# Patient Record
Sex: Male | Born: 1980 | Race: Black or African American | Hispanic: No | Marital: Married | Smoking: Never smoker
Health system: Southern US, Community
[De-identification: ages and names within clinical notes are randomized; demographics above are authoritative.]

## PROBLEM LIST (undated history)

## (undated) DIAGNOSIS — I442 Atrioventricular block, complete: Secondary | ICD-10-CM

## (undated) DIAGNOSIS — G4733 Obstructive sleep apnea (adult) (pediatric): Secondary | ICD-10-CM

## (undated) DIAGNOSIS — Z9889 Other specified postprocedural states: Secondary | ICD-10-CM

## (undated) DIAGNOSIS — G4730 Sleep apnea, unspecified: Secondary | ICD-10-CM

## (undated) DIAGNOSIS — I10 Essential (primary) hypertension: Secondary | ICD-10-CM

## (undated) DIAGNOSIS — I639 Cerebral infarction, unspecified: Secondary | ICD-10-CM

## (undated) HISTORY — PX: FRACTURE SURGERY: SHX138

## (undated) HISTORY — DX: Cerebral infarction, unspecified: I63.9

## (undated) HISTORY — DX: Obstructive sleep apnea (adult) (pediatric): G47.33

## (undated) HISTORY — DX: Sleep apnea, unspecified: G47.30

## (undated) HISTORY — PX: KNEE SURGERY: SHX244

## (undated) HISTORY — DX: Atrioventricular block, complete: I44.2

## (undated) HISTORY — PX: HAND SURGERY: SHX662

---

## 2008-05-10 ENCOUNTER — Inpatient Hospital Stay (HOSPITAL_COMMUNITY): Admission: EM | Admit: 2008-05-10 | Discharge: 2008-05-13 | Payer: Self-pay | Admitting: Emergency Medicine

## 2008-05-10 IMAGING — CR DG HAND COMPLETE 3+V*R*
4 series · 4 of 4 positions shown · non-contrast
Comparison: Right forearm series from the same day.

CLINICAL DATA: 27-year-old male with crush injury to the right
forearm and hand.

RIGHT HAND - COMPLETE 3+ VIEW

[view not recorded (1 of 4)]
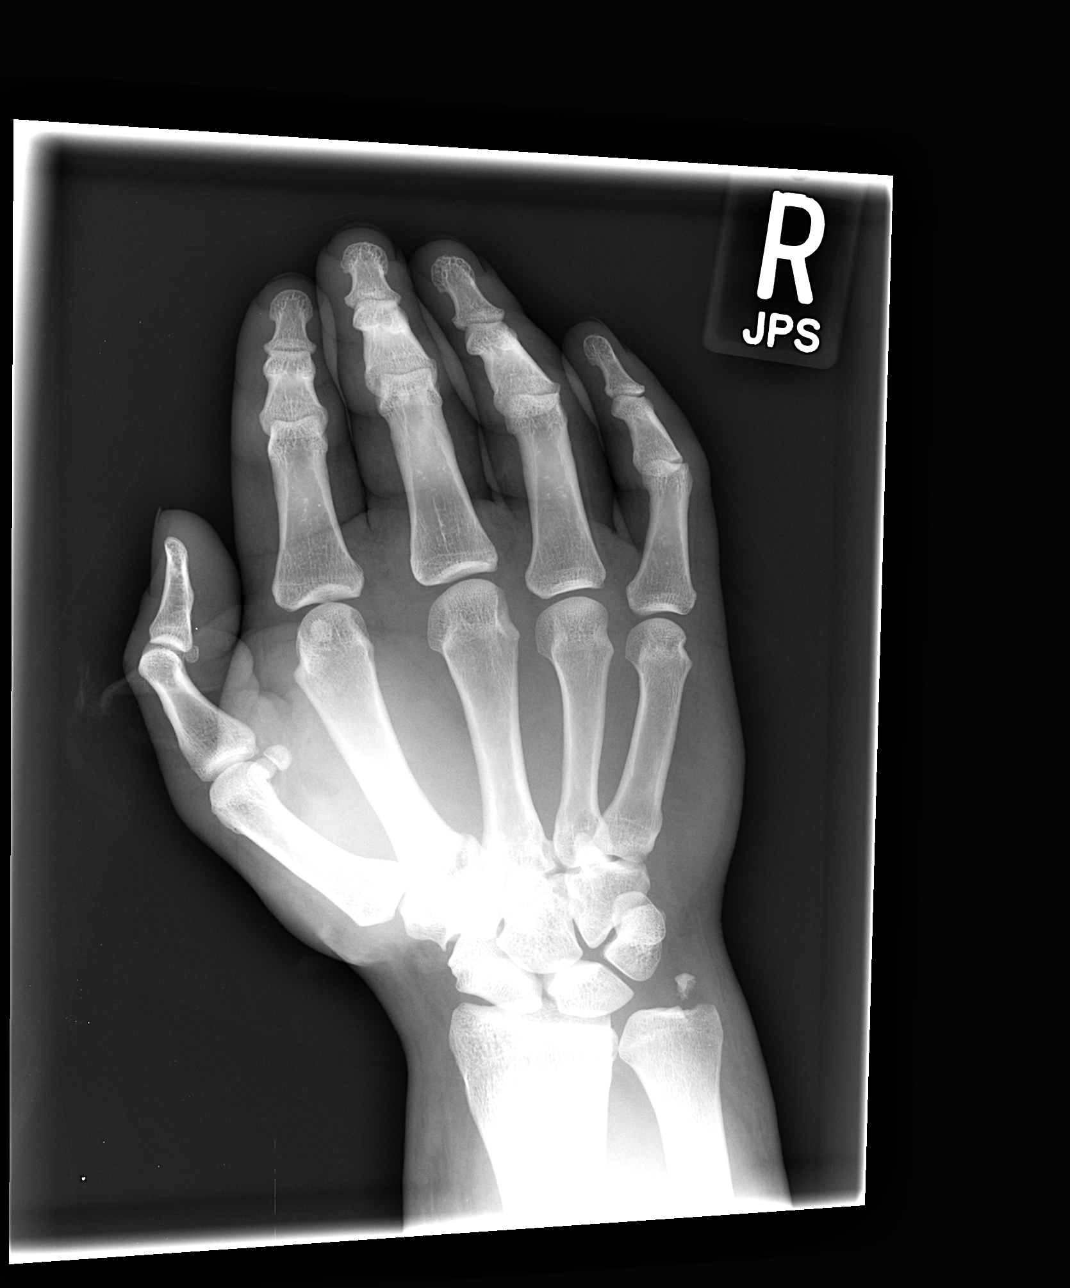

[view not recorded (2 of 4)]
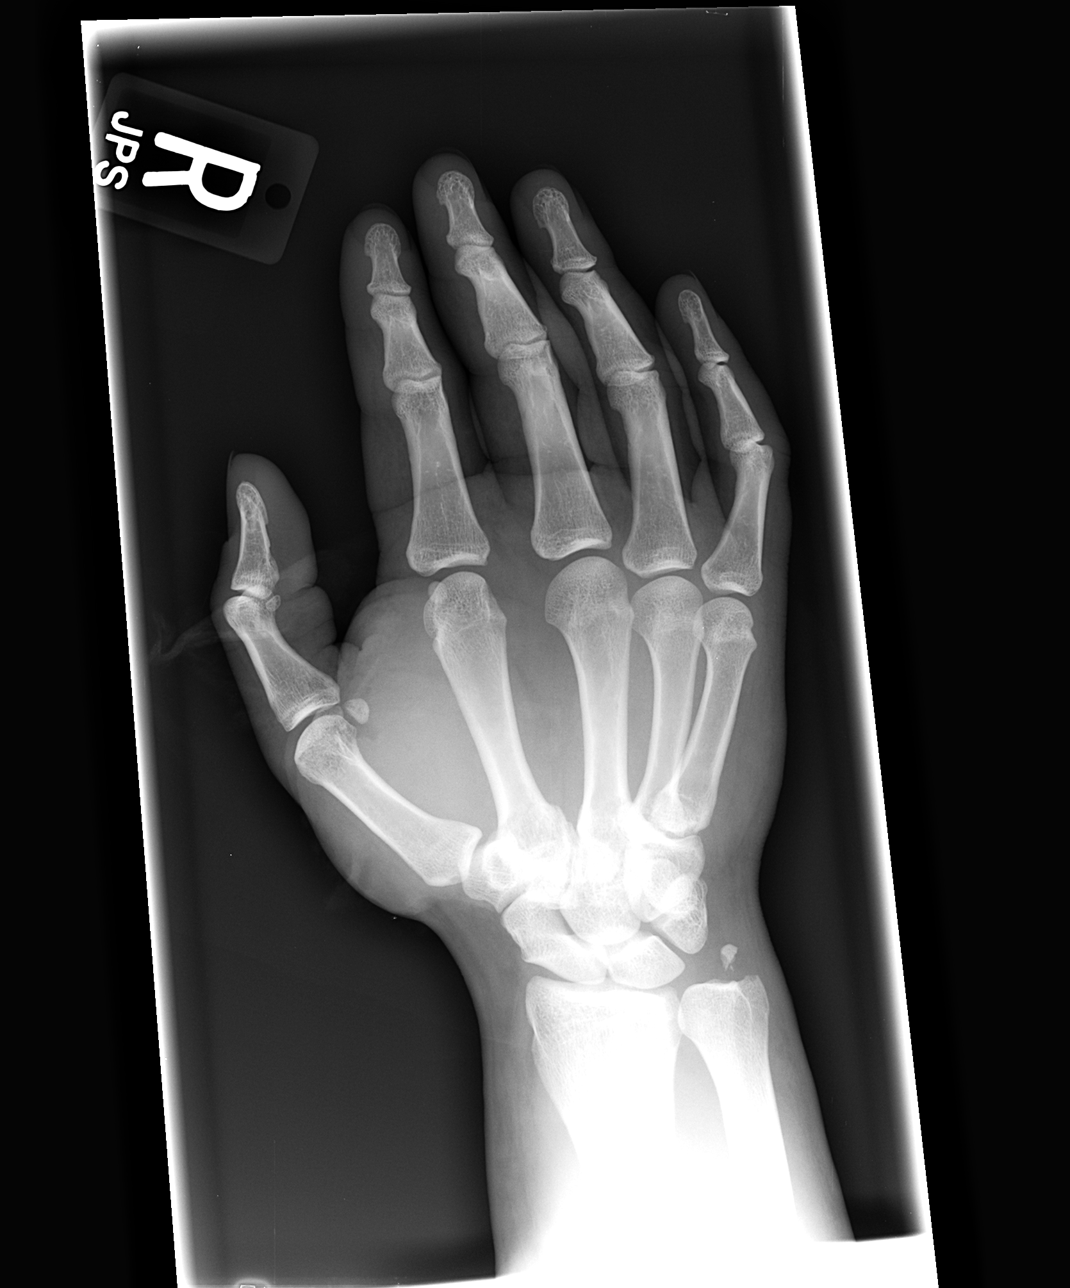

[view not recorded (3 of 4)]
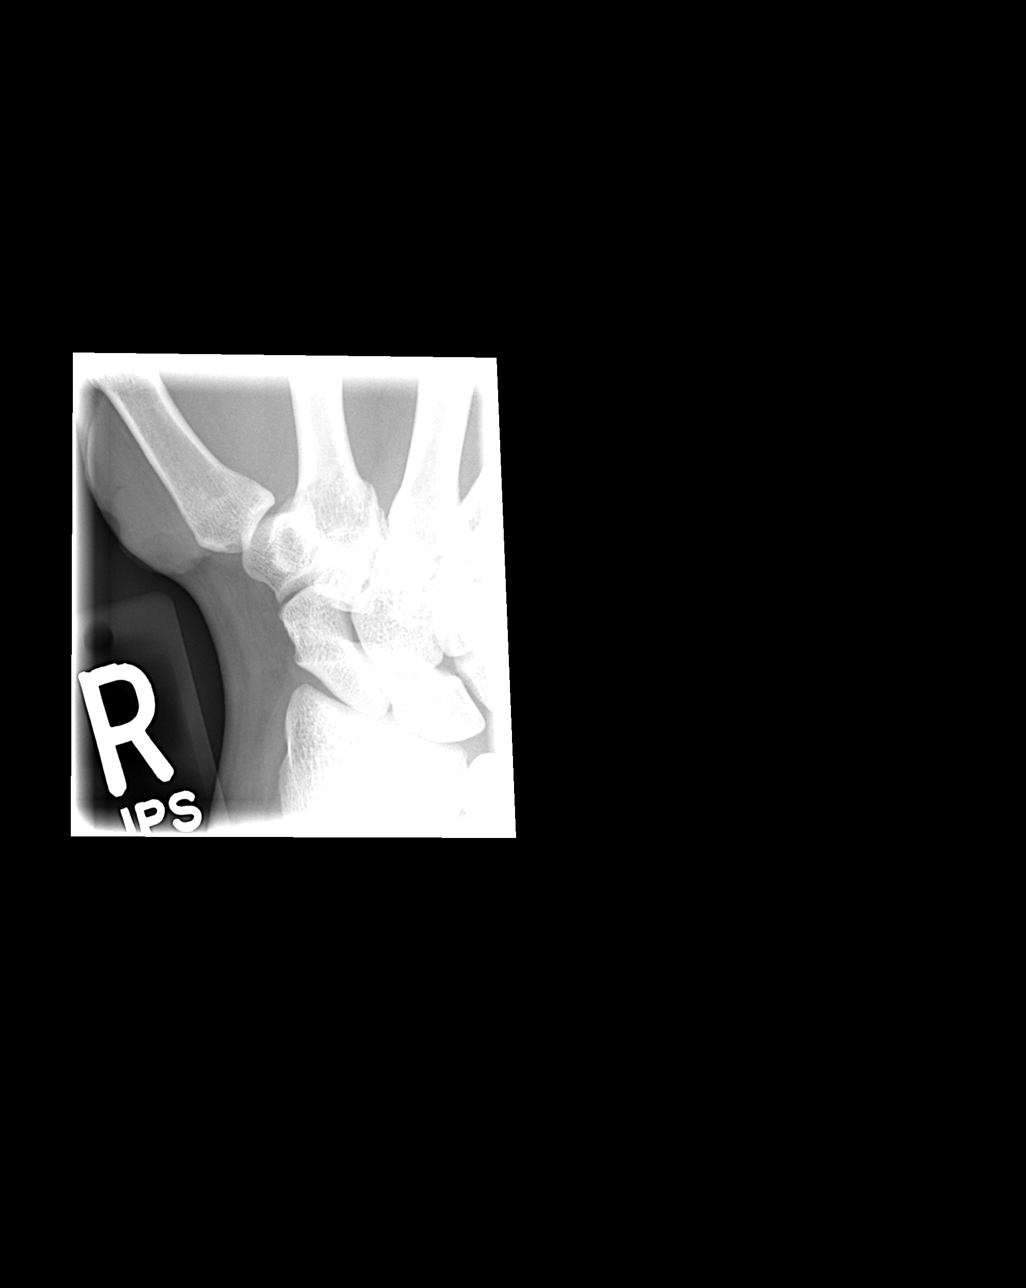

[view not recorded (4 of 4)]
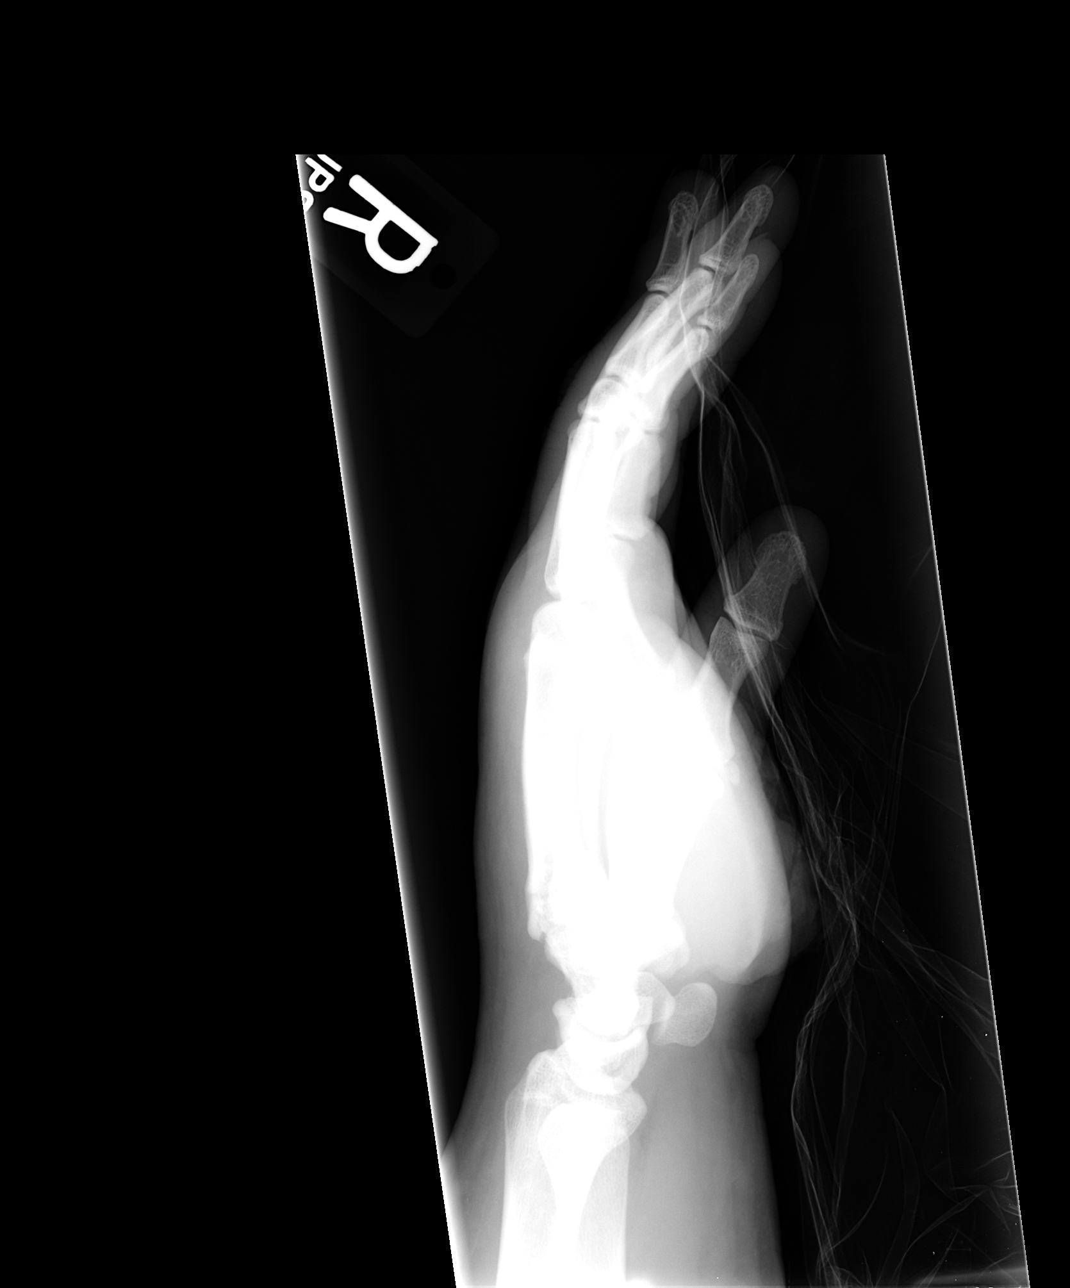

[4 of 4 positions shown; findings below may reference images not displayed]

FINDINGS: The transverse minimally-displaced fracture at the base
of the right second metacarpal is better visualized on the right
forearm series due to the patient's inability to flatten hand for
these images.  The avulsion fracture of the right ulnar styloid is
re-identified.  The scaphoid appears intact.  Carpal bone alignment
is within normal limits.  Positioning the patient phalanges are
suboptimal, but no additional fracture is identified.
IMPRESSION: 1.  Minimally-displaced fracture at the base of the right second
metacarpal, better visualized on the comparison forearm series.
2.  A avulsion fracture of the right ulnar styloid.
3.  No additional fracture identified, positioning is suboptimal.

## 2008-05-10 IMAGING — CR DG FOREARM 2V*R*
2 series · 2 of 2 positions shown · non-contrast
Comparison: Right hand series from the same day.

CLINICAL DATA: 27-year-old male with crush injury to the right arm
and forearm.

RIGHT FOREARM - 2 VIEW

[view not recorded (1 of 2)]
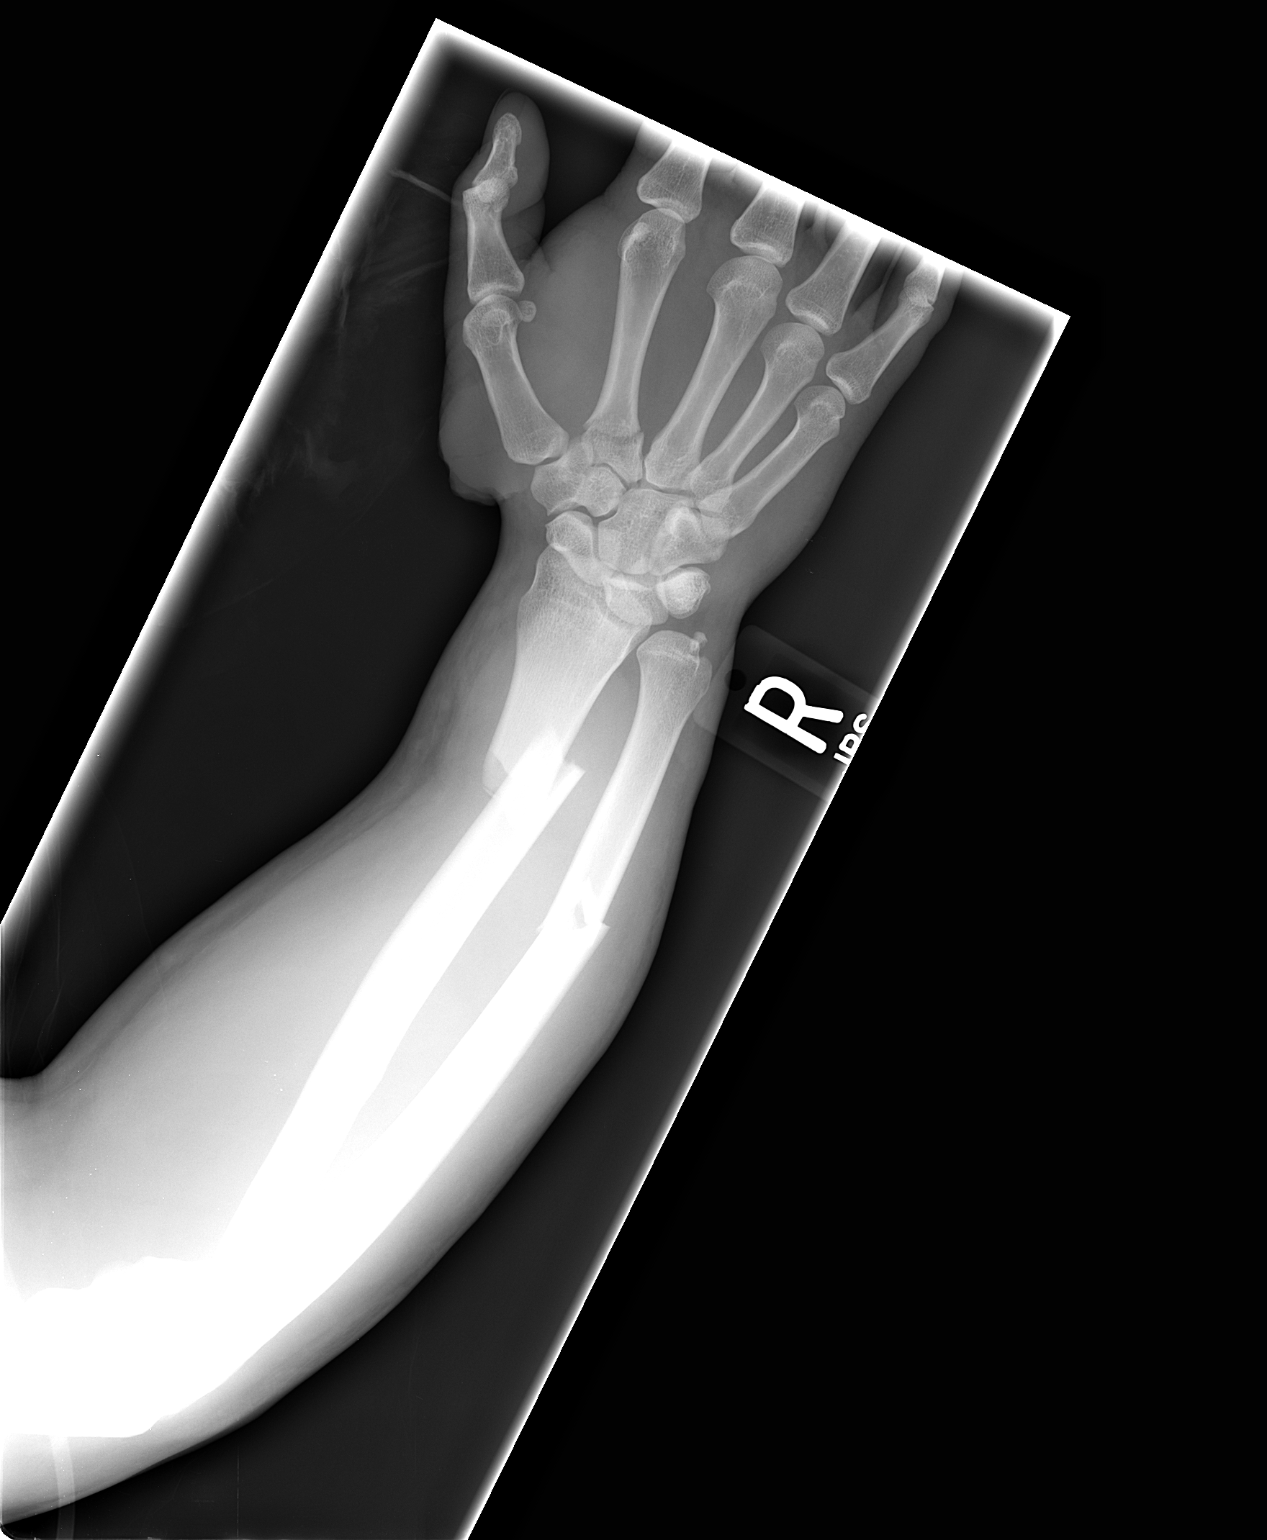

[view not recorded (2 of 2)]
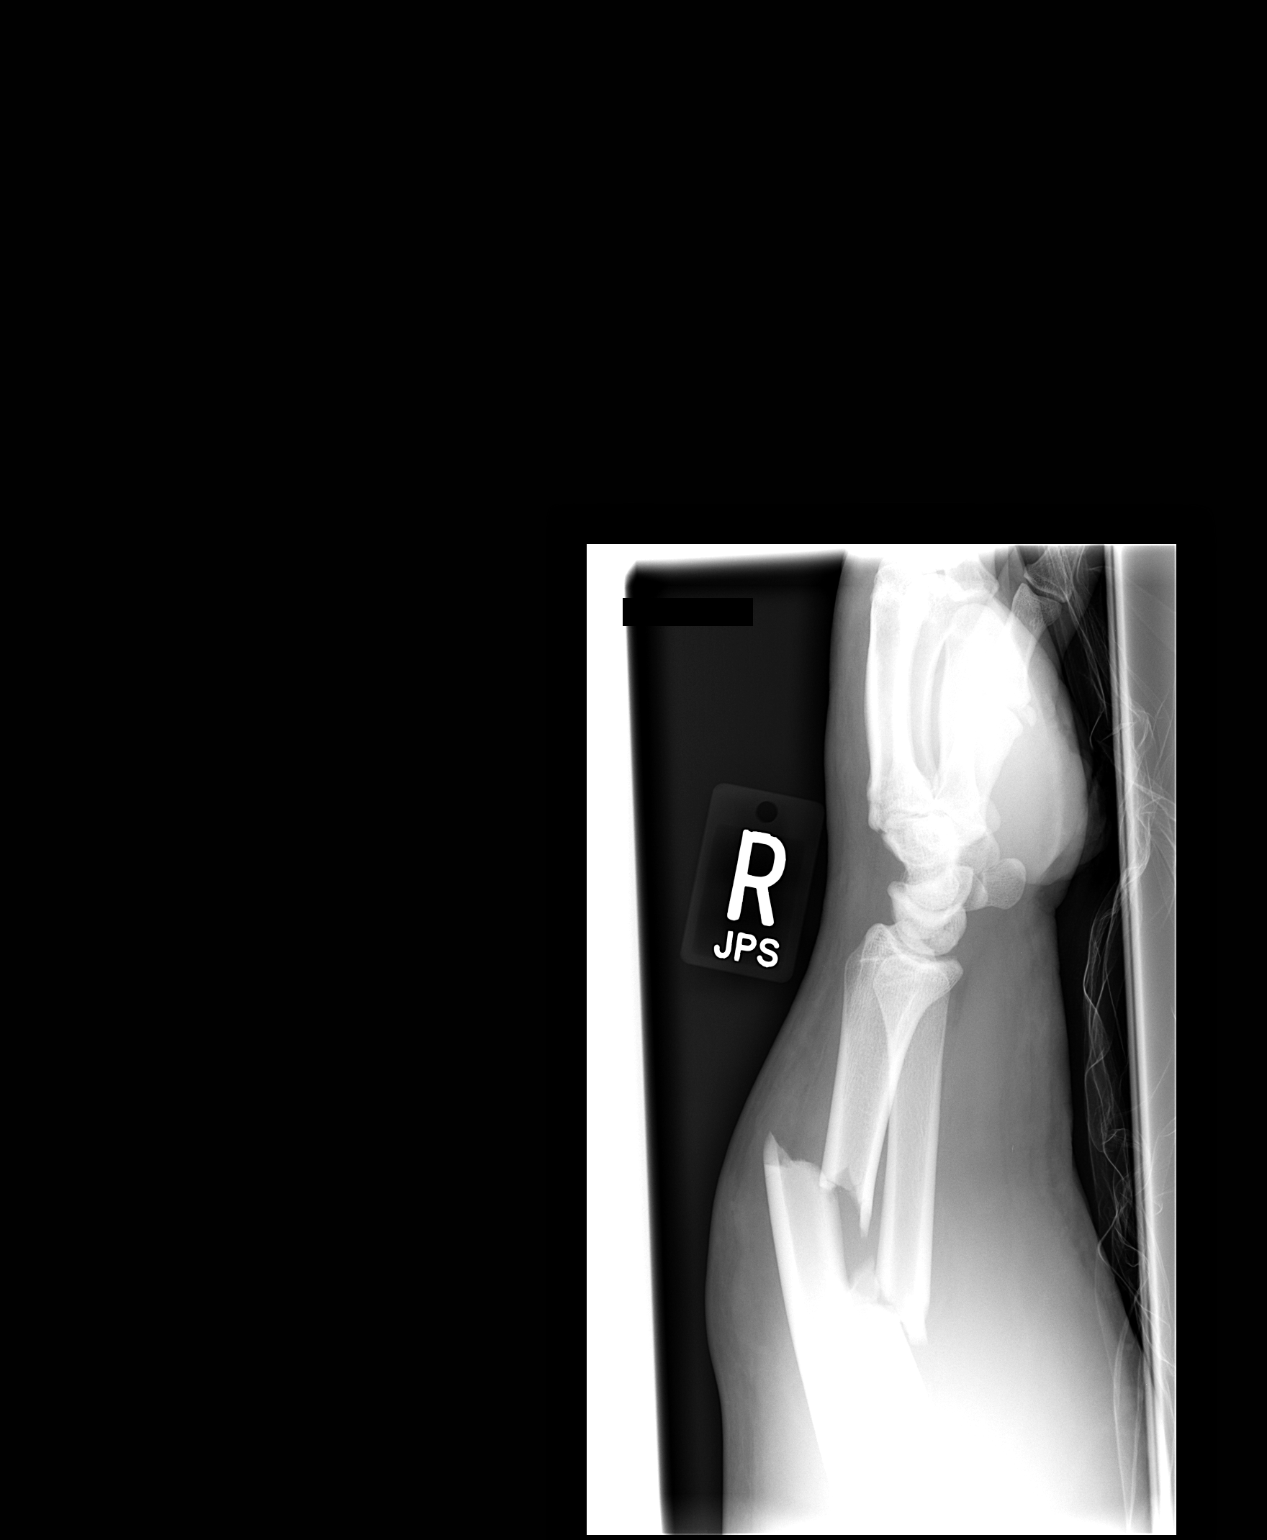

[2 of 2 positions shown; findings below may reference images not displayed]

FINDINGS: Transverse mildly comminuted fractures of the distal one
third right radius and ulnar shaft.  Both fracture show anterior
displacement and angulation.  The radial anterior displacement of
one full shaft width, the ulnar displacement of one half shaft
with.  Both fractures show lateral angulation.  The distal radial
fragment also shows lateral displacement of one half shaft width.

On the PA view it is clear that there is a minimally-displaced
transverse fracture through the base of the right second
metacarpal.  Carpal bone alignment appears grossly normal.  There
is an avulsion fracture of the right ulnar styloid.
IMPRESSION: 1.  Transverse, mildly comminuted distal third shaft right radius
and ulna fractures with displacement and angulation as detailed
above.
2.  Avulsion fracture of the right ulnar styloid.
3.  Minimally-displaced transverse fracture through the base of the
right second metacarpal.

## 2010-08-06 LAB — DIFFERENTIAL
Basophils Relative: 0 % (ref 0–1)
Eosinophils Absolute: 0 10*3/uL (ref 0.0–0.7)
Eosinophils Relative: 0 % (ref 0–5)
Lymphs Abs: 1.2 10*3/uL (ref 0.7–4.0)
Monocytes Absolute: 0.6 10*3/uL (ref 0.1–1.0)
Monocytes Relative: 5 % (ref 3–12)
Neutrophils Relative %: 86 % — ABNORMAL HIGH (ref 43–77)

## 2010-08-06 LAB — BASIC METABOLIC PANEL
BUN: 13 mg/dL (ref 6–23)
CO2: 25 mEq/L (ref 19–32)
Chloride: 104 mEq/L (ref 96–112)
Creatinine, Ser: 1.03 mg/dL (ref 0.4–1.5)
Glucose, Bld: 109 mg/dL — ABNORMAL HIGH (ref 70–99)
Potassium: 3.8 mEq/L (ref 3.5–5.1)

## 2010-08-06 LAB — CBC
HCT: 49.2 % (ref 39.0–52.0)
MCHC: 32.8 g/dL (ref 30.0–36.0)
MCV: 89.2 fL (ref 78.0–100.0)
RBC: 5.51 MIL/uL (ref 4.22–5.81)
WBC: 13.2 10*3/uL — ABNORMAL HIGH (ref 4.0–10.5)

## 2010-08-06 LAB — POCT I-STAT, CHEM 8
BUN: 15 mg/dL (ref 6–23)
Calcium, Ion: 1.14 mmol/L (ref 1.12–1.32)
Creatinine, Ser: 1.1 mg/dL (ref 0.4–1.5)
Glucose, Bld: 111 mg/dL — ABNORMAL HIGH (ref 70–99)
Hemoglobin: 17.7 g/dL — ABNORMAL HIGH (ref 13.0–17.0)
Sodium: 140 mEq/L (ref 135–145)
TCO2: 25 mmol/L (ref 0–100)

## 2010-09-04 NOTE — Op Note (Signed)
NAME:  Alejandro Santos, Alejandro Santos             ACCOUNT NO.:  [id number]   MEDICAL RECORD NO.:  [id number]          PATIENT TYPE:  INP   LOCATION:  5030                         FACILITY:  [facility]   PHYSICIAN:  Dionne Ano. Gramig, M.D.DATE OF BIRTH:  27-Nov-1980   DATE OF PROCEDURE:  05/10/2008  DATE OF DISCHARGE:                               OPERATIVE REPORT   PREOPERATIVE DIAGNOSES:  1. Displaced both-bone forearm fracture, right upper extremity.  2. Open thenar laceration.  3. Index finger metacarpal fracture, right hand.  4. Impending compartment syndrome, right upper extremity.   POSTOPERATIVE DIAGNOSES:  1. Displaced both-bone forearm fracture, right upper extremity.  2. Open thenar laceration.  3. Index finger metacarpal fracture, right hand.  4. Impending compartment syndrome, right upper extremity.   PROCEDURES:  1. Irrigation and debridement of thenar space, skin, subcutaneous      tissue, and muscle.  This was an excisional debridement.  2. Closed reduction of right index finger, metacarpal fracture.  3. Fasciotomy, right forearm.  4. An open reduction and internal fixation, right both-bone forearm      fracture (plate and screw fixation of the radius and ulna).  5. Stress radiography.   SURGEON:  Dionne Ano. Amanda Pea, MD   ASSISTANT:  Karie Chimera, PA-C   COMPLICATIONS:  None.   ANESTHESIA:  General.   TOURNIQUET TIME:  Less than 2 hours.   DRAINS:  None.   INDICATIONS FOR THE PROCEDURE:  The patient is a 31 year old male who  presents with the above-mentioned diagnoses.  He was injured on the job  as detailed in his chart.  He presented with the above-mentioned  abnormalities.  We discussed with him the risks and benefits of surgery  including risk of infection, bleeding, anesthesia, damage to normal  structures, and failure of surgery to accomplish its intended goals or  relieving symptoms, restoring function.  With this in mind, he decided  to proceed.  All questions  have been encouraged and answered  preoperatively.   OPERATIVE PROCEDURE:  The patient was seen by myself by the Anesthesia  and taken to operating suite and underwent a smooth induction of general  anesthesia.  Time-out was called, consent verified, and he was prepped  and draped in the usual sterile fashion with Betadine scrub and paint  about the right upper extremity.  Once this was performed, the patient  had I and D of the thenar space, I and D of skin, subcutaneous tissue,  and muscle was accomplished.  We extended his fasciotomy here about the  hand and placed 6 L of fluid through the area.  This did not appear to  communicate with the second metacarpal fracture.   Following I and D, I then very loosely closed him.  This was an  excisional debridement and was done aggressively with 6 L of saline.  Once this was complete, the drapes were changed, gloves were changed,  and the patient underwent evaluation of the index finger, second  metacarpal fracture, which underwent a closed reduction.  This was done  without difficulty.  I then imaged the wrist.  He had a  ulnar styloid  fracture.  I chose to treat the ulnar styloid fracture in a closed  fashion.   I did not see the instability at the end of the case and the styloid  fracture was quite distal, thus hopefully, we would not have TFC  abnormalities, certainly it will be mindful of this into the future.   I then made a volar-radial approach to the radius.  Dissection was  carried down.  The patient had dissection of the radial artery, FPL,  FCR, and brachioradialis tendons.  Intervals were created appropriately,  and I used a volar Sherilyn Cooter approach sweeping the proximal portion of the  radial artery radially as opposed to ulnarly due to the distal third  nature of the fracture.  I did take down the pronator quadratus.  I did  perform the fasciotomy, of course, of the forearm.  He had good-looking  muscle tissue without signs of  necrosis.  Once superficial and deep  fasciotomy was complete, I accessed the fracture site and performed  irrigation and curettage of the bony ends followed by an 8-hole [employer]  locking plate placement.  I placed this under compression and used 3.5  cortical screws.  I had good compression, excellent apposition, and pre-  bent the plate at the distal end of the radius.   Following this, I then turned attention towards the ulna.  Subcutaneous  incision made off the border of the ulna.  He had a large amount of  adipose tissue.  Dissection was carried down, and the area was  thoroughly identified.  Fracture sites were examined.  He had a  butterfly fragment.  This underwent reduction.  Once this was done, I  applied an 8-hole plate and screw construct under compression similar to  the radius application.  Thus, two 8-hole plates were applied under  compression mode without difficulty.  I then closed the interval, made  sure the fasciotomies were complete, and noted that the compartments  were quite soft.  The hand pinked up nicely and all looked quite well.  At this time, the wound was closed with 3-0 Vicryl at the subcu and a  Prolene in the skin edge.  He was placed in a long-arm splint carefully  protecting the index metacarpal, which was imaged at the conclusion of  the case.   I did perform stress radiography throughout the case to verify my  reduction and was pleased with this.   Thus, he underwent ORIF of both-bone forearm fracture, closed reduction  of right index finger metacarpal fracture, closed treatment of an ulnar  styloid fracture, I and D of thenar space, skin, subcutaneous tissue,  and muscle, and fasciotomy as well as stress radiography verifying  correct placement of the fractures after treatment.  He will be  monitored closely.  We will plan for Ancef times 24-48 hours and watch  him closely.  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  Should any  problems occur, he is to notify me.   It has been a pleasure to participate in his care.  I look forward to  participate in his postop recovery.      Dionne Ano. Amanda Pea, M.D.  Electronically Signed     WMG/MEDQ  D:  05/10/2008  T:  05/11/2008  Job:  [id number]

## 2010-09-07 NOTE — Discharge Summary (Signed)
NAME:  Alejandro Santos, Alejandro Santos             ACCOUNT NO.:  [id number]   MEDICAL RECORD NO.:  [id number]          PATIENT TYPE:  INP   LOCATION:  5030                         FACILITY:  [facility]   PHYSICIAN:  Dionne Ano. Gramig, M.D.DATE OF BIRTH:  June 25, 1980   DATE OF ADMISSION:  05/10/2008  DATE OF DISCHARGE:  05/13/2008                               DISCHARGE SUMMARY   ADMITTING DIAGNOSES:  1. Both bone forearm fracture, right upper extremity.  2. Right ulnar styloid fracture.  3. Right second metacarpal nondisplaced fracture.   DISCHARGE DIAGNOSES:  1. Both bone forearm fracture, right upper extremity.  2. Right ulnar styloid fracture.  3. Right second metacarpal nondisplaced fracture, improved.   SURGEON:  Dionne Ano. Amanda Pea, M.D.   CONSULTS:  None.   PROCEDURES:  1. Open reduction and internal fixation, right both bone forearm      fracture.  2. Closed treatment of right ulnar styloid fracture.  3. Fasciotomy, right forearm.  4. Closed treatment of right index finger metacarpal fracture.  5. I and D of thenar space.  6. Stress radiography.   BRIEF HISTORY OF PRESENT ILLNESS:  Mr. Shaul is a very pleasant  gentleman, 30 years of age, who presented to the [facility] Emergency  Room on May 10, 2008, for evaluation of his right upper extremity.  We were consulted per Dr. Freida Busman given the significant injury this young  gentleman sustained to his right upper extremity.  He had sustained on-  the-job injury in which the hand and forearm was caught between a belt  roller.  He presented with significant pain, soft tissue swelling,  deformity and exposed muscle to the whole forearm.  He was noted to have  displaced both bone forearm fracture, ulnar styloid fracture, and second  metacarpal fracture as well as soft tissue disarray.  For these reasons  we discussed urgently proceeding to the operating room.  The patient's  vital signs were stable.  He was afebrile.  Preoperative laboratory  data  was reviewed and cleared.  Thus, he was taken to the operative suite.   Mr. Teare was admitted on May 10, 2008 after being seen and  evaluated in the emergency room setting.  He was taken to the operative  suite and underwent the above procedure.  Please see further details in  the operative report.  There were no complications.  He tolerated it  well.  He was admitted to the surgical unit and started on a standard  regime of postoperative IV medication, antibiotic, elevation, close  observation along with physical and occupational therapy.   On postoperative day #1, he was doing fairly well.  He was  neurovascularly intact to the upper extremities.  The splint was clean  and intact.  Radial, ulnar and median nerves appear intact.  He had good  refill.  His PCA Dilaudid was discontinued, increasing his oral opioids.  Occupational Therapy consult was performed.  He was noted to be fairly  independent with all of his ADLs and transfers.  On postoperative day  #2, he was out of the splint, doing fairly well, and his IV  NSAID was  discontinued.  He did have some slight numbness in his right middle and  index finger, otherwise no complaints.  He was tolerating p.o. and  voiding without difficulty.  He remained stable in regard to his upper  extremity examination without any changes.  His chest was clear to  auscultation.  Abdomen was soft and nontender.   On May 13, 2008, he was doing quite well.  His vital signs were  stable.  He was afebrile.  Dressings were changed at the bedside.  He  was noted to be neurovascularly intact.  No signs of acute compartment  syndrome effects were noted.  He had an excellent range of motion to the  middle, index, ring and small finger, FDS, FTP, __________ and EDC were  noted to be functioning.  He was tolerating p.o.  He denied any  significant nausea.  At that time, his wounds looked good overall.  Abdomen was soft, nontender.  Chest was  clear to auscultation.  Heart  regular rate and rhythm.  Hence, decision was made to discharge him home  in stable condition.   ASSESSMENT/FINAL DIAGNOSES:  Please see discharge diagnoses.   DISCHARGE PLAN:  1. Condition on discharge is improved.  2. He will resume a regular diet.  3. Activity.  He will keep the upper extremity clean, dry, and intact.      He will wear a sling when ambulatory.  He will elevate the upper      extremity on 2-3 pillows and move his fingers frequently each hour.   DISCHARGE MEDICATION:  His discharge medications will include:  1. Dilaudid 2 mg 1 to 2 p.o. q.4 h. p.r.n.  2. Robaxin 500 mg 1 p.o. q.6 h. p.r.n. spasm.  3. Vitamin C 1000 mg a day.  4. Colace 1 p.o. b.i.d..  5. Keflex 500 mg 1 p.o. q.i.d. for 7 days.   FOLLOWUP:  He will follow up with Dr. Amanda Pea in a week to 10 days for  wound check, suture removal, and repeat radiographs out of the splint.  He will contact our office at [facility] at [phone]  for any questions or concerns that may arise during his postoperative  condition.  All questions were encouraged and answered.      Karie Chimera, P.A.-C.      Dionne Ano. Amanda Pea, M.D.  Electronically Signed    BB/MEDQ  D:  06/16/2008  T:  06/16/2008  Job:  [id number]

## 2015-05-01 ENCOUNTER — Ambulatory Visit (INDEPENDENT_AMBULATORY_CARE_PROVIDER_SITE_OTHER): Payer: 59

## 2015-05-01 ENCOUNTER — Encounter: Payer: Self-pay | Admitting: Gynecology

## 2015-05-01 ENCOUNTER — Ambulatory Visit
Admission: EM | Admit: 2015-05-01 | Discharge: 2015-05-01 | Disposition: A | Discharge status: Home / Self Care | Payer: 59 | Attending: Family Medicine | Admitting: Family Medicine

## 2015-05-01 DIAGNOSIS — S20211A Contusion of right front wall of thorax, initial encounter: Secondary | ICD-10-CM

## 2015-05-01 IMAGING — CR DG RIBS W/ CHEST 3+V*R*
5 series · 6 of 6 positions shown · non-contrast
Comparison: None.

CLINICAL DATA: Right rib pain after fall yesterday.

EXAM:
RIGHT RIBS AND CHEST - 3+ VIEW

[chest pa]
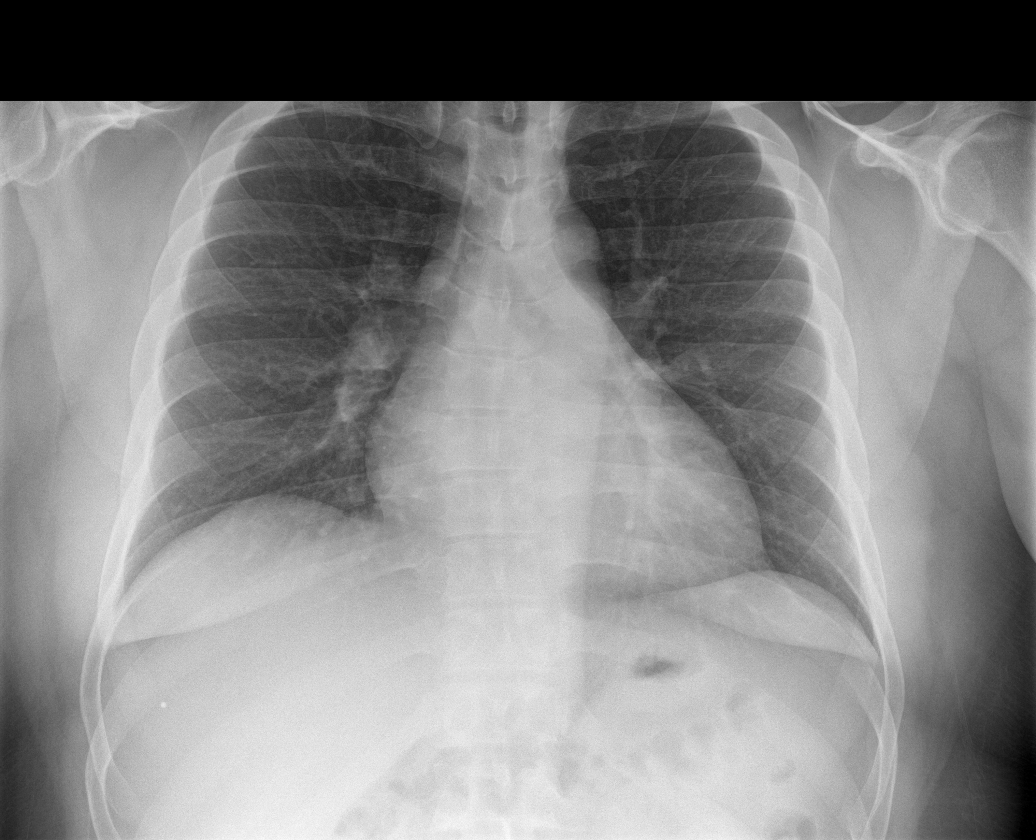

[rib pa (1 of 2)]
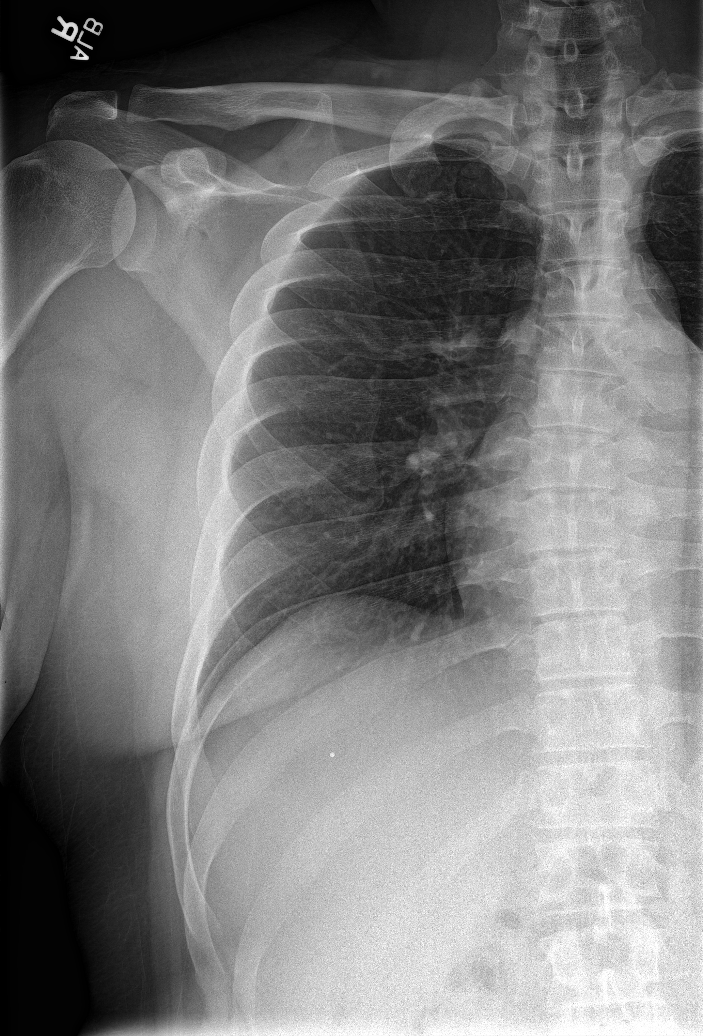

[rib pa (2 of 2)]
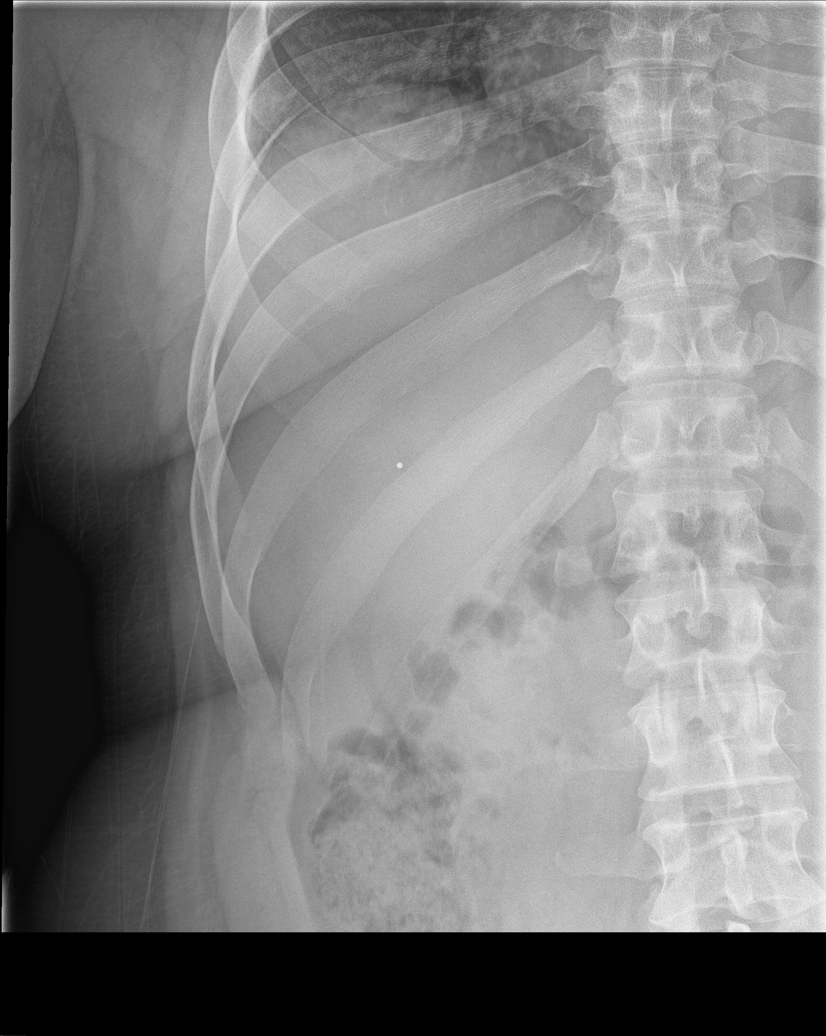

[rib obl (1 of 2)]
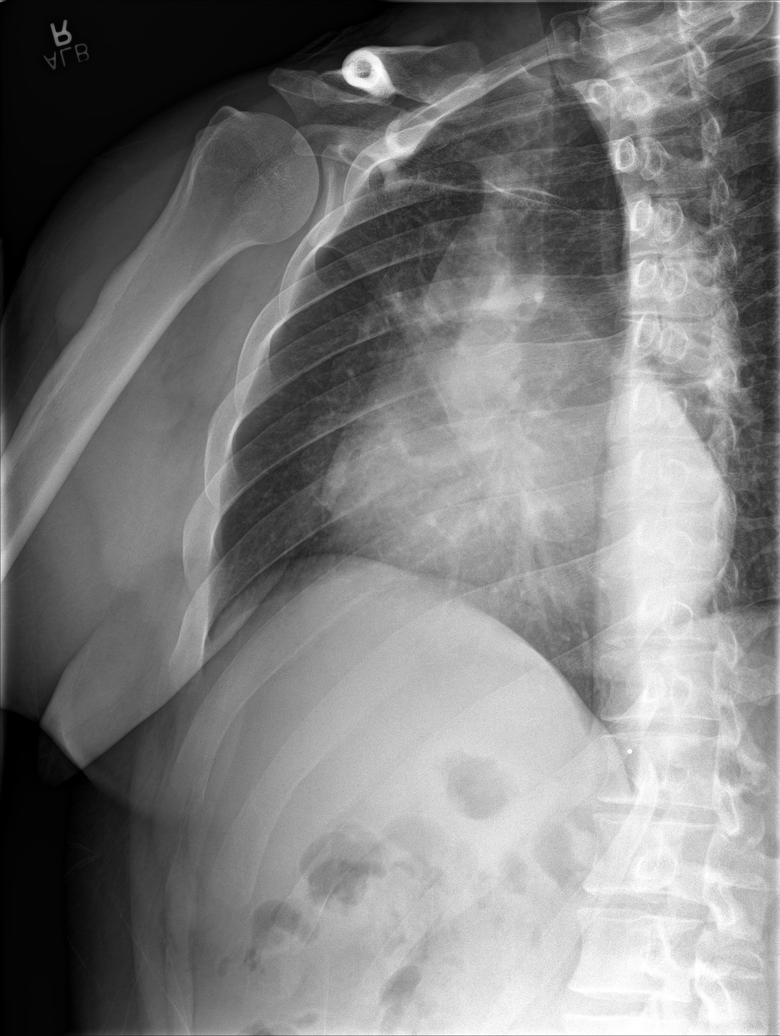

[Series 5: rib obl · 0.14mm/px · 2 of 2 slices shown (2 of 2)]
[im 1/2]
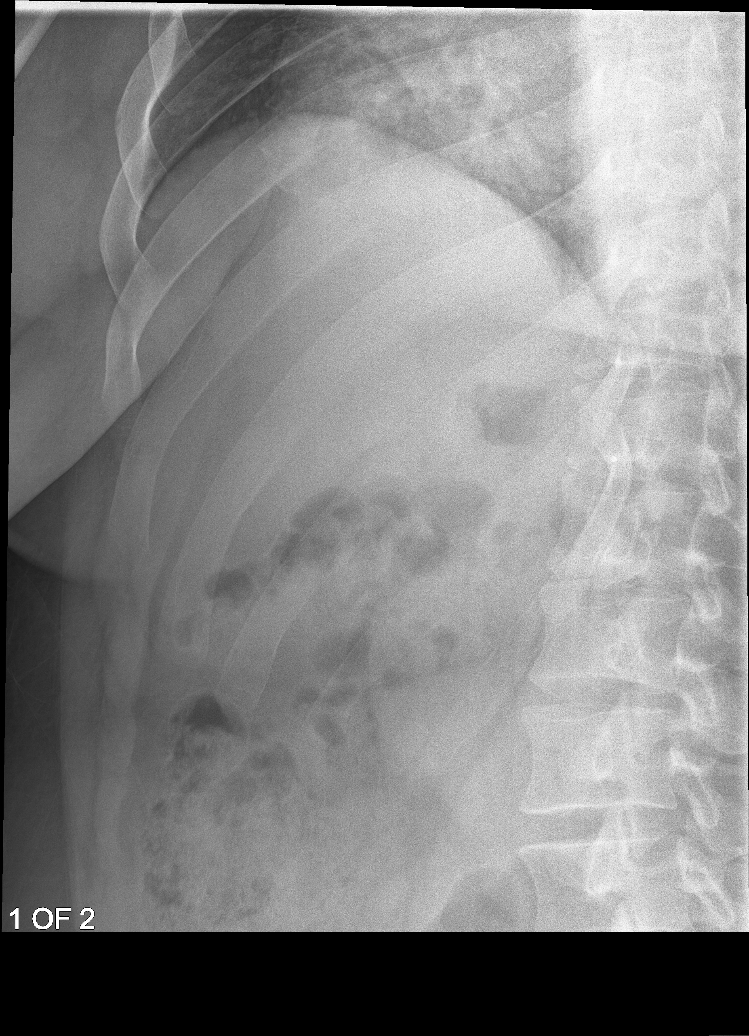
[im 2/2]
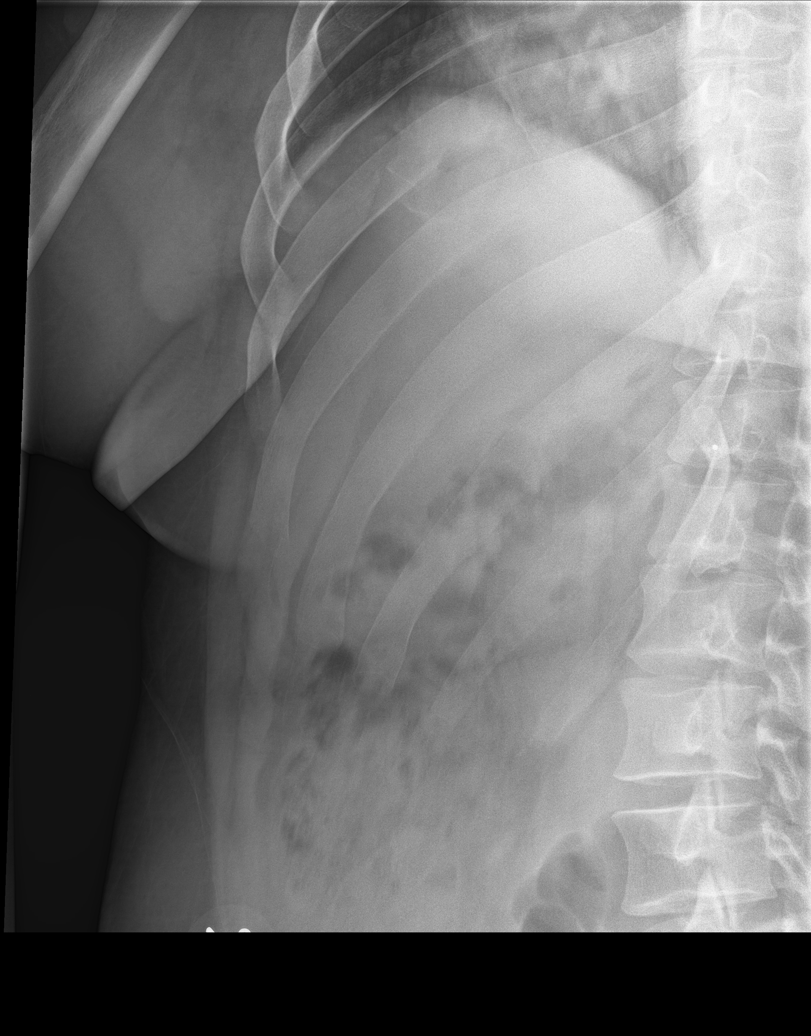

[6 of 6 positions shown; findings below may reference images not displayed]

FINDINGS: No fracture or other bone lesions are seen involving the ribs. There
is no evidence of pneumothorax or pleural effusion. Both lungs are
clear. Heart size and mediastinal contours are within normal limits.
IMPRESSION: Normal right ribs.  No acute cardiopulmonary abnormality seen.

## 2015-05-01 MED ORDER — HYDROCODONE-ACETAMINOPHEN 5-325 MG PO TABS: 1.0000 | ORAL_TABLET | Freq: Four times a day (QID) | ORAL | Status: DC | PRN

## 2015-05-01 NOTE — ED Provider Notes (Signed)
CSN: [id number]     Arrival date & time 05/01/15  1643 History   First MD Initiated Contact with Patient 05/01/15 1830     Chief Complaint  Patient presents with  . Back Pain   (Consider location/radiation/quality/duration/timing/severity/associated sxs/prior Treatment) HPI Comments: 35 yo male with a c/o right chest wall/rib pain after falling on ice yesterday. States landed on right chest area; pain is worse with deep breaths. Denies hitting his head or loss of consciousness.  The history is provided by the patient.    History reviewed. No pertinent past medical history. Past Surgical History  Procedure Laterality Date  . Hand surgery Right     right arm   No family history on file. Social History  Substance Use Topics  . Smoking status: Never Smoker   . Smokeless tobacco: None  . Alcohol Use: No    Review of Systems  Allergies  Review of patient's allergies indicates no known allergies.  Home Medications   Prior to Admission medications   Medication Sig Start Date End Date Taking? Authorizing Provider  HYDROcodone-acetaminophen (NORCO/VICODIN) 5-325 MG tablet Take 1-2 tablets by mouth every 6 (six) hours as needed. 05/01/15   Payton Mccallumrlando Aldora Perman, MD   Meds Ordered and Administered this Visit  Medications - No data to display  BP 146/109 mmHg  Pulse 85  Temp(Src) 97.9 F (36.6 C) (Oral)  Resp 18  Ht 5\' 8"  (1.727 m)  Wt 262 lb (118.842 kg)  BMI 39.85 kg/m2  SpO2 100% No data found.   Physical Exam  Constitutional: He appears well-developed and well-nourished. No distress.  Neck: Normal range of motion. Neck supple. No tracheal deviation present.  Cardiovascular: Normal rate, regular rhythm, normal heart sounds and intact distal pulses.   No murmur heard. Pulmonary/Chest: Effort normal and breath sounds normal. No stridor. No respiratory distress. He has no wheezes. He has no rales. He exhibits tenderness (over the right lateral mid chest wall; no step-off).   Neurological: He is alert.  Skin: He is not diaphoretic.  Nursing note and vitals reviewed.   ED Course  Procedures (including critical care time)  Labs Review Labs Reviewed - No data to display  Imaging Review Dg Ribs Unilateral W/chest Right  05/01/2015  CLINICAL DATA:  Right rib pain after fall yesterday. EXAM: RIGHT RIBS AND CHEST - 3+ VIEW COMPARISON:  None. FINDINGS: No fracture or other bone lesions are seen involving the ribs. There is no evidence of pneumothorax or pleural effusion. Both lungs are clear. Heart size and mediastinal contours are within normal limits. IMPRESSION: Normal right ribs.  No acute cardiopulmonary abnormality seen. Electronically Signed   By: Lupita RaiderJames  Green Jr, M.D.   On: 05/01/2015 19:08     Visual Acuity Review  Right Eye Distance:   Left Eye Distance:   Bilateral Distance:    Right Eye Near:   Left Eye Near:    Bilateral Near:         MDM   1. Chest wall contusion, right, initial encounter    Discharge Medication List as of 05/01/2015  7:29 PM    START taking these medications   Details  HYDROcodone-acetaminophen (NORCO/VICODIN) 5-325 MG tablet Take 1-2 tablets by mouth every 6 (six) hours as needed., Starting 05/01/2015, Until Discontinued, Print       1. x-ray results (negative for fracture) and diagnosis reviewed with patient 2. rx as per orders above; reviewed possible side effects, interactions, risks and benefits  3. Recommend supportive treatment with otc  analgesics/NSAIDs, rest, ice 4. Follow-up prn if symptoms worsen or don't improve    Payton Mccallum, MD 05/01/15 2118

## 2015-05-01 NOTE — ED Notes (Signed)
Patient stated fell yesterday at home and injury right lower back. Per patient hurt to lift arm up and hurts to take deep breaths.

## 2016-09-14 ENCOUNTER — Emergency Department
Admission: EM | Admit: 2016-09-14 | Discharge: 2016-09-15 | Disposition: A | Discharge status: Home / Self Care | Payer: 59 | Attending: Emergency Medicine | Admitting: Emergency Medicine

## 2016-09-14 ENCOUNTER — Encounter: Payer: Self-pay | Admitting: Emergency Medicine

## 2016-09-14 ENCOUNTER — Emergency Department: Payer: 59

## 2016-09-14 DIAGNOSIS — R310 Gross hematuria: Secondary | ICD-10-CM | POA: Diagnosis not present

## 2016-09-14 DIAGNOSIS — R339 Retention of urine, unspecified: Secondary | ICD-10-CM | POA: Insufficient documentation

## 2016-09-14 DIAGNOSIS — R319 Hematuria, unspecified: Secondary | ICD-10-CM | POA: Diagnosis present

## 2016-09-14 DIAGNOSIS — R300 Dysuria: Secondary | ICD-10-CM | POA: Insufficient documentation

## 2016-09-14 DIAGNOSIS — N3090 Cystitis, unspecified without hematuria: Secondary | ICD-10-CM

## 2016-09-14 DIAGNOSIS — N3091 Cystitis, unspecified with hematuria: Secondary | ICD-10-CM | POA: Diagnosis not present

## 2016-09-14 LAB — URINALYSIS, COMPLETE (UACMP) WITH MICROSCOPIC
Bilirubin Urine: NEGATIVE
Glucose, UA: NEGATIVE mg/dL
Ketones, ur: NEGATIVE mg/dL
Leukocytes, UA: NEGATIVE
Nitrite: NEGATIVE
PROTEIN: 100 mg/dL — AB
SPECIFIC GRAVITY, URINE: 1.017 (ref 1.005–1.030)
SQUAMOUS EPITHELIAL / LPF: NONE SEEN
pH: 6 (ref 5.0–8.0)

## 2016-09-14 MED ORDER — LIDOCAINE HCL 2 % EX GEL
1.0000 | Freq: Once | CUTANEOUS | Status: AC
Start: 2016-09-14 — End: 2016-09-15
  Administered 2016-09-15: 1 via URETHRAL

## 2016-09-14 MED ORDER — LIDOCAINE HCL 2 % EX GEL
CUTANEOUS | Status: AC
  Filled 2016-09-14: qty 10

## 2016-09-14 MED ORDER — SODIUM CHLORIDE 0.9 % IV BOLUS (SEPSIS)
1000.0000 mL | Freq: Once | INTRAVENOUS | Status: AC
  Administered 2016-09-14: 1000 mL via INTRAVENOUS

## 2016-09-14 NOTE — ED Notes (Signed)
Pt. States he last urinated this a.m.  Pt. Reports clear normal urine at that time.  Pt. Denies hx of same.  Pt. Reports no change in medication.  Pt. Denies injury.  Pt. States pain in sub-pubic area.

## 2016-09-14 NOTE — ED Notes (Signed)
First attempt at placement of foley catheter not successful.  Pt. Had red tinted urine that was flowing past foley catheter.  Pt. Able to urinate around 450 ml red tinted urine.

## 2016-09-14 NOTE — ED Triage Notes (Signed)
Patient with complaint of pain in his lower abdomin, unable to urinate and passing dark red blood with clots that started this morning. Patient diaphoretic in triage.

## 2016-09-15 ENCOUNTER — Emergency Department: Payer: 59

## 2016-09-15 LAB — CBC WITH DIFFERENTIAL/PLATELET
Basophils Absolute: 0.1 10*3/uL (ref 0–0.1)
Basophils Relative: 1 %
EOS PCT: 0 %
Eosinophils Absolute: 0 10*3/uL (ref 0–0.7)
HCT: 44.2 % (ref 40.0–52.0)
HEMOGLOBIN: 15.1 g/dL (ref 13.0–18.0)
LYMPHS ABS: 1.7 10*3/uL (ref 1.0–3.6)
LYMPHS PCT: 14 %
MCH: 28.8 pg (ref 26.0–34.0)
MCHC: 34.2 g/dL (ref 32.0–36.0)
MCV: 84.1 fL (ref 80.0–100.0)
Monocytes Absolute: 0.7 10*3/uL (ref 0.2–1.0)
Monocytes Relative: 6 %
Neutro Abs: 9.2 10*3/uL — ABNORMAL HIGH (ref 1.4–6.5)
Neutrophils Relative %: 79 %
Platelets: 416 10*3/uL (ref 150–440)
RBC: 5.26 MIL/uL (ref 4.40–5.90)
RDW: 14 % (ref 11.5–14.5)
WBC: 11.7 10*3/uL — AB (ref 3.8–10.6)

## 2016-09-15 LAB — COMPREHENSIVE METABOLIC PANEL
ALBUMIN: 4.5 g/dL (ref 3.5–5.0)
ALT: 28 U/L (ref 17–63)
AST: 32 U/L (ref 15–41)
Alkaline Phosphatase: 60 U/L (ref 38–126)
Anion gap: 11 (ref 5–15)
BUN: 18 mg/dL (ref 6–20)
CHLORIDE: 103 mmol/L (ref 101–111)
CO2: 24 mmol/L (ref 22–32)
Calcium: 9.3 mg/dL (ref 8.9–10.3)
Creatinine, Ser: 1.58 mg/dL — ABNORMAL HIGH (ref 0.61–1.24)
GFR calc Af Amer: >60 mL/min (ref >60)
GFR, EST NON AFRICAN AMERICAN: 55 mL/min — AB (ref >60)
Glucose, Bld: 127 mg/dL — ABNORMAL HIGH (ref 65–99)
POTASSIUM: 3.8 mmol/L (ref 3.5–5.1)
SODIUM: 138 mmol/L (ref 135–145)
Total Bilirubin: 1.2 mg/dL (ref 0.3–1.2)
Total Protein: 8.3 g/dL — ABNORMAL HIGH (ref 6.5–8.1)

## 2016-09-15 IMAGING — CT CT RENAL STONE PROTOCOL
2 of 4 series · 16 of 46 positions shown, 18 images · non-contrast
Comparison: None.

CLINICAL DATA: Lower abdominal pain. Passing dark red blood.
Urinary retention. Difficulty urinating.

EXAM:
CT ABDOMEN AND PELVIS WITHOUT CONTRAST
TECHNIQUE: Multidetector CT imaging of the abdomen and pelvis was performed
following the standard protocol without IV contrast.

[Series 2: stone full standard · axial · 0.81mm/px · z∈[-489,+1]mm · 13 of 108 slices shown, 15 images]
[im 5/108  soft-tissue]
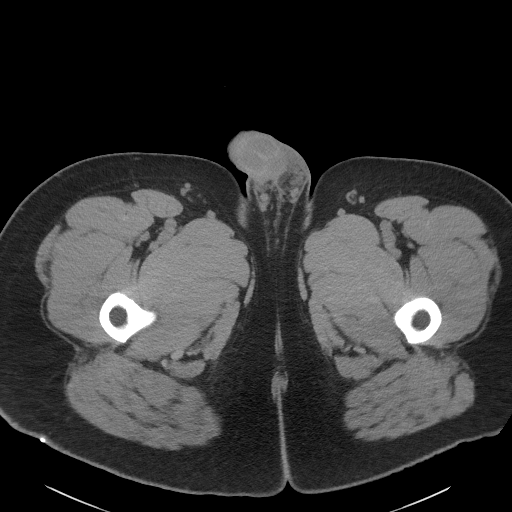
[im 5/108  bone]
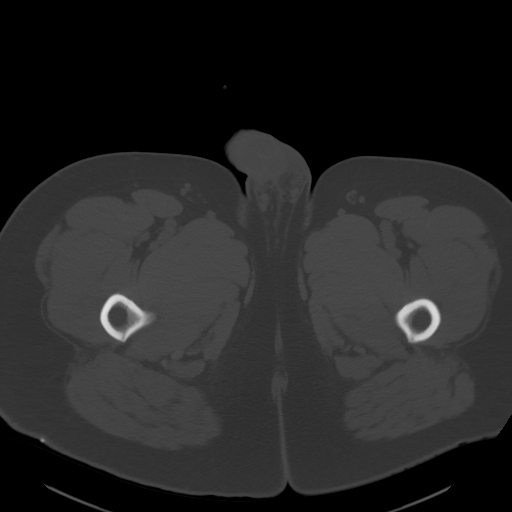
[im 14/108  soft-tissue]
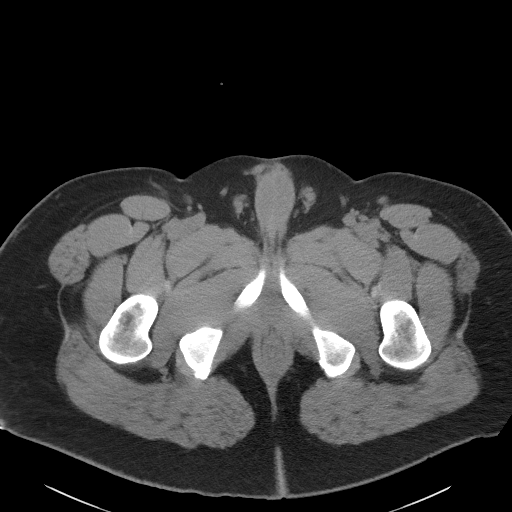
[im 24/108  soft-tissue]
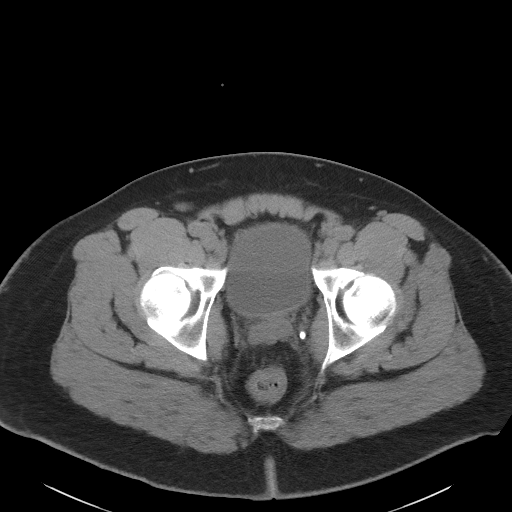
[im 28/108  soft-tissue]
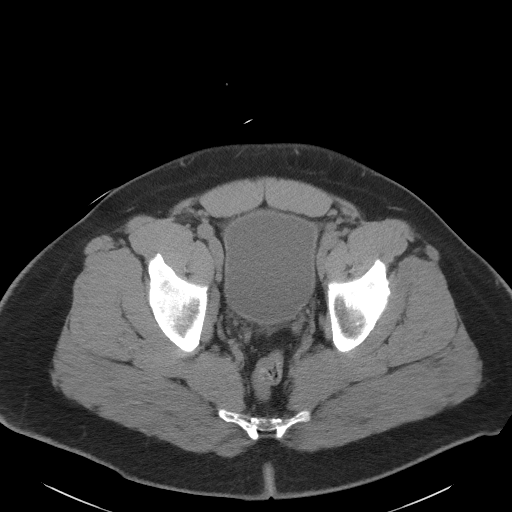
[im 38/108  soft-tissue]
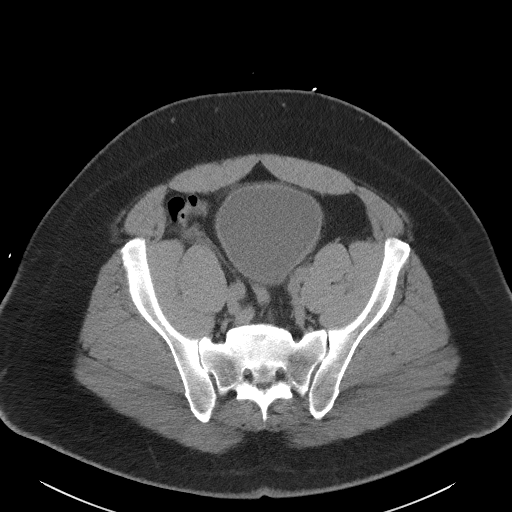
[im 47/108  soft-tissue]
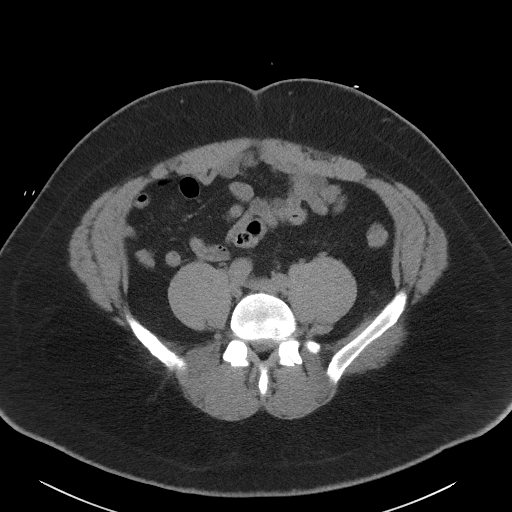
[im 56/108  soft-tissue]
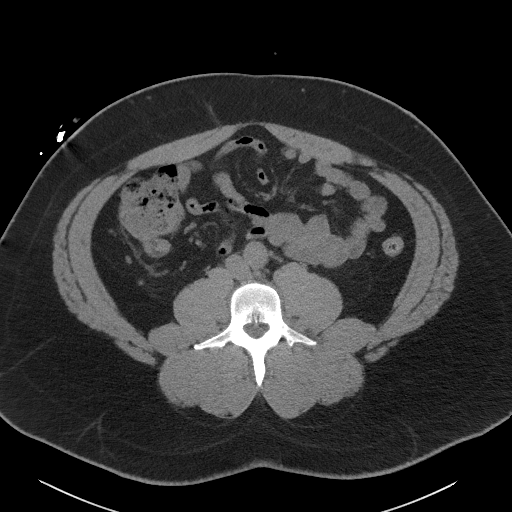
[im 61/108  soft-tissue]
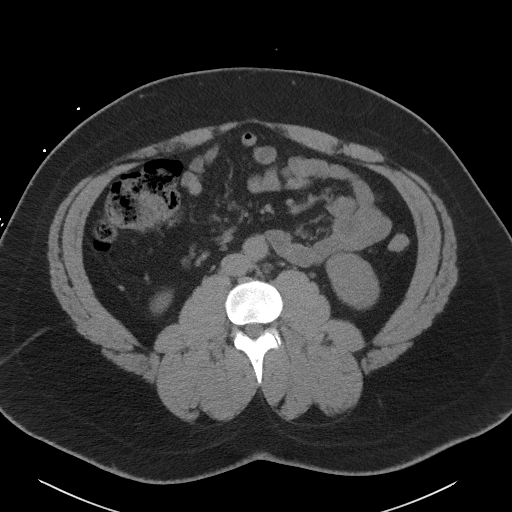
[im 70/108  soft-tissue]
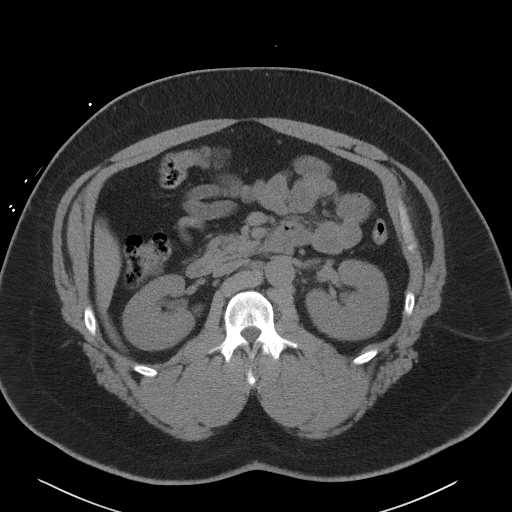
[im 70/108  bone]
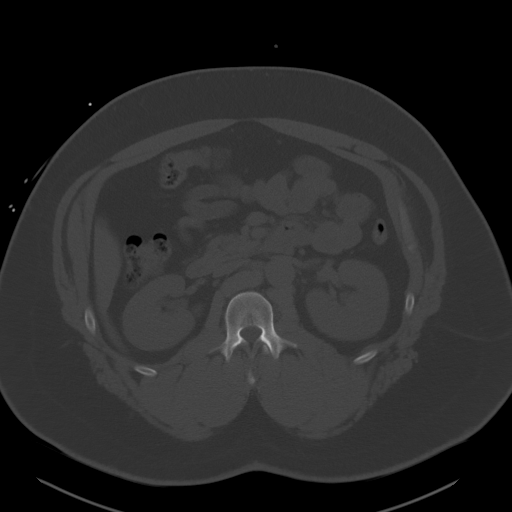
[im 80/108  soft-tissue]
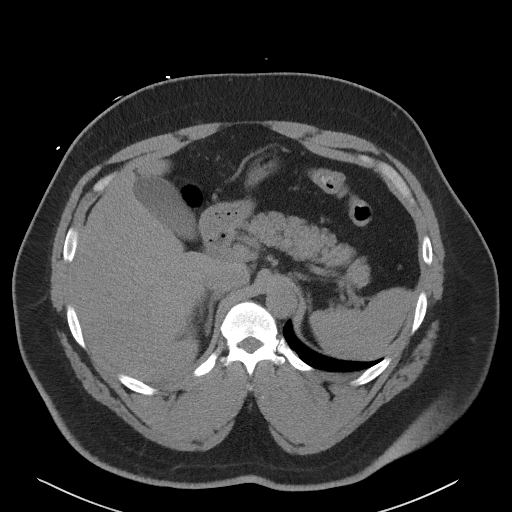
[im 84/108  soft-tissue]
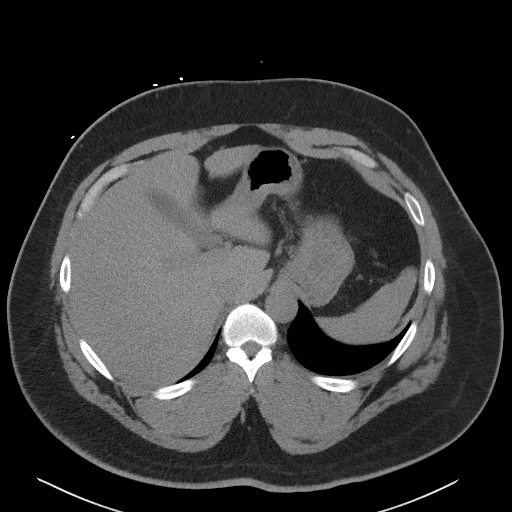
[im 94/108  soft-tissue]
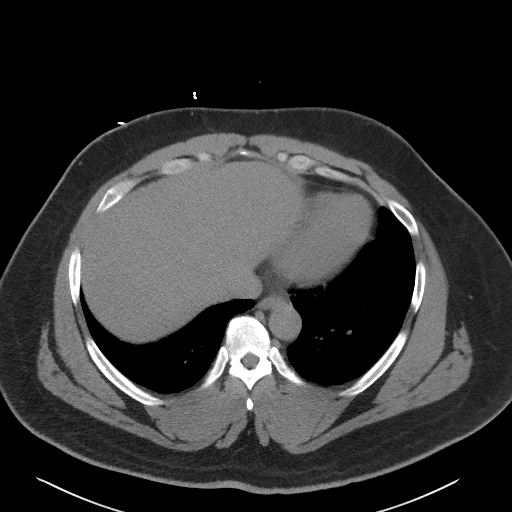
[im 103/108  soft-tissue]
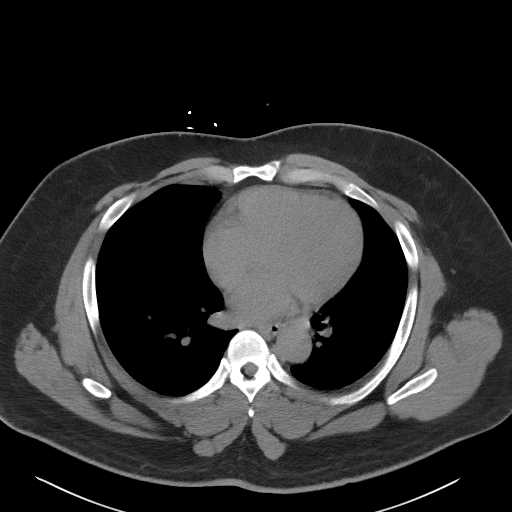

[Series 5: coronal · coronal · 0.90mm/px · 3 of 158 slices shown]
[im 53/158  soft-tissue]
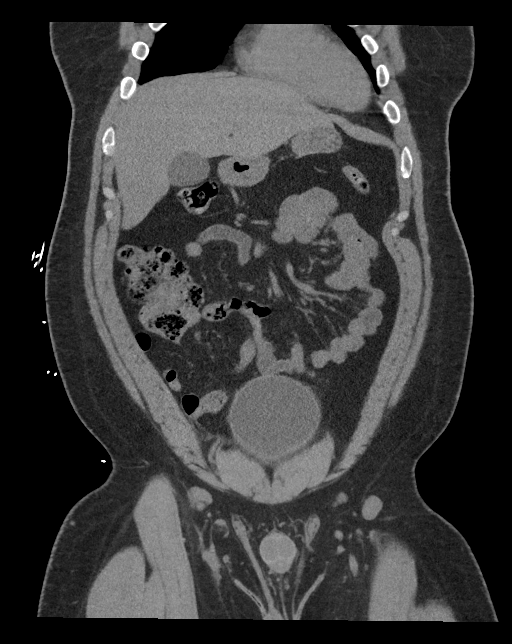
[im 70/158  soft-tissue]
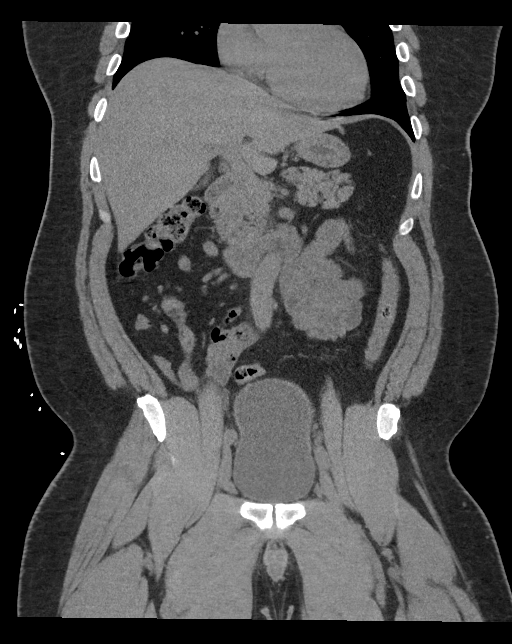
[im 88/158  soft-tissue]
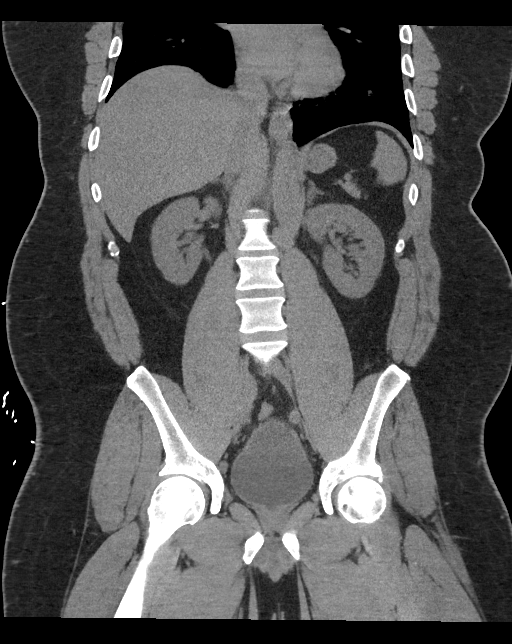

[16 of 46 positions shown; findings below may reference images not displayed]

FINDINGS: Lower chest: The lung bases are clear.

Hepatobiliary: No focal liver abnormality is seen. No gallstones,
gallbladder wall thickening, or biliary dilatation.

Pancreas: Unremarkable. No pancreatic ductal dilatation or
surrounding inflammatory changes.

Spleen: Normal in size without focal abnormality.

Adrenals/Urinary Tract: No adrenal gland nodules. Kidneys are
symmetrical in size and shape. No hydronephrosis or hydroureter. No
renal, ureteral, or bladder stones. No bladder wall thickening or
filling defect.

Stomach/Bowel: Stomach, small bowel, and colon are mostly
decompressed with scattered stool in the colon. No inflammatory
changes. The appendix is normal.

Vascular/Lymphatic: No significant vascular findings are present. No
enlarged abdominal or pelvic lymph nodes.

Reproductive: Prostate is unremarkable.

Other: No abdominal wall hernia or abnormality. No abdominopelvic
ascites.

Musculoskeletal: Mild degenerative changes in the lumbar spine with
degenerative disc disease at the lumbosacral interspace. No
destructive bone lesions.
IMPRESSION: No renal or ureteral stone or obstruction. No bladder filling
defects are identified. No acute process demonstrated in the abdomen
or pelvis.

## 2016-09-15 MED ORDER — LIDOCAINE HCL 2 % EX GEL
CUTANEOUS | Status: AC
  Filled 2016-09-15: qty 10

## 2016-09-15 MED ORDER — LIDOCAINE HCL 2 % EX GEL
1.0000 "application " | Freq: Once | CUTANEOUS | Status: AC
  Administered 2016-09-15: 1 via URETHRAL

## 2016-09-15 MED ORDER — CEPHALEXIN 500 MG PO CAPS: 500.0000 mg | ORAL_CAPSULE | Freq: Four times a day (QID) | ORAL | 0 refills | Status: AC

## 2016-09-15 MED ORDER — CEFTRIAXONE SODIUM IN DEXTROSE 20 MG/ML IV SOLN
1.0000 g | Freq: Once | INTRAVENOUS | Status: AC
  Administered 2016-09-15: 1 g via INTRAVENOUS
  Filled 2016-09-15: qty 50

## 2016-09-15 NOTE — ED Notes (Signed)
Attempted Coude 16 Fr Foley on pt but no urine return noted. Foley taken out.

## 2016-09-15 NOTE — ED Provider Notes (Signed)
[facility] Emergency Department Provider Note   ____________________________________________   First MD Initiated Contact with Patient 09/14/16 2334     (approximate)  I have reviewed the triage vital signs and the nursing notes.   HISTORY  Chief Complaint Abdominal Pain and Hematuria    HPI Alejandro Santos is a 36 y.o. male who comes into the hospital today with inability urinate. He reports that he also had a fever but he is unsure exactly how high his temperature was. He reports that since about 1:30 PM he hasn't been able to urinate.The patient reports that he's never had this happen before. He is unsure exactly what his temperature was. He has not taken any new medications and reports pain when he attempts to urinate. The patient denies any nausea or vomiting but does have some bilateral back pain. He noticed some blood in his urine and some blood clots when he did get some urine out. The patient was in some severe distress that he decided to come into the hospital today for further evaluation.   History reviewed. No pertinent past medical history.  There are no active problems to display for this patient.   Past Surgical History:  Procedure Laterality Date  . HAND SURGERY Right    right arm    Prior to Admission medications   Medication Sig Start Date End Date Taking? Authorizing Provider  cephALEXin (KEFLEX) 500 MG capsule Take 1 capsule (500 mg total) by mouth 4 (four) times daily. 09/15/16 09/25/16  Rebecka Apley, MD  HYDROcodone-acetaminophen (NORCO/VICODIN) 5-325 MG tablet Take 1-2 tablets by mouth every 6 (six) hours as needed. 05/01/15   Payton Mccallum, MD    Allergies Patient has no known allergies.  No family history on file.  Social History Social History  Substance Use Topics  . Smoking status: Never Smoker  . Smokeless tobacco: Never Used  . Alcohol use No    Review of Systems  Constitutional: No fever/chills Eyes:  No visual changes. ENT: No sore throat. Cardiovascular: Denies chest pain. Respiratory: Denies shortness of breath. Gastrointestinal:  abdominal pain nausea, no vomiting.  No diarrhea.  No constipation. Genitourinary: Hematuria and dysuria. Musculoskeletal: back pain. Skin: Negative for rash. Neurological: Negative for headaches, focal weakness or numbness.   ____________________________________________   PHYSICAL EXAM:  VITAL SIGNS: ED Triage Vitals  Enc Vitals Group     BP 09/14/16 2220 (!) 183/132     Pulse Rate 09/14/16 2220 (!) 135     Resp 09/14/16 2220 20     Temp 09/14/16 2220 97.8 F (36.6 C)     Temp src --      SpO2 09/14/16 2220 99 %     Weight 09/14/16 2221 260 lb (117.9 kg)     Height 09/14/16 2221 5\' 8"  (1.727 m)     Head Circumference --      Peak Flow --      Pain Score 09/14/16 2220 7     Pain Loc --      Pain Edu? --      Excl. in GC? --     Constitutional: Alert and oriented. Distress appearing and in Severe distress. Eyes: Conjunctivae are normal. PERRL. EOMI. Head: Atraumatic. Nose: No congestion/rhinnorhea. Mouth/Throat: Mucous membranes are moist.  Oropharynx non-erythematous. Cardiovascular: Tachycardia, regular rhythm. Grossly normal heart sounds.  Good peripheral circulation. Respiratory: Normal respiratory effort.  No retractions. Lungs CTAB. Gastrointestinal: Soft and nontender. No distention. Positive bowel sounds Musculoskeletal: No lower extremity tenderness  nor edema.   Neurologic:  Normal speech and language.  Skin:  Skin is warm, dry and intact.  Psychiatric: Mood and affect are normal.   ____________________________________________   LABS (all labs ordered are listed, but only abnormal results are displayed)  Labs Reviewed  URINALYSIS, COMPLETE (UACMP) WITH MICROSCOPIC - Abnormal; Notable for the following:       Result Value   Color, Urine AMBER (*)    APPearance CLOUDY (*)    Hgb urine dipstick LARGE (*)    Protein, ur  100 (*)    Bacteria, UA RARE (*)    All other components within normal limits  COMPREHENSIVE METABOLIC PANEL - Abnormal; Notable for the following:    Glucose, Bld 127 (*)    Creatinine, Ser 1.58 (*)    Total Protein 8.3 (*)    GFR calc non Af Amer 55 (*)    All other components within normal limits  CBC WITH DIFFERENTIAL/PLATELET - Abnormal; Notable for the following:    WBC 11.7 (*)    Neutro Abs 9.2 (*)    All other components within normal limits  URINE CULTURE  CBC WITH DIFFERENTIAL/PLATELET  CBC WITH DIFFERENTIAL/PLATELET   ____________________________________________  EKG  ED ECG REPORT I, Rebecka ApleyWebster,  Shalita Notte P, the attending physician, personally viewed and interpreted this ECG.   Date: 09/14/2016  EKG Time: 2222  Rate: 142  Rhythm: sinus tachycardia  Axis: left axis deviation  Intervals:none  ST&T Change: none  ____________________________________________  RADIOLOGY  CT renal stone study ____________________________________________   PROCEDURES  Procedure(s) performed: None  Procedures  Critical Care performed: No  ____________________________________________   INITIAL IMPRESSION / ASSESSMENT AND PLAN / ED COURSE  Pertinent labs & imaging results that were available during my care of the patient were reviewed by me and considered in my medical decision making (see chart for details).  This is a 36 year old male who comes into the hospital today with an inability to urinate. The nurse did attempt to place a Foley catheter but was unable to do so. Once they removed the catheter the patient urinated some but again could not fully empty his bladder. His urine was very dark and red. I sent the patient for a CT scan looking for possible kidney stone but was negative. I gave the patient a liter of normal saline to help clear his urine. After a few attempts they were able to pass a catheter. The patient did urinate about 150 ML's but did have 450 residual in his  bladder after urinating. We were able to get a catheter in the patient's bladder. The nurses state that the urine did clear significantly after his fluids. I will give the patient a dose of ceftriaxone and he will be discharged to home. The patient should follow-up with urology for further evaluation and to have his catheter removed.  Clinical Course as of Sep 15 344  Sun Sep 15, 2016  0259 No renal or ureteral stone or obstruction. No bladder filling defects are identified. No acute process demonstrated in the abdomen or pelvis.   CT Renal Soundra PilonStone Study [AW]    Clinical Course User Index [AW] Rebecka ApleyWebster, Elliett Guarisco P, MD     ____________________________________________   FINAL CLINICAL IMPRESSION(S) / ED DIAGNOSES  Final diagnoses:  Gross hematuria  Urinary retention  Dysuria  Cystitis      NEW MEDICATIONS STARTED DURING THIS VISIT:  New Prescriptions   CEPHALEXIN (KEFLEX) 500 MG CAPSULE    Take 1 capsule (500 mg total)  by mouth 4 (four) times daily.     Note:  This document was prepared using Dragon voice recognition software and may include unintentional dictation errors.    Rebecka Apley, MD 09/15/16 [phone]

## 2016-09-15 NOTE — ED Notes (Signed)
Pt. Able to urinate 150 ml on his own, dysuria during urination.

## 2016-09-15 NOTE — ED Notes (Signed)
Pt. Going home by self.  Pt. Given education on catheter care for leg bag and dependant bag.

## 2016-09-15 NOTE — Discharge Instructions (Signed)
Please follow up with urology to have foley removed and have further evaluation

## 2016-09-15 NOTE — ED Notes (Signed)
Bladder scan show 463 ml at this time.

## 2016-09-16 ENCOUNTER — Emergency Department
Admission: EM | Admit: 2016-09-16 | Discharge: 2016-09-16 | Disposition: A | Discharge status: Home / Self Care | Payer: 59 | Attending: Emergency Medicine | Admitting: Emergency Medicine

## 2016-09-16 ENCOUNTER — Encounter: Payer: Self-pay | Admitting: Emergency Medicine

## 2016-09-16 DIAGNOSIS — Z960 Presence of urogenital implants: Secondary | ICD-10-CM | POA: Diagnosis not present

## 2016-09-16 DIAGNOSIS — I10 Essential (primary) hypertension: Secondary | ICD-10-CM | POA: Diagnosis not present

## 2016-09-16 DIAGNOSIS — R319 Hematuria, unspecified: Secondary | ICD-10-CM | POA: Insufficient documentation

## 2016-09-16 DIAGNOSIS — R310 Gross hematuria: Secondary | ICD-10-CM

## 2016-09-16 LAB — URINE CULTURE: Culture: NO GROWTH

## 2016-09-16 MED ORDER — METOPROLOL TARTRATE 25 MG PO TABS
25.0000 mg | ORAL_TABLET | Freq: Once | ORAL | Status: AC
  Administered 2016-09-16: 25 mg via ORAL
  Filled 2016-09-16: qty 1

## 2016-09-16 MED ORDER — SODIUM CHLORIDE 0.9 % IV BOLUS (SEPSIS)
1000.0000 mL | Freq: Once | INTRAVENOUS | Status: AC
  Administered 2016-09-16: 1000 mL via INTRAVENOUS

## 2016-09-16 MED ORDER — LISINOPRIL 10 MG PO TABS
10.0000 mg | ORAL_TABLET | Freq: Once | ORAL | Status: AC
  Administered 2016-09-16: 10 mg via ORAL
  Filled 2016-09-16: qty 1

## 2016-09-16 MED ORDER — LISINOPRIL 10 MG PO TABS: 10.0000 mg | ORAL_TABLET | Freq: Every day | ORAL | 0 refills | Status: DC

## 2016-09-16 MED ORDER — IBUPROFEN 600 MG PO TABS
600.0000 mg | ORAL_TABLET | Freq: Once | ORAL | Status: AC
  Administered 2016-09-16: 600 mg via ORAL
  Filled 2016-09-16: qty 1

## 2016-09-16 MED ORDER — BUTALBITAL-APAP-CAFFEINE 50-325-40 MG PO TABS
2.0000 | ORAL_TABLET | Freq: Once | ORAL | Status: AC
  Administered 2016-09-16: 2 via ORAL
  Filled 2016-09-16: qty 2

## 2016-09-16 NOTE — ED Provider Notes (Signed)
[facility] Emergency Department Provider Note   ____________________________________________   First MD Initiated Contact with Patient 09/16/16 [phone]     (approximate)  I have reviewed the triage vital signs and the nursing notes.   HISTORY  Chief Complaint Hematuria    HPI Alejandro Santos is a 36 y.o. male who comes into the hospital today with hematuria. The patient was seen by me yesterday with urinary retention and hematuria. The patient had a Foley catheter placed and after reviewing a negative CT scan the patient was diagnosed with cystitis. The patient was sent home with a Foley in place as well as antibiotics. The patient reports that his urine has been clear all day and he had been doing well. The patient states he was drinking water and Gatorade. He went to bed and woke up to empty his bag. After emptying his back he noticed that he had blood in his urine again. The blood is not as significant as it had been yesterday but he was scared so he decided to come back into the hospital. He does still have some mild abdominal pain which she rates at a 3-4 out of 10 in intensity. The patient has been taking his antibiotics and he states that the pain has been on and off. He is here today for evaluation again.   History reviewed. No pertinent past medical history.  There are no active problems to display for this patient.   Past Surgical History:  Procedure Laterality Date  . HAND SURGERY Right    right arm    Prior to Admission medications   Medication Sig Start Date End Date Taking? Authorizing Provider  cephALEXin (KEFLEX) 500 MG capsule Take 1 capsule (500 mg total) by mouth 4 (four) times daily. 09/15/16 09/25/16 Yes Rebecka Apley, MD  lisinopril (ZESTRIL) 10 MG tablet Take 1 tablet (10 mg total) by mouth daily. 09/16/16   Rebecka Apley, MD    Allergies Patient has no known allergies.  History reviewed. No pertinent family  history.  Social History Social History  Substance Use Topics  . Smoking status: Never Smoker  . Smokeless tobacco: Never Used  . Alcohol use No    Review of Systems  Constitutional: No fever/chills Eyes: No visual changes. ENT: No sore throat. Cardiovascular: Denies chest pain. Respiratory: Denies shortness of breath. Gastrointestinal: No abdominal pain.  No nausea, no vomiting.  No diarrhea.  No constipation. Genitourinary: hematuria Musculoskeletal: Negative for back pain. Skin: Negative for rash. Neurological: Negative for headaches, focal weakness or numbness.   ____________________________________________   PHYSICAL EXAM:  VITAL SIGNS: ED Triage Vitals  Enc Vitals Group     BP 09/16/16 0324 (!) 192/139     Pulse Rate 09/16/16 0324 (!) 112     Resp 09/16/16 0324 18     Temp 09/16/16 0324 98.3 F (36.8 C)     Temp Source 09/16/16 0324 Oral     SpO2 09/16/16 0324 100 %     Weight 09/16/16 0315 260 lb (117.9 kg)     Height 09/16/16 0315 5\' 8"  (1.727 m)     Head Circumference --      Peak Flow --      Pain Score 09/16/16 0314 3     Pain Loc --      Pain Edu? --      Excl. in GC? --     Constitutional: Alert and oriented. Well appearing and in Mild distress. Eyes: Conjunctivae are normal.  PERRL. EOMI. Head: Atraumatic. Nose: No congestion/rhinnorhea. Mouth/Throat: Mucous membranes are moist.  Oropharynx non-erythematous. Cardiovascular: Normal rate, regular rhythm. Grossly normal heart sounds.  Good peripheral circulation. Respiratory: Normal respiratory effort.  No retractions. Lungs CTAB. Gastrointestinal: Soft and nontender. No distention. Positive bowel sounds Genitourinary: Hematuria Musculoskeletal: No lower extremity tenderness nor edema.   Neurologic:  Normal speech and language.  Skin:  Skin is warm, dry and intact.  Psychiatric: Mood and affect are normal.   ____________________________________________   LABS (all labs ordered are listed,  but only abnormal results are displayed)  Labs Reviewed - No data to display ____________________________________________  EKG  none ____________________________________________  RADIOLOGY  none ____________________________________________   PROCEDURES  Procedure(s) performed: None  Procedures  Critical Care performed: No  ____________________________________________   INITIAL IMPRESSION / ASSESSMENT AND PLAN / ED COURSE  Pertinent labs & imaging results that were available during my care of the patient were reviewed by me and considered in my medical decision making (see chart for details).  This is a 36 year old male who comes into the hospital today with hematuria. The patient was seen yesterday and had a Foley catheter placed. He reports that his urine has been clear until this evening. I did inform the patient that his urine is much clearer than it was yesterday and given a small amount of blood can make his urine looked red. We did perform a bladder scan and the patient only had 22 ML's of urine in his bladder. I will give the patient a liter of normal saline. His blood pressure is elevated so I will also give him a dose of metoprolol orally. The patient does appear to have some hypertension and I will discharge the patient on some medication. I will reassess the patient once he's had his fluids. He is on antibiotics and he did have a CT scan yesterday showing no evidence of kidney stones.     The patient did receive a liter of normal saline. I gave him some Lopressor orally to help his heart rate in his tachycardia. I also gave the patient a dose of lisinopril. The patient's blood pressure is significantly elevated. I will give him some Duricef for headache that he is now complaining of. He will be discharged home to follow-up with urology as well as his primary care physician. ____________________________________________   FINAL CLINICAL IMPRESSION(S) / ED  DIAGNOSES  Final diagnoses:  Gross hematuria  Hypertension, unspecified type      NEW MEDICATIONS STARTED DURING THIS VISIT:  New Prescriptions   LISINOPRIL (ZESTRIL) 10 MG TABLET    Take 1 tablet (10 mg total) by mouth daily.     Note:  This document was prepared using Dragon voice recognition software and may include unintentional dictation errors.    Rebecka ApleyWebster, Allison P, MD 09/16/16 [phone]

## 2016-09-16 NOTE — Discharge Instructions (Signed)
Again please your primary care physician to further evaluation hypertension as well as urology to remove the catheter.follow-up with

## 2016-09-16 NOTE — ED Notes (Signed)
Patient reports that tonight when he emptied his catheter bag he noticed blood tinged urine.  Patient also reports mid discomfort lower mid abdomen.  Patient non tender with palpation.  Bladder scanner showing 22 ml of urine.

## 2016-09-16 NOTE — ED Triage Notes (Signed)
Pt seen here Friday night/saturday morning and was discharged with an indwelling foley catheter; diagnosed with "gross hematuria, urinary retention, dysuria and cystitis; pt says the urine was draining clear until this am; got up to empty his bag and noticed red tinged urine in the bag; reports mild pain to low midline; does not feel like his bladder is full; taking antibiotics as prescribed

## 2016-09-16 NOTE — ED Notes (Signed)
Dr. Zenda AlpersWebster aware of pt's BP. D/w patient s/s of CVA and HTN and consequences of continued elevated BP.  Pt agreeable with plan to follow up with his PCP and urology. Pt given leg bag for home use and urinal to empty urine into at home.  Pt states headache has improved and c/o no pain.  Work note given.

## 2016-09-23 NOTE — Progress Notes (Signed)
09/24/2016 8:38 AM   Alejandro PeerMichael Santos 01/13/1981 [id number]  Referring provider: Tamera StandsLocklear, Jodella, FNP [address] [place], [place] [id number]  Chief Complaint  Patient presents with  . New Patient (Initial Visit)    ER referral Urinary Retention    HPI: Patient is a 36 -year-old African-American male who presents today as a [facility] for gross hematuria and urinary retention.  Patient presented to the emergency room on 09/14/2016 in urinary retention.  A Foley catheter was placed at that time and 450 cc of urine was returned.   His UA was positive for TNTC WBC's and TNTC RBC's.  Urine culture was negative.  A CT renal stone study performed at that time noted no renal or ureteral stone or obstruction. No bladder filling defects are identified. No acute process demonstrated in the abdomen or pelvis.  I have personally reviewed the films.  He was discharged with the Foley catheter and placed on Keflex.    He returned to the emergency room on 09/16/2016 for the complaints of blood in his drainage bag.  A bladder scan was performed in the emergency room and noted 22 cc of urine. Patient stated that he was taking his Keflex. He was reassured and instructed to keep his follow-up appointment with urology. His urine culture from that visit was negative.  He states he does not take medication on a regular basis.  He has not taken any supplements or herbal medications.  He was not taking any OTC medications.  He was not drinking alcohol.    He does not have a prior history of recurrent urinary tract infections, nephrolithiasis, trauma to the genitourinary tract, BPH or malignancies of the genitourinary tract.   He does not have a family medical history of nephrolithiasis, malignancies of the genitourinary tract or hematuria.   He is not experiencing any suprapubic pain, abdominal pain or flank pain. He/She denies any recent fevers, chills, nausea or vomiting.   He is not a smoker.    He is not exposed to secondhand smoke.  He has not worked with Personnel officerindustrial chemicals, trichloroethylene, etc.    He has HTN.    He has a high BMI.     PMH: No past medical history on file.  Surgical History: Past Surgical History:  Procedure Laterality Date  . HAND SURGERY Right    right arm  x 6    Home Medications:  Allergies as of 09/24/2016   No Known Allergies     Medication List       Accurate as of 09/24/16  8:38 AM. Always use your most recent med list.          cephALEXin 500 MG capsule Commonly known as:  KEFLEX Take 1 capsule (500 mg total) by mouth 4 (four) times daily.   lisinopril 10 MG tablet Commonly known as:  ZESTRIL Take 1 tablet (10 mg total) by mouth daily.   pregabalin 50 MG capsule Commonly known as:  LYRICA Take 50 mg by mouth 3 (three) times daily.       Allergies: No Known Allergies  Family History: Family History  Problem Relation Age of Onset  . Kidney cancer Neg Hx   . Bladder Cancer Neg Hx   . Prostate cancer Neg Hx     Social History:  reports that he has never smoked. He has never used smokeless tobacco. He reports that he does not drink alcohol or use drugs.  ROS: UROLOGY Frequent Urination?: No Hard  to postpone urination?: No Burning/pain with urination?: Yes Get up at night to urinate?: Yes Leakage of urine?: No Urine stream starts and stops?: Yes Trouble starting stream?: Yes Do you have to strain to urinate?: Yes Blood in urine?: Yes Urinary tract infection?: No Sexually transmitted disease?: No Injury to kidneys or bladder?: No Painful intercourse?: No Weak stream?: No Erection problems?: No Penile pain?: No  Gastrointestinal Nausea?: No Vomiting?: No Indigestion/heartburn?: No Diarrhea?: No Constipation?: No  Constitutional Fever: Yes Night sweats?: No Weight loss?: No Fatigue?: No  Skin Skin rash/lesions?: No Itching?: No  Eyes Blurred vision?: No Double vision?: No  Ears/Nose/Throat Sore  throat?: No Sinus problems?: No  Hematologic/Lymphatic Swollen glands?: No Easy bruising?: No  Cardiovascular Leg swelling?: No Chest pain?: No  Respiratory Cough?: No Shortness of breath?: No  Endocrine Excessive thirst?: No  Musculoskeletal Back pain?: No Joint pain?: No  Neurological Headaches?: No Dizziness?: No  Psychologic Depression?: No Anxiety?: No  Physical Exam: BP (!) 184/135   Pulse 99   Temp 98 F (36.7 C) (Oral)   Ht 5\' 8"  (1.727 m)   Wt 286 lb 9.6 oz (130 kg)   BMI 43.58 kg/m   Constitutional: Well nourished. Alert and oriented, No acute distress. HEENT: [place] AT, moist mucus membranes. Trachea midline, no masses. Cardiovascular: No clubbing, cyanosis, or edema. Respiratory: Normal respiratory effort, no increased work of breathing. GI: Abdomen is soft, non tender, non distended, no abdominal masses. Liver and spleen not palpable.  No hernias appreciated.  Stool sample for occult testing is not indicated.   GU: No CVA tenderness.  No bladder fullness or masses.  Patient with circumcised phallus.  Urethral meatus is patent.  No penile discharge. No penile lesions or rashes. Scrotum without lesions, cysts, rashes and/or edema.  Testicles are located scrotally bilaterally. No masses are appreciated in the testicles. Left and right epididymis are normal. Rectal: Patient with  normal sphincter tone. Anus and perineum without scarring or rashes. No rectal masses are appreciated. Prostate is approximately 45 grams, no nodules are appreciated. Seminal vesicles are normal. (DRE limited to patient's body habitus) Skin: No rashes, bruises or suspicious lesions. Lymph: No cervical or inguinal adenopathy. Neurologic: Grossly intact, no focal deficits, moving all 4 extremities. Psychiatric: Normal mood and affect.  Laboratory Data: Lab Results  Component Value Date   WBC 11.7 (H) 09/14/2016   HGB 15.1 09/14/2016   HCT 44.2 09/14/2016   MCV 84.1 09/14/2016    PLT 416 09/14/2016    Lab Results  Component Value Date   CREATININE 1.58 (H) 09/14/2016    Lab Results  Component Value Date   AST 32 09/14/2016   Lab Results  Component Value Date   ALT 28 09/14/2016    Pertinent Imaging: CLINICAL DATA:  Lower abdominal pain. Passing dark red blood. Urinary retention. Difficulty urinating.  EXAM: CT ABDOMEN AND PELVIS WITHOUT CONTRAST  TECHNIQUE: Multidetector CT imaging of the abdomen and pelvis was performed following the standard protocol without IV contrast.  COMPARISON:  None.  FINDINGS: Lower chest: The lung bases are clear.  Hepatobiliary: No focal liver abnormality is seen. No gallstones, gallbladder wall thickening, or biliary dilatation.  Pancreas: Unremarkable. No pancreatic ductal dilatation or surrounding inflammatory changes.  Spleen: Normal in size without focal abnormality.  Adrenals/Urinary Tract: No adrenal gland nodules. Kidneys are symmetrical in size and shape. No hydronephrosis or hydroureter. No renal, ureteral, or bladder stones. No bladder wall thickening or filling defect.  Stomach/Bowel: Stomach, small bowel, and  colon are mostly decompressed with scattered stool in the colon. No inflammatory changes. The appendix is normal.  Vascular/Lymphatic: No significant vascular findings are present. No enlarged abdominal or pelvic lymph nodes.  Reproductive: Prostate is unremarkable.  Other: No abdominal wall hernia or abnormality. No abdominopelvic ascites.  Musculoskeletal: Mild degenerative changes in the lumbar spine with degenerative disc disease at the lumbosacral interspace. No destructive bone lesions.  IMPRESSION: No renal or ureteral stone or obstruction. No bladder filling defects are identified. No acute process demonstrated in the abdomen or pelvis.   Electronically Signed   By: Burman Nieves M.D.   On: 09/15/2016 01:39  Assessment & Plan:    1. Gross  hematuria  - I explained to the patient that there are a number of causes that can be associated with blood in the urine, such as stones,  BPH, UTI's, damage to the urinary tract and/or cancer.  - may be due to infection vs Foley irritation - will continue to monitor - we recheck UA upon RTC   2. Acute urinary retention:     - foley catheter removed  -voiding trial today    -return if unable to urinate or experiencing suprapubic discomfort  -follow-up in one month for I PSS score, PVR and exam.  3. BPH with LUTS  - Continue conservative management, avoiding bladder irritants and timed voiding's  - RTC in one months for IPSS and exam   4. Uncontrolled HTN  - patient STRONGLY encouraged to follow up with PCP   Return in about 1 month (around 10/24/2016) for IPSS and PVR.  These notes generated with voice recognition software. I apologize for typographical errors.  Michiel Cowboy, PA-C  [facility] [address] [place], [place] [address] [phone]

## 2016-09-24 ENCOUNTER — Ambulatory Visit (INDEPENDENT_AMBULATORY_CARE_PROVIDER_SITE_OTHER): Payer: 59 | Admitting: Urology

## 2016-09-24 ENCOUNTER — Encounter: Payer: Self-pay | Admitting: Urology

## 2016-09-24 VITALS — BP 184/135 | HR 99 | Temp 98.0°F | Ht 68.0 in | Wt 286.6 lb

## 2016-09-24 DIAGNOSIS — N401 Enlarged prostate with lower urinary tract symptoms: Secondary | ICD-10-CM

## 2016-09-24 DIAGNOSIS — I10 Essential (primary) hypertension: Secondary | ICD-10-CM | POA: Diagnosis not present

## 2016-09-24 DIAGNOSIS — R310 Gross hematuria: Secondary | ICD-10-CM | POA: Diagnosis not present

## 2016-09-24 DIAGNOSIS — R338 Other retention of urine: Secondary | ICD-10-CM

## 2016-09-24 DIAGNOSIS — N138 Other obstructive and reflux uropathy: Secondary | ICD-10-CM

## 2016-09-24 NOTE — Progress Notes (Signed)
Catheter Removal  Patient is present today for a catheter removal.  8ml of water was drained from the balloon. A 18FR foley cath was removed from the bladder no complications were noted . Patient tolerated well.  Preformed by: Dallas Schimkeamona Williams CMA  Follow up/ Additional notes: Return to clinic by 3:00PM if can not void on own.

## 2016-10-07 ENCOUNTER — Ambulatory Visit (INDEPENDENT_AMBULATORY_CARE_PROVIDER_SITE_OTHER): Payer: 59 | Admitting: Family Medicine

## 2016-10-07 ENCOUNTER — Encounter: Payer: Self-pay | Admitting: Family Medicine

## 2016-10-07 VITALS — BP 172/120 | HR 117 | Temp 99.1°F | Wt 294.4 lb

## 2016-10-07 DIAGNOSIS — Z87448 Personal history of other diseases of urinary system: Secondary | ICD-10-CM

## 2016-10-07 DIAGNOSIS — I10 Essential (primary) hypertension: Secondary | ICD-10-CM

## 2016-10-07 MED ORDER — LISINOPRIL-HYDROCHLOROTHIAZIDE 10-12.5 MG PO TABS: 1.0000 | ORAL_TABLET | Freq: Every day | ORAL | 3 refills | Status: DC

## 2016-10-07 MED ORDER — METOPROLOL SUCCINATE ER 50 MG PO TB24: 50.0000 mg | ORAL_TABLET | Freq: Every day | ORAL | 0 refills | Status: DC

## 2016-10-07 NOTE — Progress Notes (Signed)
Patient: Alejandro Santos Male    DOB: [birth year]   36 y.o.   MRN: [id number] Visit Date: 10/07/2016   Today's Provider: Dortha Kern, PA   Chief Complaint  Patient presents with  . Hypertension   Subjective:    Hypertension  This is a new problem. The problem is unchanged (BP this morning was 170's/100's). The problem is uncontrolled. Associated symptoms include headaches and sweats. There are no associated agents to hypertension. Risk factors for coronary artery disease include male gender, obesity, family history and stress. Treatments tried: Lisinopril 10 mg prescribed on 09/16/2016 by ER MD. There are no compliance problems.    Patient Active Problem List   Diagnosis Date Noted  . Essential hypertension 10/07/2016   Past Surgical History:  Procedure Laterality Date  . HAND SURGERY Right    right arm  x 6   Family History  Problem Relation Age of Onset  . Kidney cancer Neg Hx   . Bladder Cancer Neg Hx   . Prostate cancer Neg Hx    No Known Allergies    Previous Medications   LISINOPRIL (ZESTRIL) 10 MG TABLET    Take 1 tablet (10 mg total) by mouth daily.   PREGABALIN (LYRICA) 50 MG CAPSULE    Take 50 mg by mouth 3 (three) times daily.    Review of Systems  Constitutional: Negative.        Sweats in relation to headaches and elevated BP  Respiratory: Negative.   Cardiovascular: Negative.   Neurological: Positive for headaches.    Social History  Substance Use Topics  . Smoking status: Never Smoker  . Smokeless tobacco: Never Used  . Alcohol use No   Objective:   BP (!) 172/120 (BP Location: Right Arm, Patient Position: Sitting, Cuff Size: Large)   Pulse (!) 117   Temp 99.1 F (37.3 C) (Oral)   Wt 294 lb 6.4 oz (133.5 kg)   SpO2 99%   BMI 44.76 kg/m   Physical Exam  Constitutional: He is oriented to person, place, and time. He appears well-developed and well-nourished. No distress.  HENT:  Head: Normocephalic and atraumatic.  Right Ear: Hearing  normal.  Left Ear: Hearing normal.  Nose: Nose normal.  Eyes: Conjunctivae and lids are normal. Right eye exhibits no discharge. Left eye exhibits no discharge. No scleral icterus.  Cardiovascular: Regular rhythm.   Tachycardia.  Pulmonary/Chest: Effort normal. No respiratory distress.  Abdominal: Soft. Bowel sounds are normal.  Obesity.  Musculoskeletal: Normal range of motion. He exhibits no edema.  Neurological: He is alert and oriented to person, place, and time.  Skin: Skin is intact. No lesion and no rash noted.  Psychiatric: He has a normal mood and affect. His speech is normal and behavior is normal. Thought content normal.  Stress at work - performance/production related.      Assessment & Plan:     1. Essential hypertension Found extreme hypertension when diagnosed with UTI causing hematuria and urinary retention on 09-14-16. No chest pains, palpitations, dyspnea or edema. Has been taking Lisinopril 10 mg qd for the past 3 weeks but no improvement in pressure yet. EKG in the ER showed tachycardia (HR 142) with pain of urinary retention. No further abdominal or bladder discomfort. Will add HCTZ and Metoprolol. Recheck CBC and CMP. Schedule follow up of BP in 3 weeks. - CBC with Differential/Platelet - Comprehensive metabolic panel - metoprolol succinate (TOPROL-XL) 50 MG 24 hr tablet; Take 1 tablet (50 mg total) by mouth daily.  Take with or immediately following a meal.  Dispense: 30 tablet; Refill: 0 - lisinopril-hydrochlorothiazide (PRINZIDE,ZESTORETIC) 10-12.5 MG tablet; Take 1 tablet by mouth daily.  Dispense: 90 tablet; Refill: 3  2. Hx of hematuria Went to the ER on 09-14-16 and 09-16-16 for gross hematuria with urinary retention. No kidney stones found. Treated for UTI and no discomfort today. No further hematuria and has appointment to recheck with urologist on 10-24-16.

## 2016-10-09 LAB — COMPREHENSIVE METABOLIC PANEL
A/G RATIO: 1.6 (ref 1.2–2.2)
ALBUMIN: 4.7 g/dL (ref 3.5–5.5)
ALK PHOS: 65 IU/L (ref 39–117)
ALT: 37 IU/L (ref 0–44)
AST: 23 IU/L (ref 0–40)
BILIRUBIN TOTAL: 0.3 mg/dL (ref 0.0–1.2)
BUN / CREAT RATIO: 11 (ref 9–20)
BUN: 17 mg/dL (ref 6–20)
CHLORIDE: 102 mmol/L (ref 96–106)
CO2: 24 mmol/L (ref 20–29)
Calcium: 9.7 mg/dL (ref 8.7–10.2)
Creatinine, Ser: 1.53 mg/dL — ABNORMAL HIGH (ref 0.76–1.27)
GFR calc non Af Amer: 58 mL/min/{1.73_m2} — ABNORMAL LOW (ref >59)
GFR, EST AFRICAN AMERICAN: 67 mL/min/{1.73_m2} (ref >59)
GLOBULIN, TOTAL: 3 g/dL (ref 1.5–4.5)
Glucose: 100 mg/dL — ABNORMAL HIGH (ref 65–99)
Potassium: 4.1 mmol/L (ref 3.5–5.2)
SODIUM: 144 mmol/L (ref 134–144)
TOTAL PROTEIN: 7.7 g/dL (ref 6.0–8.5)

## 2016-10-09 LAB — CBC WITH DIFFERENTIAL/PLATELET
BASOS ABS: 0 10*3/uL (ref 0.0–0.2)
Basos: 0 %
EOS (ABSOLUTE): 0.2 10*3/uL (ref 0.0–0.4)
Eos: 2 %
Hematocrit: 44.8 % (ref 37.5–51.0)
Hemoglobin: 14.7 g/dL (ref 13.0–17.7)
Immature Grans (Abs): 0 10*3/uL (ref 0.0–0.1)
Immature Granulocytes: 0 %
LYMPHS ABS: 4.3 10*3/uL — AB (ref 0.7–3.1)
Lymphs: 46 %
MCH: 28.4 pg (ref 26.6–33.0)
MCHC: 32.8 g/dL (ref 31.5–35.7)
MCV: 87 fL (ref 79–97)
MONOCYTES: 8 %
Monocytes Absolute: 0.7 10*3/uL (ref 0.1–0.9)
NEUTROS ABS: 4.1 10*3/uL (ref 1.4–7.0)
Neutrophils: 44 %
Platelets: 446 10*3/uL — ABNORMAL HIGH (ref 150–379)
RBC: 5.18 x10E6/uL (ref 4.14–5.80)
RDW: 15.1 % (ref 12.3–15.4)
WBC: 9.4 10*3/uL (ref 3.4–10.8)

## 2016-10-10 ENCOUNTER — Telehealth: Payer: Self-pay

## 2016-10-10 NOTE — Telephone Encounter (Signed)
Patient advised. [facility] on [facility]/2016 and they will be able to see lab report in chart.

## 2016-10-10 NOTE — Telephone Encounter (Signed)
-----   Message from Tamsen Roersennis E Chrismon, [place] sent at 10/10/2016  8:24 AM EDT ----- No sign of anemia or infection on CBC. Kidney function test shows normal blood sugar but still has some kidney strain (elevation of Creatinine still present). Be sure urologist sees this report (patient may have a copy to go with him to the appointment on 10-24-16). Recheck BP as planned.

## 2016-10-23 NOTE — Progress Notes (Signed)
10/24/2016 11:15 AM   Alejandro Santos 07/29/1980 [id number]  Referring provider: Tamera Stands, FNP [address] [place], [place] [address]  Chief Complaint  Patient presents with  . Hematuria    32month    HPI: Patient is a 36 -year-old African-American male who presents today for a one month follow up for gross hematuria and urinary retention.  Background history Patient presented to the emergency room on 09/14/2016 in urinary retention.  A Foley catheter was placed at that time and 450 cc of urine was returned.   His UA was positive for TNTC WBC's and TNTC RBC's.  Urine culture was negative.  A CT renal stone study performed at that time noted no renal or ureteral stone or obstruction. No bladder filling defects are identified. No acute process demonstrated in the abdomen or pelvis.  He was discharged with the Foley catheter and placed on Keflex.  He returned to the emergency room on 09/16/2016 for the complaints of blood in his drainage bag.  A bladder scan was performed in the emergency room and noted 22 cc of urine. Patient stated that he was taking his Keflex. He was reassured and instructed to keep his follow-up appointment with urology. His urine culture from that visit was negative.  He states he does not take medication on a regular basis.  He has not taken any supplements or herbal medications.  He was not taking any OTC medications.  He was not drinking alcohol.   He does not have a prior history of recurrent urinary tract infections, nephrolithiasis, trauma to the genitourinary tract, BPH or malignancies of the genitourinary tract.   He does not have a family medical history of nephrolithiasis, malignancies of the genitourinary tract or hematuria. He is not experiencing any suprapubic pain, abdominal pain or flank pain. He/She denies any recent fevers, chills, nausea or vomiting.  He is not a smoker.   He is not exposed to secondhand smoke.  He has not worked with Careers information officer, trichloroethylene, etc.    He has HTN.    He has a high BMI.    BPH WITH LUTS  (prostate and/or bladder) His IPSS score today is 2, which is mild lower urinary tract symptomatology.  He is mostly satisfied with his quality life due to his urinary symptoms.  His PVR is 45 mL.      His major complaint today nocturia x 1.  He has had these symptoms for several months.   He denies any dysuria, hematuria or suprapubic pain.   He currently taking tamsulosin 0.4 mg daily.    His has had an episode of urinary retention.       He also denies any recent fevers, chills, nausea or vomiting.  He does not have a family history of PCa.      IPSS    Row Name 10/24/16 1000         International Prostate Symptom Score   How often have you had the sensation of not emptying your bladder? Less than 1 in 5     How often have you had to urinate less than every two hours? Not at All     How often have you found you stopped and started again several times when you urinated? Not at All     How often have you found it difficult to postpone urination? Not at All     How often have you had a weak urinary stream? Not at All  How often have you had to strain to start urination? Not at All     How many times did you typically get up at night to urinate? 1 Time     Total IPSS Score 2       Quality of Life due to urinary symptoms   If you were to spend the rest of your life with your urinary condition just the way it is now how would you feel about that? Mostly Satisfied        Score:  1-7 Mild 8-19 Moderate 20-35 Severe   His UA today was negative for hematuria.      He states that since the catheter had been and he has not noted any further spontaneous erections.  He also states that his libido has decreased.   PMH: No past medical history on file.  Surgical History: Past Surgical History:  Procedure Laterality Date  . HAND SURGERY Right    right arm  x 6    Home Medications:    Allergies as of 10/24/2016   No Known Allergies     Medication List       Accurate as of 10/24/16 11:15 AM. Always use your most recent med list.          lisinopril-hydrochlorothiazide 10-12.5 MG tablet Commonly known as:  PRINZIDE,ZESTORETIC Take 1 tablet by mouth daily.   metoprolol succinate 50 MG 24 hr tablet Commonly known as:  TOPROL-XL Take 1 tablet (50 mg total) by mouth daily. Take with or immediately following a meal.   pregabalin 50 MG capsule Commonly known as:  LYRICA Take 50 mg by mouth 3 (three) times daily.       Allergies: No Known Allergies  Family History: Family History  Problem Relation Age of Onset  . Kidney cancer Neg Hx   . Bladder Cancer Neg Hx   . Prostate cancer Neg Hx     Social History:  reports that he has never smoked. He has never used smokeless tobacco. He reports that he does not drink alcohol or use drugs.  ROS: UROLOGY Frequent Urination?: No Hard to postpone urination?: No Burning/pain with urination?: No Get up at night to urinate?: No Leakage of urine?: No Urine stream starts and stops?: No Trouble starting stream?: No Do you have to strain to urinate?: No Blood in urine?: No Urinary tract infection?: No Sexually transmitted disease?: No Injury to kidneys or bladder?: No Painful intercourse?: No Weak stream?: No Erection problems?: No Penile pain?: No  Gastrointestinal Nausea?: No Vomiting?: No Indigestion/heartburn?: No Diarrhea?: No Constipation?: No  Constitutional Fever: No Night sweats?: No Weight loss?: No Fatigue?: No  Skin Skin rash/lesions?: No Itching?: No  Eyes Blurred vision?: No Double vision?: No  Ears/Nose/Throat Sore throat?: No Sinus problems?: No  Hematologic/Lymphatic Swollen glands?: No Easy bruising?: No  Cardiovascular Leg swelling?: No Chest pain?: No  Respiratory Cough?: No Shortness of breath?: No  Endocrine Excessive thirst?: No  Musculoskeletal Back  pain?: No Joint pain?: No  Neurological Headaches?: No Dizziness?: No  Psychologic Depression?: No Anxiety?: No  Physical Exam: BP (!) 149/107   Pulse 85   Ht 5\' 8"  (1.727 m)   Wt 291 lb (132 kg)   BMI 44.25 kg/m   Constitutional: Well nourished. Alert and oriented, No acute distress. HEENT: [place] AT, moist mucus membranes. Trachea midline, no masses. Cardiovascular: No clubbing, cyanosis, or edema. Respiratory: Normal respiratory effort, no increased work of breathing. Skin: No rashes, bruises or suspicious lesions. Lymph:  No cervical or inguinal adenopathy. Neurologic: Grossly intact, no focal deficits, moving all 4 extremities. Psychiatric: Normal mood and affect.  Laboratory Data: Lab Results  Component Value Date   WBC 9.4 10/08/2016   HGB 14.7 10/08/2016   HCT 44.8 10/08/2016   MCV 87 10/08/2016   PLT 446 (H) 10/08/2016    Lab Results  Component Value Date   CREATININE 1.53 (H) 10/08/2016    Lab Results  Component Value Date   AST 23 10/08/2016   Lab Results  Component Value Date   ALT 37 10/08/2016   Urinalysis Unremarkable.  See EPIC.    I have reviewed the labs  Pertinent Imaging: CLINICAL DATA:  Lower abdominal pain. Passing dark red blood. Urinary retention. Difficulty urinating.  EXAM: CT ABDOMEN AND PELVIS WITHOUT CONTRAST  TECHNIQUE: Multidetector CT imaging of the abdomen and pelvis was performed following the standard protocol without IV contrast.  COMPARISON:  None.  FINDINGS: Lower chest: The lung bases are clear.  Hepatobiliary: No focal liver abnormality is seen. No gallstones, gallbladder wall thickening, or biliary dilatation.  Pancreas: Unremarkable. No pancreatic ductal dilatation or surrounding inflammatory changes.  Spleen: Normal in size without focal abnormality.  Adrenals/Urinary Tract: No adrenal gland nodules. Kidneys are symmetrical in size and shape. No hydronephrosis or hydroureter. No renal,  ureteral, or bladder stones. No bladder wall thickening or filling defect.  Stomach/Bowel: Stomach, small bowel, and colon are mostly decompressed with scattered stool in the colon. No inflammatory changes. The appendix is normal.  Vascular/Lymphatic: No significant vascular findings are present. No enlarged abdominal or pelvic lymph nodes.  Reproductive: Prostate is unremarkable.  Other: No abdominal wall hernia or abnormality. No abdominopelvic ascites.  Musculoskeletal: Mild degenerative changes in the lumbar spine with degenerative disc disease at the lumbosacral interspace. No destructive bone lesions.  IMPRESSION: No renal or ureteral stone or obstruction. No bladder filling defects are identified. No acute process demonstrated in the abdomen or pelvis.   Electronically Signed   By: Burman NievesWilliam  Stevens M.D.   On: 09/15/2016 01:39  Assessment & Plan:    1. Gross hematuria  - UA today was unremarkable  2. Acute urinary retention  - PVR 45 mL  3. Elevated serum creatinine   - recheck creatinine at [facility] today  4. BPH with LUTS  - Continue conservative management, avoiding bladder irritants and timed voiding's  - check PSA today  - RTC pending results  5. Uncontrolled HTN  - followed by PCP  6. Low libido  - check testosterone level today  - RTC pending results  7. ED  - Explained to the patient that his erectile dysfunction may be the result of his elevated blood pressure  - Encouraged him to continue to work with Aldine Contesennis Chrisman in an effort to get under better control  - Given a trial of Viagra 100 mg, advised to take it 2 hours prior to intercourse on an empty stomach, Take the medication two hours prior to intercourse on an empty stomach.  He is warned not to take the Viagra with medications that contain nitrates.  I also advised him of the side effects, such as: headache, flushing, dyspepsia, abnormal vision, nasal congestion, back pain, myalgia,  nausea, dizziness, and rash.   Return for pending blood work.  These notes generated with voice recognition software. I apologize for typographical errors.  Michiel CowboySHANNON Shamieka Gullo, PA-C  [facility] [address], [address] [place], [place] [address] [phone]

## 2016-10-24 ENCOUNTER — Encounter: Payer: Self-pay | Admitting: Family Medicine

## 2016-10-24 ENCOUNTER — Ambulatory Visit (INDEPENDENT_AMBULATORY_CARE_PROVIDER_SITE_OTHER): Payer: 59 | Admitting: Urology

## 2016-10-24 ENCOUNTER — Encounter: Payer: Self-pay | Admitting: Urology

## 2016-10-24 ENCOUNTER — Ambulatory Visit (INDEPENDENT_AMBULATORY_CARE_PROVIDER_SITE_OTHER): Payer: 59 | Admitting: Family Medicine

## 2016-10-24 VITALS — BP 149/107 | HR 85 | Ht 68.0 in | Wt 291.0 lb

## 2016-10-24 VITALS — BP 154/116 | HR 91 | Temp 98.2°F | Wt 291.6 lb

## 2016-10-24 DIAGNOSIS — R51 Headache: Secondary | ICD-10-CM | POA: Diagnosis not present

## 2016-10-24 DIAGNOSIS — N401 Enlarged prostate with lower urinary tract symptoms: Secondary | ICD-10-CM | POA: Diagnosis not present

## 2016-10-24 DIAGNOSIS — R7989 Other specified abnormal findings of blood chemistry: Secondary | ICD-10-CM

## 2016-10-24 DIAGNOSIS — R6882 Decreased libido: Secondary | ICD-10-CM

## 2016-10-24 DIAGNOSIS — I10 Essential (primary) hypertension: Secondary | ICD-10-CM

## 2016-10-24 DIAGNOSIS — R519 Headache, unspecified: Secondary | ICD-10-CM

## 2016-10-24 DIAGNOSIS — N138 Other obstructive and reflux uropathy: Secondary | ICD-10-CM

## 2016-10-24 DIAGNOSIS — N529 Male erectile dysfunction, unspecified: Secondary | ICD-10-CM | POA: Diagnosis not present

## 2016-10-24 DIAGNOSIS — R338 Other retention of urine: Secondary | ICD-10-CM | POA: Diagnosis not present

## 2016-10-24 DIAGNOSIS — G8929 Other chronic pain: Secondary | ICD-10-CM

## 2016-10-24 DIAGNOSIS — R310 Gross hematuria: Secondary | ICD-10-CM

## 2016-10-24 LAB — URINALYSIS, COMPLETE
BILIRUBIN UA: NEGATIVE
GLUCOSE, UA: NEGATIVE
KETONES UA: NEGATIVE
Leukocytes, UA: NEGATIVE
NITRITE UA: NEGATIVE
PROTEIN UA: NEGATIVE
Specific Gravity, UA: 1.025 (ref 1.005–1.030)
UUROB: 0.2 mg/dL (ref 0.2–1.0)
pH, UA: 5 (ref 5.0–7.5)

## 2016-10-24 LAB — MICROSCOPIC EXAMINATION: Bacteria, UA: NONE SEEN

## 2016-10-24 LAB — BLADDER SCAN AMB NON-IMAGING

## 2016-10-24 NOTE — Progress Notes (Addendum)
Patient: Alejandro Santos Male    DOB: October 21, 1980   35 y.o.   MRN: [id number] Visit Date: 10/24/2016  Today's Provider: Dortha Kern, PA   Chief Complaint  Patient presents with  . Hypertension  . Follow-up   Subjective:    HPI  Hypertension, follow-up:  BP Readings from Last 3 Encounters:  10/24/16 (!) 154/116  10/07/16 (!) 172/120  09/24/16 (!) 184/135    He was last seen for hypertension 3 weeks ago.  BP at that visit was 172/120. Management changes since that visit include stopped Lisinopril 10 mg. Started Lisinopril-HCTZ 10-12.5 mg and Metoprolol 50 mg. He reports good compliance with treatment. He is not having side effects.  He is exercising. He is adherent to low salt diet.   Outside blood pressures are being checked 3-4 times daily. He is experiencing none.  Patient denies chest pain, chest pressure/discomfort, irregular heart beat and palpitations.   Cardiovascular risk factors include hypertension, male gender and obesity (BMI >= 30 kg/m2).  Use of agents associated with hypertension: none.     Weight trend: stable Wt Readings from Last 3 Encounters:  10/24/16 291 lb 9.6 oz (132.3 kg)  10/07/16 294 lb 6.4 oz (133.5 kg)  09/24/16 286 lb 9.6 oz (130 kg)    Current diet: no pork and no sodas   ------------------------------------------------------------------------ Patient Active Problem List   Diagnosis Date Noted  . Essential hypertension 10/07/2016   Past Surgical History:  Procedure Laterality Date  . HAND SURGERY Right    right arm  x 6   Family History  Problem Relation Age of Onset  . Kidney cancer Neg Hx   . Bladder Cancer Neg Hx   . Prostate cancer Neg Hx    No Known Allergies   Previous Medications   LISINOPRIL-HYDROCHLOROTHIAZIDE (PRINZIDE,ZESTORETIC) 10-12.5 MG TABLET    Take 1 tablet by mouth daily.   METOPROLOL SUCCINATE (TOPROL-XL) 50 MG 24 HR TABLET    Take 1 tablet (50 mg total) by mouth daily. Take with or immediately  following a meal.   PREGABALIN (LYRICA) 50 MG CAPSULE    Take 50 mg by mouth 3 (three) times daily.    Review of Systems  Constitutional: Negative.   HENT: Negative.   Respiratory: Negative.   Cardiovascular: Negative.   Neurological: Positive for headaches.    Social History  Substance Use Topics  . Smoking status: Never Smoker  . Smokeless tobacco: Never Used  . Alcohol use No   Objective:   BP (!) 154/116 (BP Location: Right Arm, Patient Position: Sitting, Cuff Size: Large)   Pulse 91   Temp 98.2 F (36.8 C) (Oral)   Wt 291 lb 9.6 oz (132.3 kg)   SpO2 98%   BMI 44.34 kg/m   Physical Exam  Constitutional: He is oriented to person, place, and time. He appears well-developed and well-nourished. No distress.  HENT:  Head: Normocephalic and atraumatic.  Right Ear: Hearing and external ear normal.  Left Ear: Hearing and external ear normal.  Nose: Nose normal.  Mouth/Throat: Oropharynx is clear and moist.  Eyes: Conjunctivae, EOM and lids are normal. Right eye exhibits no discharge. Left eye exhibits no discharge. No scleral icterus.  Neck: Neck supple. No thyromegaly present.  Cardiovascular: Normal rate and regular rhythm.   Pulmonary/Chest: Effort normal and breath sounds normal. No respiratory distress.  Abdominal: Bowel sounds are normal.  Musculoskeletal: Normal range of motion.  Multiple scars right forearm and hand from past crush trauma requiring multiple screws  and plates to repair. 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 Loss of thenar muscle and grip in the right hand..  Neurological: He is alert and oriented to person, place, and time.  Skin: Skin is intact. No lesion and no rash noted.  Psychiatric: He has a normal mood and affect. His speech is normal and behavior is normal. Thought content normal.   Depression screen PHQ 2/9 10/24/2016  Decreased Interest 0  Down, Depressed, Hopeless 0  PHQ - 2 Score 0  Altered sleeping 2  Tired, decreased energy 2    Change in appetite 0  Feeling bad or failure about yourself  0  Trouble concentrating 0  Moving slowly or fidgety/restless 0  Suicidal thoughts 0  PHQ-9 Score 4  Difficult doing work/chores Not difficult at all    Assessment & Plan:     1. Uncontrolled hypertension Tolerating Lisinopril/HCTZ 10/12.5 mg qd with Metoprolol Succinate 50 mg qd. Slightly better BP. Recommend increase Lisinopril/HCTZ to 2 tablets daily and continue same dose of Metoprolol. Recheck renal function to see that creatinine better. Will follow up with urologist today. Schedule BP follow up in 3 weeks. - Renal Function Panel  2. Chronic nonintractable headache, unspecified headache type States he has been having bitemporal headaches each morning every other day since January 2018. 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 Occasionally headaches are intense enough to cause some nausea. No light or sound sensitivity. Tylenol-PM and sleep will stop the headaches. Questionable yellow spots in vision prior to headaches occasionally. May be mixed vascular and tension headache. May use Tylenol-PM with Aleve BID and will get CT scan. Intensity of headache and frequency has been better since starting the Metoprolol and having the urinary catheter removed. Describes headache as a sharp intense pain. Recheck pending report. - CT Head Wo Contrast

## 2016-10-24 NOTE — Patient Instructions (Signed)
Migraine Headache A migraine headache is an intense, throbbing pain on one side or both sides of the head. Migraines may also cause other symptoms, such as nausea, vomiting, and sensitivity to light and noise. What are the causes? Doing or taking certain things may also trigger migraines, such as:  Alcohol.  Smoking.  Medicines, such as: ? Medicine used to treat chest pain (nitroglycerine). ? Birth control pills. ? Estrogen pills. ? Certain blood pressure medicines.  Aged cheeses, chocolate, or caffeine.  Foods or drinks that contain nitrates, glutamate, aspartame, or tyramine.  Physical activity.  Other things that may trigger a migraine include:  Menstruation.  Pregnancy.  Hunger.  Stress, lack of sleep, too much sleep, or fatigue.  Weather changes.  What increases the risk? The following factors may make you more likely to experience migraine headaches:  Age. Risk increases with age.  Family history of migraine headaches.  Being Caucasian.  Depression and anxiety.  Obesity.  Being a woman.  Having a hole in the heart (patent foramen ovale) or other heart problems.  What are the signs or symptoms? The main symptom of this condition is pulsating or throbbing pain. Pain may:  Happen in any area of the head, such as on one side or both sides.  Interfere with daily activities.  Get worse with physical activity.  Get worse with exposure to bright lights or loud noises.  Other symptoms may include:  Nausea.  Vomiting.  Dizziness.  General sensitivity to bright lights, loud noises, or smells.  Before you get a migraine, you may get warning signs that a migraine is developing (aura). An aura may include:  Seeing flashing lights or having blind spots.  Seeing bright spots, halos, or zigzag lines.  Having tunnel vision or blurred vision.  Having numbness or a tingling feeling.  Having trouble talking.  Having muscle weakness.  How is this  diagnosed? A migraine headache can be diagnosed based on:  Your symptoms.  A physical exam.  Tests, such as CT scan or MRI of the head. These imaging tests can help rule out other causes of headaches.  Taking fluid from the spine (lumbar puncture) and analyzing it (cerebrospinal fluid analysis, or CSF analysis).  How is this treated? A migraine headache is usually treated with medicines that:  Relieve pain.  Relieve nausea.  Prevent migraines from coming back.  Treatment may also include:  Acupuncture.  Lifestyle changes like avoiding foods that trigger migraines.  Follow these instructions at home: Medicines  Take over-the-counter and prescription medicines only as told by your health care provider.  Do not drive or use heavy machinery while taking prescription pain medicine.  To prevent or treat constipation while you are taking prescription pain medicine, your health care provider may recommend that you: ? Drink enough fluid to keep your urine clear or pale yellow. ? Take over-the-counter or prescription medicines. ? Eat foods that are high in fiber, such as fresh fruits and vegetables, whole grains, and beans. ? Limit foods that are high in fat and processed sugars, such as fried and sweet foods. Lifestyle  Avoid alcohol use.  Do not use any products that contain nicotine or tobacco, such as cigarettes and e-cigarettes. If you need help quitting, ask your health care provider.  Get at least 8 hours of sleep every night.  Limit your stress. General instructions   Keep a journal to find out what may trigger your migraine headaches. For example, write down: ? What you eat and   drink. ? How much sleep you get. ? Any change to your diet or medicines.  If you have a migraine: ? Avoid things that make your symptoms worse, such as bright lights. ? It may help to lie down in a dark, quiet room. ? Do not drive or use heavy machinery. ? Ask your health care provider  what activities are safe for you while you are experiencing symptoms.  Keep all follow-up visits as told by your health care provider. This is important. Contact a health care provider if:  You develop symptoms that are different or more severe than your usual migraine symptoms. Get help right away if:  Your migraine becomes severe.  You have a fever.  You have a stiff neck.  You have vision loss.  Your muscles feel weak or like you cannot control them.  You start to lose your balance often.  You develop trouble walking.  You faint. This information is not intended to replace advice given to you by your health care provider. Make sure you discuss any questions you have with your health care provider. Document Released: 04/08/2005 Document Revised: 10/27/2015 Document Reviewed: 09/25/2015 Elsevier Interactive Patient Education  2017 [employer] Inc[employer]   

## 2016-10-25 ENCOUNTER — Telehealth: Payer: Self-pay

## 2016-10-25 DIAGNOSIS — E291 Testicular hypofunction: Secondary | ICD-10-CM

## 2016-10-25 LAB — RENAL FUNCTION PANEL
Albumin: 4.4 g/dL (ref 3.5–5.5)
BUN / CREAT RATIO: 11 (ref 9–20)
BUN: 16 mg/dL (ref 6–20)
CALCIUM: 9.4 mg/dL (ref 8.7–10.2)
CO2: 21 mmol/L (ref 20–29)
Chloride: 100 mmol/L (ref 96–106)
Creatinine, Ser: 1.4 mg/dL — ABNORMAL HIGH (ref 0.76–1.27)
GFR calc Af Amer: 75 mL/min/{1.73_m2} (ref >59)
GFR, EST NON AFRICAN AMERICAN: 65 mL/min/{1.73_m2} (ref >59)
GLUCOSE: 99 mg/dL (ref 65–99)
POTASSIUM: 4.2 mmol/L (ref 3.5–5.2)
Phosphorus: 3.6 mg/dL (ref 2.5–4.5)
SODIUM: 140 mmol/L (ref 134–144)

## 2016-10-25 LAB — PSA: PROSTATE SPECIFIC AG, SERUM: 1.3 ng/mL (ref 0.0–4.0)

## 2016-10-25 LAB — TESTOSTERONE: TESTOSTERONE: 190 ng/dL — AB (ref 264–916)

## 2016-10-25 NOTE — Telephone Encounter (Signed)
-----   Message from Harle BattiestShannon A McGowan, PA-C sent at 10/25/2016  8:07 AM EDT ----- Please let Mr. Alejandro Santos know her PSA is normal.  His testosterone is low.  We will need to repeat the testosterone level before 9 AM.

## 2016-10-25 NOTE — Telephone Encounter (Signed)
Patient was calling back-aa

## 2016-10-25 NOTE — Telephone Encounter (Signed)
-----   Message from Tamsen Roersennis E Chrismon, [place] sent at 10/25/2016  7:34 AM EDT ----- Creatinine continues to improve. Proceed with BP meds and recheck as planned.

## 2016-10-25 NOTE — Telephone Encounter (Signed)
Patient advised.

## 2016-10-25 NOTE — Telephone Encounter (Signed)
Patient notified and schedule for lab, order placed

## 2016-11-01 ENCOUNTER — Other Ambulatory Visit: Payer: 59

## 2016-11-01 ENCOUNTER — Ambulatory Visit
Admission: RE | Admit: 2016-11-01 | Discharge: 2016-11-01 | Disposition: A | Discharge status: Home / Self Care | Payer: 59 | Source: Ambulatory Visit | Attending: Family Medicine | Admitting: Family Medicine

## 2016-11-01 ENCOUNTER — Telehealth: Payer: Self-pay

## 2016-11-01 DIAGNOSIS — I10 Essential (primary) hypertension: Secondary | ICD-10-CM | POA: Insufficient documentation

## 2016-11-01 DIAGNOSIS — R51 Headache: Secondary | ICD-10-CM | POA: Diagnosis present

## 2016-11-01 DIAGNOSIS — E291 Testicular hypofunction: Secondary | ICD-10-CM

## 2016-11-01 IMAGING — CT CT HEAD W/O CM
3 series · 15 of 47 positions shown, 18 images · non-contrast
Comparison: None.

CLINICAL DATA: Headaches, dizziness.

EXAM:
CT HEAD WITHOUT CONTRAST
TECHNIQUE: Contiguous axial images were obtained from the base of the skull
through the vertex without intravenous contrast.

[Series 2: head wo · axial · 0.45mm/px · z∈[+956,+1081]mm · 9 of 30 slices shown, 12 images]
[im 3/30  brain]
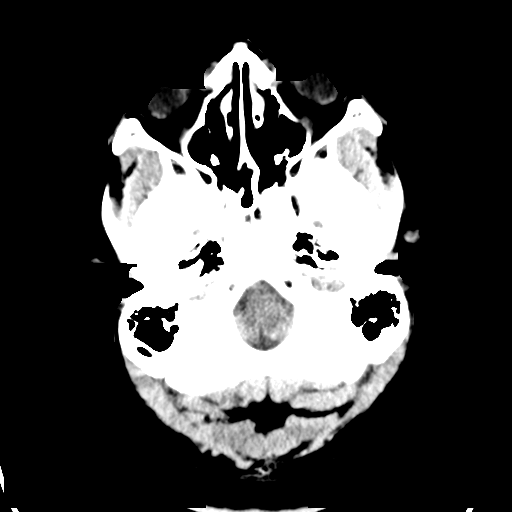
[im 3/30  bone]
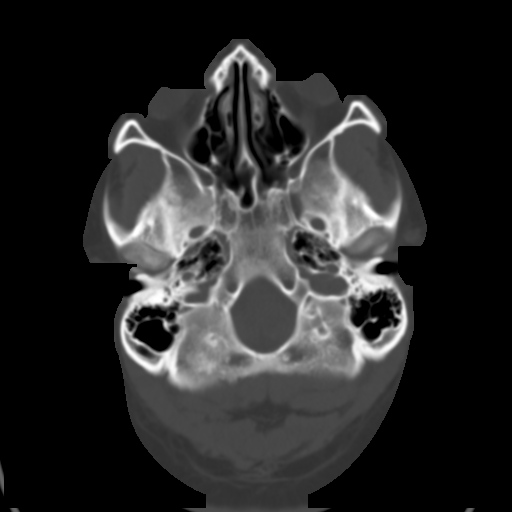
[im 6/30  brain]
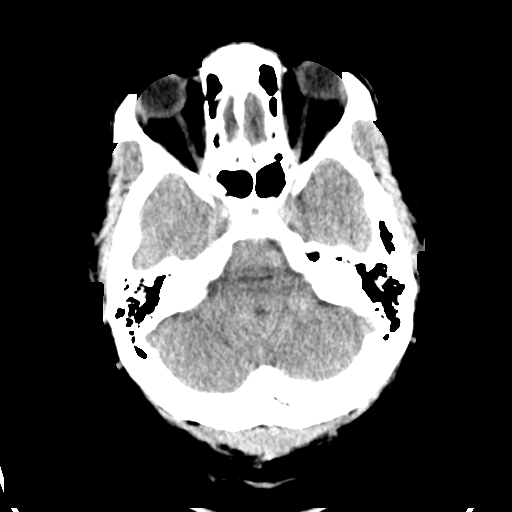
[im 9/30  brain]
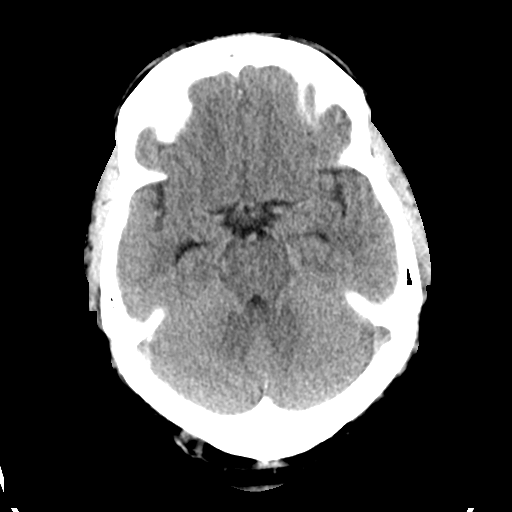
[im 12/30  brain]
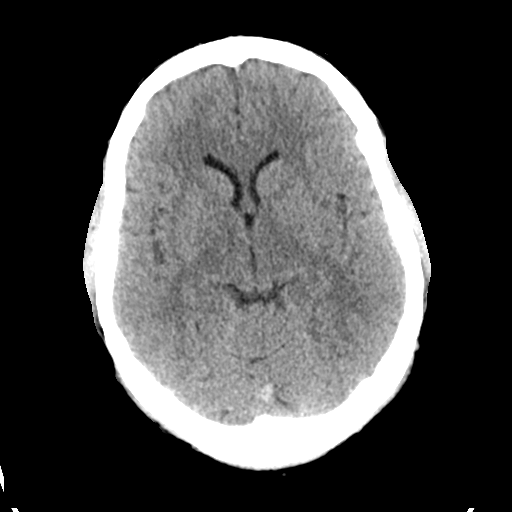
[im 16/30  brain]
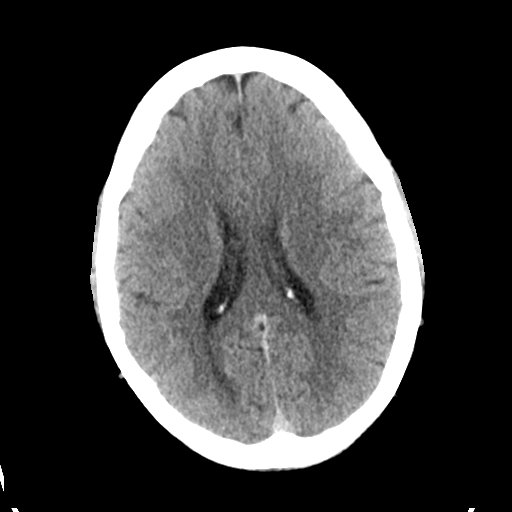
[im 16/30  bone]
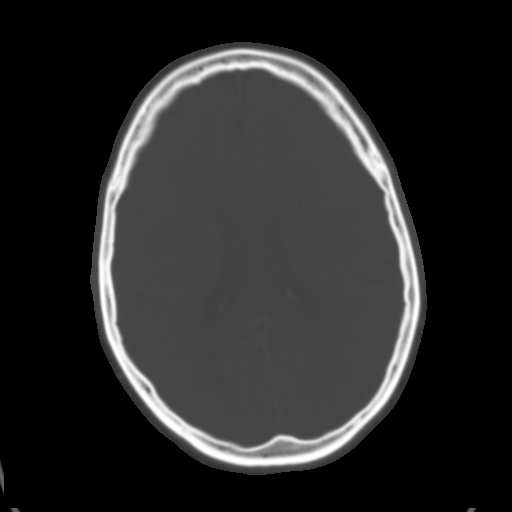
[im 19/30  brain]
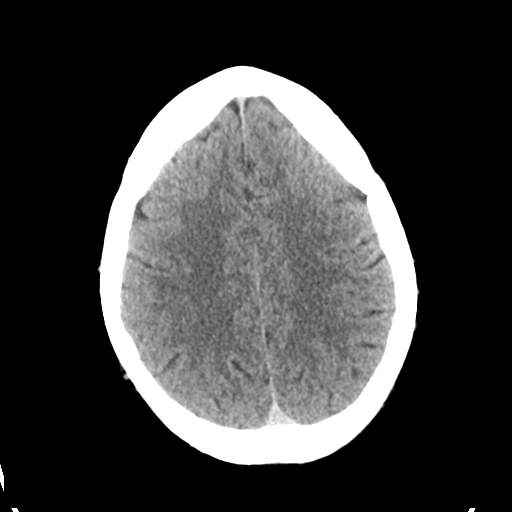
[im 22/30  brain]
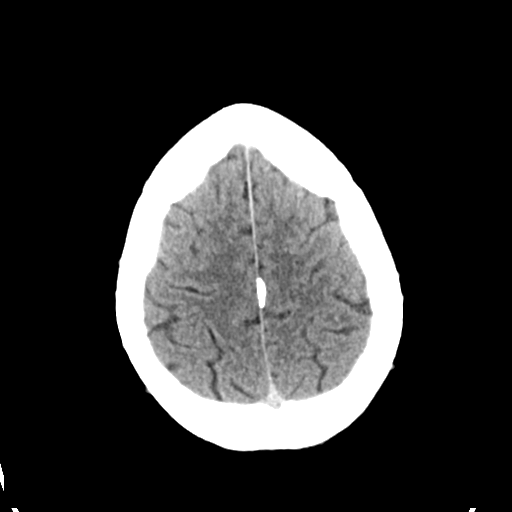
[im 25/30  brain]
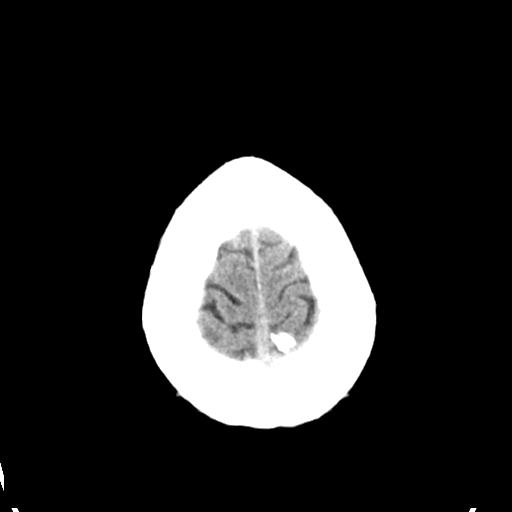
[im 28/30  brain]
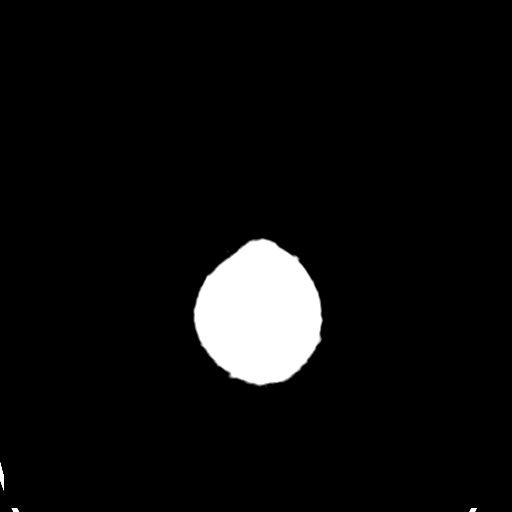
[im 28/30  bone]
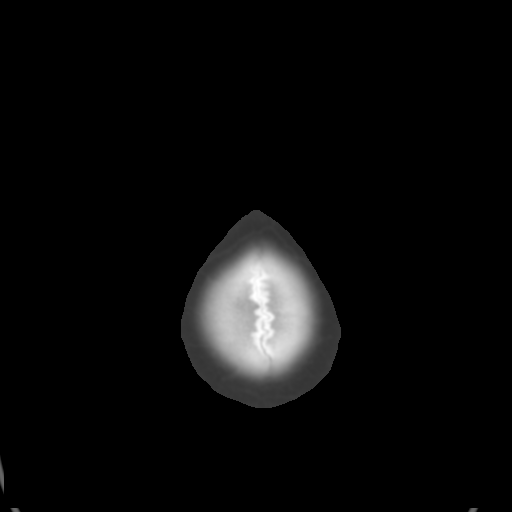

[Series 4: coronal soft tissue · coronal · 0.30mm/px · 3 of 66 slices shown]
[im 22/66  brain]
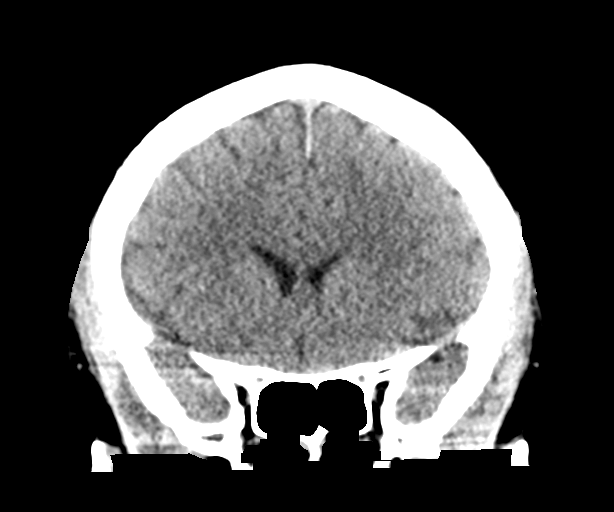
[im 29/66  brain]
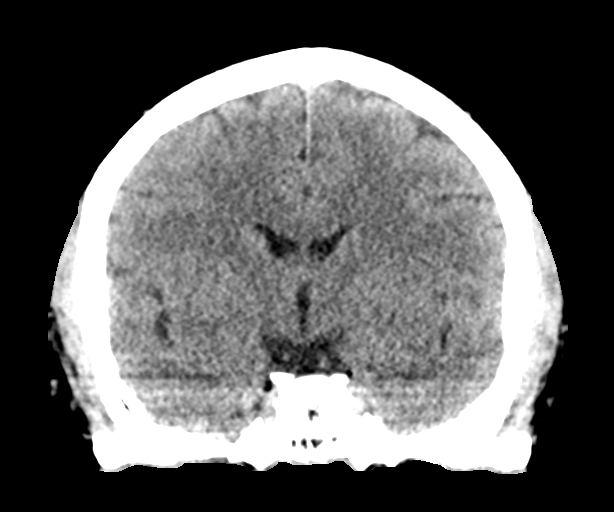
[im 37/66  brain]
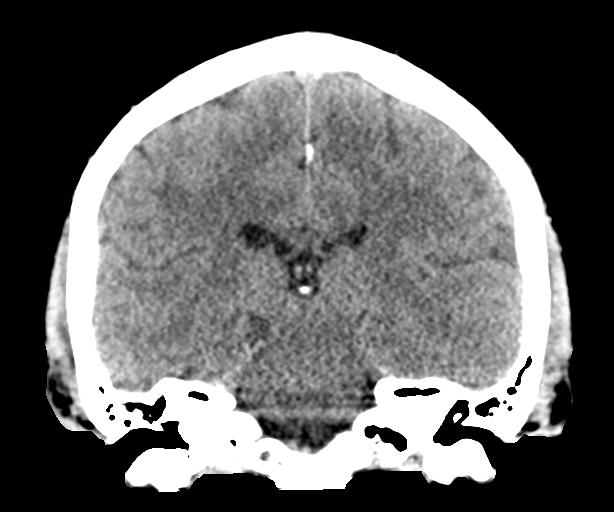

[Series 5: sagittal soft tissue · sagittal · 0.30mm/px · 3 of 55 slices shown]
[im 19/55  brain]
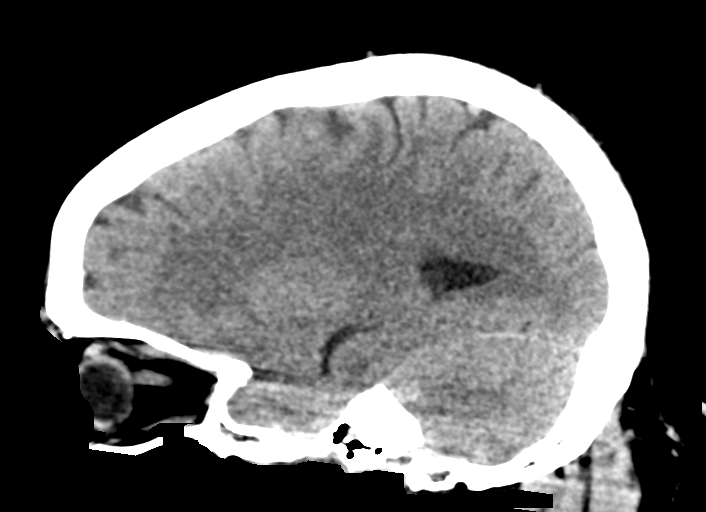
[im 28/55  brain]
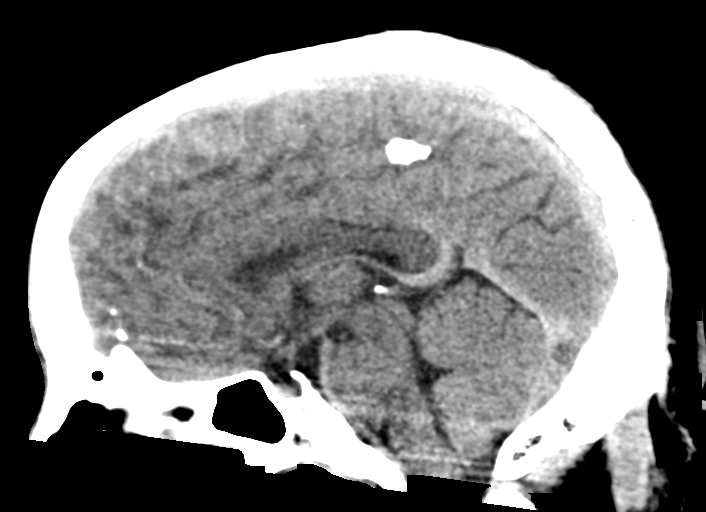
[im 37/55  brain]
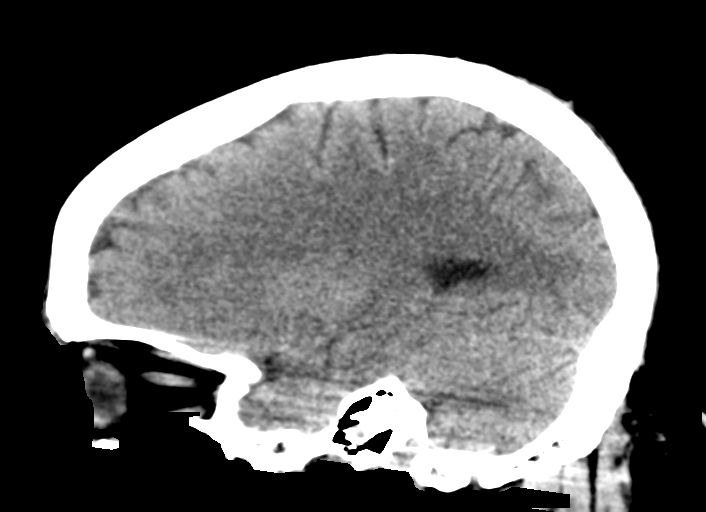

[15 of 47 positions shown; findings below may reference images not displayed]

FINDINGS: Brain: No evidence of acute infarction, hemorrhage, hydrocephalus,
extra-axial collection or mass lesion/mass effect.

Vascular: No hyperdense vessel or unexpected calcification.

Skull: Normal. Negative for fracture or focal lesion.

Sinuses/Orbits: No acute finding.

Other: None.
IMPRESSION: Normal head CT.

## 2016-11-01 NOTE — Telephone Encounter (Signed)
-----   Message from Tamsen Roersennis E Chrismon, [place] sent at 11/01/2016  2:41 PM EDT ----- Normal CT scan of head. No chronic sinusitis or intracranial lesions. If headaches persist, should schedule evaluation by neurologist for further evaluation.

## 2016-11-01 NOTE — Telephone Encounter (Signed)
LMTCB

## 2016-11-02 LAB — TESTOSTERONE: Testosterone: 161 ng/dL — ABNORMAL LOW (ref 264–916)

## 2016-11-03 ENCOUNTER — Other Ambulatory Visit: Payer: Self-pay | Admitting: Family Medicine

## 2016-11-03 DIAGNOSIS — I10 Essential (primary) hypertension: Secondary | ICD-10-CM

## 2016-11-06 NOTE — Telephone Encounter (Signed)
Letter mailed to patient with normal imaging results to address in chart.

## 2016-11-12 LAB — FSH/LH

## 2016-11-12 LAB — PROLACTIN

## 2016-11-15 ENCOUNTER — Encounter: Payer: Self-pay | Admitting: Family Medicine

## 2016-11-15 ENCOUNTER — Ambulatory Visit (INDEPENDENT_AMBULATORY_CARE_PROVIDER_SITE_OTHER): Payer: 59 | Admitting: Family Medicine

## 2016-11-15 VITALS — BP 124/78 | HR 92 | Temp 98.1°F | Resp 16 | Wt 293.0 lb

## 2016-11-15 DIAGNOSIS — R0683 Snoring: Secondary | ICD-10-CM | POA: Diagnosis not present

## 2016-11-15 DIAGNOSIS — R0681 Apnea, not elsewhere classified: Secondary | ICD-10-CM

## 2016-11-15 DIAGNOSIS — I10 Essential (primary) hypertension: Secondary | ICD-10-CM

## 2016-11-15 MED ORDER — LISINOPRIL-HYDROCHLOROTHIAZIDE 20-25 MG PO TABS: 1.0000 | ORAL_TABLET | Freq: Every day | ORAL | 3 refills | Status: DC

## 2016-11-15 NOTE — Progress Notes (Signed)
Patient: Alejandro PeerMichael Doubrava Male    DOB: 1980/07/12   36 y.o.   MRN: [id number] Visit Date: 11/15/2016  Today's Provider: Dortha Kernennis Aira Sallade, PA   Chief Complaint  Patient presents with  . Hypertension   Subjective:    HPI      Hypertension, follow-up:  BP Readings from Last 3 Encounters:  11/15/16 124/78  10/24/16 (!) 149/107  10/24/16 (!) 154/116    He was last seen for hypertension 3 weeks ago.  BP at that visit was 154/116. Management since that visit includes increasing Lisinopril-HCTZ to 2 tablets daily. Head CT Was checked for headaches, which was normal. Labs were checked, and creatinine was improving.  He reports excellent compliance with treatment. He is not having side effects.  He is exercising. Walks daily. He is adherent to low salt diet.   Outside blood pressures are 130's-140's/90's. Pt is concerned because his pulse rate runs over 100 in the mornings.  He is experiencing none.  Patient denies chest pain, chest pressure/discomfort, claudication, dyspnea, exertional chest pressure/discomfort, fatigue, irregular heart beat, lower extremity edema, near-syncope, orthopnea, palpitations and syncope.   Cardiovascular risk factors include hypertension, male gender and obesity (BMI >= 30 kg/m2).    Weight trend: increasing steadily Wt Readings from Last 3 Encounters:  11/15/16 293 lb (132.9 kg)  10/24/16 291 lb (132 kg)  10/24/16 291 lb 9.6 oz (132.3 kg)    Current diet: in general, a "healthy" diet    ------------------------------------------------------------------------ Pt reports his wife has noticed apneic episodes while pt is sleeping.  Patient Active Problem List   Diagnosis Date Noted  . Essential hypertension 10/07/2016   Past Surgical History:  Procedure Laterality Date  . HAND SURGERY Right    right arm  x 6   Family History  Problem Relation Age of Onset  . Kidney cancer Neg Hx   . Bladder Cancer Neg Hx   . Prostate cancer Neg Hx      No Known Allergies  Current Outpatient Prescriptions:  .  lisinopril-hydrochlorothiazide (PRINZIDE,ZESTORETIC) 10-12.5 MG tablet, Take 1 tablet by mouth daily. (Patient taking differently: Take 2 tablets by mouth daily. ), Disp: 90 tablet, Rfl: 3 .  metoprolol succinate (TOPROL-XL) 50 MG 24 hr tablet, TAKE 1 TABLET BY MOUTH EVERY DAY WITH OR IMMEDIATELY FOLLOWING A MEAL, Disp: 30 tablet, Rfl: 0 .  pregabalin (LYRICA) 50 MG capsule, Take 50 mg by mouth 3 (three) times daily., Disp: , Rfl:   Review of Systems  Constitutional: Negative for activity change, appetite change, chills, diaphoresis, fatigue, fever and unexpected weight change.  Respiratory: Positive for apnea. Negative for shortness of breath.   Cardiovascular: Negative for chest pain, palpitations and leg swelling.    Social History  Substance Use Topics  . Smoking status: Never Smoker  . Smokeless tobacco: Never Used  . Alcohol use No   Objective:   BP 124/78 (BP Location: Right Arm, Patient Position: Sitting, Cuff Size: Large)   Pulse 92   Temp 98.1 F (36.7 C) (Oral)   Resp 16   Wt 293 lb (132.9 kg)   BMI 44.55 kg/m  Vitals:   11/15/16 1547  BP: 124/78  Pulse: 92  Resp: 16  Temp: 98.1 F (36.7 C)  TempSrc: Oral  Weight: 293 lb (132.9 kg)   Physical Exam  Constitutional: He is oriented to person, place, and time. He appears well-developed and well-nourished. No distress.  HENT:  Head: Normocephalic and atraumatic.  Right Ear: Hearing normal.  Left Ear: Hearing normal.  Nose: Nose normal.  Eyes: Conjunctivae and lids are normal. Right eye exhibits no discharge. Left eye exhibits no discharge. No scleral icterus.  Neck: Neck supple.  Cardiovascular: Normal rate and regular rhythm.   Pulmonary/Chest: Effort normal and breath sounds normal. No respiratory distress.  Abdominal: Soft. Bowel sounds are normal.  Musculoskeletal: Normal range of motion.  Neurological: He is alert and oriented to person, place,  and time.  Skin: Skin is intact. No lesion and no rash noted.  Psychiatric: He has a normal mood and affect. His speech is normal and behavior is normal. Thought content normal.      Assessment & Plan:     1. Essential hypertension Better control of BP with increase in Prinzide to 20-25 mg qd. Will refill this medication and plan recheck of BP after sleep study. - lisinopril-hydrochlorothiazide (PRINZIDE,ZESTORETIC) 20-25 MG tablet; Take 1 tablet by mouth daily.  Dispense: 90 tablet; Refill: 3  2. Snoring Loud snoring and wife has witnessed episodes of apnea. Will schedule sleep study at home. May need CPAP. - Ambulatory referral to Sleep Studies  3. Witnessed episode of apnea Wife has seen episodes of apnea at night. No significant daytime sleeping except some naps occasionally. Very loud snoring. Will schedule sleep study. - Ambulatory referral to Sleep Studies       Dortha Kernennis Tj Kitchings, PA  [facility]

## 2016-12-03 ENCOUNTER — Other Ambulatory Visit: Payer: Self-pay | Admitting: Family Medicine

## 2016-12-03 DIAGNOSIS — I10 Essential (primary) hypertension: Secondary | ICD-10-CM

## 2017-01-24 ENCOUNTER — Ambulatory Visit: Payer: 59 | Attending: Otolaryngology

## 2017-01-24 DIAGNOSIS — F5101 Primary insomnia: Secondary | ICD-10-CM | POA: Insufficient documentation

## 2017-01-24 DIAGNOSIS — I10 Essential (primary) hypertension: Secondary | ICD-10-CM | POA: Diagnosis not present

## 2017-01-24 DIAGNOSIS — G4733 Obstructive sleep apnea (adult) (pediatric): Secondary | ICD-10-CM | POA: Diagnosis present

## 2017-01-28 DIAGNOSIS — J020 Streptococcal pharyngitis: Secondary | ICD-10-CM | POA: Diagnosis not present

## 2017-03-17 ENCOUNTER — Telehealth: Payer: Self-pay | Admitting: Family Medicine

## 2017-03-17 NOTE — Telephone Encounter (Signed)
[facility] has not been able to contact this patient regarding his sleep test. Be sure we have sent them the correct phone number to contact this patient.

## 2017-03-17 NOTE — Telephone Encounter (Signed)
-----   Message from Bridgett Larssonaroline D Couch sent at 03/17/2017  4:21 PM EST ----- Review incoming fax

## 2017-03-18 NOTE — Telephone Encounter (Signed)
Left message for patient to return call to make him aware that [facility] has been attempting to reach him for a sleep study.

## 2017-11-17 ENCOUNTER — Other Ambulatory Visit: Payer: Self-pay | Admitting: Family Medicine

## 2017-11-17 DIAGNOSIS — I10 Essential (primary) hypertension: Secondary | ICD-10-CM

## 2017-11-17 NOTE — Telephone Encounter (Signed)
He is going to need an appt soon for BP f/u or CPE if needed. I am ok to see if patient ok with male.

## 2017-11-17 NOTE — Telephone Encounter (Signed)
Left patient a message advising him that a 30 day supply has been sent to pharmacy and call office to schedule follow up appointment or CPE before further refills.

## 2017-12-16 ENCOUNTER — Other Ambulatory Visit: Payer: Self-pay | Admitting: Physician Assistant

## 2017-12-16 DIAGNOSIS — I10 Essential (primary) hypertension: Secondary | ICD-10-CM

## 2018-01-09 ENCOUNTER — Ambulatory Visit (INDEPENDENT_AMBULATORY_CARE_PROVIDER_SITE_OTHER): Payer: Commercial Managed Care - PPO | Admitting: Family Medicine

## 2018-01-09 ENCOUNTER — Encounter: Payer: Self-pay | Admitting: Family Medicine

## 2018-01-09 VITALS — BP 130/92 | HR 88 | Temp 98.5°F | Ht 68.0 in | Wt 244.0 lb

## 2018-01-09 DIAGNOSIS — I10 Essential (primary) hypertension: Secondary | ICD-10-CM | POA: Diagnosis not present

## 2018-01-09 DIAGNOSIS — Z0000 Encounter for general adult medical examination without abnormal findings: Secondary | ICD-10-CM

## 2018-01-09 DIAGNOSIS — Z114 Encounter for screening for human immunodeficiency virus [HIV]: Secondary | ICD-10-CM

## 2018-01-09 DIAGNOSIS — Z2821 Immunization not carried out because of patient refusal: Secondary | ICD-10-CM | POA: Diagnosis not present

## 2018-01-09 NOTE — Progress Notes (Signed)
Patient: Alejandro Santos, Male    DOB: 1981/01/13, 37 y.o.   MRN: [id number] Visit Date: 01/09/2018  Today's Provider: Dortha Kern, PA   Chief Complaint  Patient presents with  . Annual Exam   Subjective:    Annual physical exam Alejandro Santos is a 37 y.o. male who presents today for health maintenance and complete physical. He feels well. He reports exercising regularly. He reports he is sleeping well.  -----------------------------------------------------------------   Review of Systems  Constitutional: Negative.   HENT: Negative.   Eyes: Negative.   Respiratory: Negative.   Cardiovascular: Negative.   Gastrointestinal: Negative.   Endocrine: Negative.   Genitourinary: Negative.   Musculoskeletal: Negative.   Skin: Negative.   Allergic/Immunologic: Negative.   Neurological: Negative.   Hematological: Negative.   Psychiatric/Behavioral: Negative.     Social History      He  reports that he has never smoked. He has never used smokeless tobacco. He reports that he does not drink alcohol or use drugs.       Social History   Socioeconomic History  . Marital status: Married    Spouse name: Not on file  . Number of children: Not on file  . Years of education: Not on file  . Highest education level: Not on file  Occupational History  . Not on file  Social Needs  . Financial resource strain: Not on file  . Food insecurity:    Worry: Not on file    Inability: Not on file  . Transportation needs:    Medical: Not on file    Non-medical: Not on file  Tobacco Use  . Smoking status: Never Smoker  . Smokeless tobacco: Never Used  Substance and Sexual Activity  . Alcohol use: No  . Drug use: No  . Sexual activity: Not on file  Lifestyle  . Physical activity:    Days per week: Not on file    Minutes per session: Not on file  . Stress: Not on file  Relationships  . Social connections:    Talks on phone: Not on file    Gets together: Not on file   Attends religious service: Not on file    Active member of club or organization: Not on file    Attends meetings of clubs or organizations: Not on file    Relationship status: Not on file  Other Topics Concern  . Not on file  Social History Narrative  . Not on file    No past medical history on file.   Patient Active Problem List   Diagnosis Date Noted  . Essential hypertension 10/07/2016    Past Surgical History:  Procedure Laterality Date  . HAND SURGERY Right    right arm  x 6  . KNEE SURGERY Left     Family History        Family Status  Relation Name Status  . Mother  Deceased  . Father  Alive  . Sister  Alive  . Brother  Alive  . Neg Hx  (Not Specified)        His family history includes Breast cancer in his mother; Healthy in his father; Hypertension in his father. There is no history of Kidney cancer, Bladder Cancer, or Prostate cancer.      No Known Allergies   Current Outpatient Medications:  .  lisinopril-hydrochlorothiazide (PRINZIDE,ZESTORETIC) 20-25 MG tablet, TAKE 1 TABLET BY MOUTH EVERY DAY, Disp: 30 tablet, Rfl: 0 .  metoprolol  succinate (TOPROL-XL) 50 MG 24 hr tablet, TAKE 1 TABLET BY MOUTH EVERY DAY WITH OR IMMEDIATELY FOLLOWING A MEAL, Disp: 90 tablet, Rfl: 3 .  pregabalin (LYRICA) 50 MG capsule, Take 50 mg by mouth 3 (three) times daily., Disp: , Rfl:    Patient Care Team: Alitza Cowman, Jodell Cipro, PA as PCP - General (Family Medicine)      Objective:   Vitals: Pulse 88   Temp 98.5 F (36.9 C) (Oral)   Ht 5\' 8"  (1.727 m)   Wt 244 lb (110.7 kg)   SpO2 99%   BMI 37.10 kg/m    Wt Readings from Last 3 Encounters:  01/09/18 244 lb (110.7 kg)  11/15/16 293 lb (132.9 kg)  10/24/16 291 lb (132 kg)    Vitals:   01/09/18 0836  Pulse: 88  Temp: 98.5 F (36.9 C)  TempSrc: Oral  SpO2: 99%  Weight: 244 lb (110.7 kg)  Height: 5\' 8"  (1.727 m)    Physical Exam  Constitutional: He is oriented to person, place, and time. He appears  well-developed and well-nourished.  HENT:  Head: Normocephalic and atraumatic.  Right Ear: External ear normal.  Left Ear: External ear normal.  Nose: Nose normal.  Mouth/Throat: Oropharynx is clear and moist.  Eyes: Pupils are equal, round, and reactive to light. Conjunctivae and EOM are normal. Right eye exhibits no discharge.  Neck: Normal range of motion. Neck supple. No tracheal deviation present. No thyromegaly present.  Cardiovascular: Normal rate, regular rhythm, normal heart sounds and intact distal pulses.  No murmur heard. Pulmonary/Chest: Effort normal and breath sounds normal. No respiratory distress. He has no wheezes. He has no rales. He exhibits no tenderness.  Abdominal: Soft. He exhibits no distension and no mass. There is no tenderness. There is no rebound and no guarding.  Genitourinary: Penis normal.  Musculoskeletal: Normal range of motion. He exhibits no edema or tenderness.  Some crepitus in left patella with history of arthroscopic debridement in 2001 after football injury. History of left shoulder rotator cuff tear surgery in 1998. Has full ROM throughout.  Lymphadenopathy:    He has no cervical adenopathy.  Neurological: He is alert and oriented to person, place, and time. He has normal reflexes. He displays normal reflexes. No cranial nerve deficit. He exhibits normal muscle tone. Coordination normal.  Skin: Skin is warm and dry. No rash noted. No erythema.  Psychiatric: He has a normal mood and affect. His behavior is normal. Judgment and thought content normal.    Depression Screen PHQ 2/9 Scores 10/24/2016  PHQ - 2 Score 0  PHQ- 9 Score 4   Assessment & Plan:     Routine Health Maintenance and Physical Exam  Exercise Activities and Dietary recommendations Goals   None     There is no immunization history on file for this patient.  Health Maintenance  Topic Date Due  . HIV Screening  11/12/1995  . INFLUENZA VACCINE  11/20/2017  . TETANUS/TDAP   06/01/2023     Discussed health benefits of physical activity, and encouraged him to engage in regular exercise appropriate for his age and condition.    -------------------------------------------------------------------- 1. Annual physical exam General health improved and stable. Immunizations up to date (refuses flu shot). Given anticipatory counseling and recheck routine labs. Will complete CTE Physician Results Form when lab report returns. Continue activities and caloric reduction to lose a little more weight. - CBC with Differential/Platelet - Comprehensive metabolic panel - Lipid panel - TSH  2. Essential hypertension  Continues to tolerate Lisinopril-HCTZ 20-25 mg qd and Metoprolol succinate 50 mg qd. Restricts salt in diet and has been working in a more physical job as a Copywriter, advertisinglineman. Has lost 49 lbs in the past year. BP 140/98 today (has not taken his BP meds and a little anxious in the office). Will recheck labs and follow up BP pending reports. Continue present dosage regimen. - CBC with Differential/Platelet - Comprehensive metabolic panel - Lipid panel - TSH  3. Screening for HIV (human immunodeficiency virus) Asymptomatic and monogamous relationship with wife. - HIV Antibody (routine testing w rflx)  4. Influenza vaccination declined by patient     Dortha Kernennis Nizhoni Parlow, PA  [facility]

## 2018-01-10 LAB — CBC WITH DIFFERENTIAL/PLATELET
BASOS: 1 %
Basophils Absolute: 0 10*3/uL (ref 0.0–0.2)
EOS (ABSOLUTE): 0.1 10*3/uL (ref 0.0–0.4)
Eos: 2 %
HEMATOCRIT: 46.8 % (ref 37.5–51.0)
Hemoglobin: 15.6 g/dL (ref 13.0–17.7)
IMMATURE GRANS (ABS): 0 10*3/uL (ref 0.0–0.1)
IMMATURE GRANULOCYTES: 0 %
LYMPHS: 43 %
Lymphocytes Absolute: 2.3 10*3/uL (ref 0.7–3.1)
MCH: 27.8 pg (ref 26.6–33.0)
MCHC: 33.3 g/dL (ref 31.5–35.7)
MCV: 83 fL (ref 79–97)
MONOCYTES: 10 %
Monocytes Absolute: 0.5 10*3/uL (ref 0.1–0.9)
NEUTROS ABS: 2.3 10*3/uL (ref 1.4–7.0)
NEUTROS PCT: 44 %
Platelets: 425 10*3/uL (ref 150–450)
RBC: 5.62 x10E6/uL (ref 4.14–5.80)
RDW: 13.7 % (ref 12.3–15.4)
WBC: 5.3 10*3/uL (ref 3.4–10.8)

## 2018-01-10 LAB — COMPREHENSIVE METABOLIC PANEL
A/G RATIO: 1.6 (ref 1.2–2.2)
ALT: 12 IU/L (ref 0–44)
AST: 17 IU/L (ref 0–40)
Albumin: 4.5 g/dL (ref 3.5–5.5)
Alkaline Phosphatase: 55 IU/L (ref 39–117)
BILIRUBIN TOTAL: 0.5 mg/dL (ref 0.0–1.2)
BUN / CREAT RATIO: 9 (ref 9–20)
BUN: 11 mg/dL (ref 6–20)
CALCIUM: 9.4 mg/dL (ref 8.7–10.2)
CHLORIDE: 99 mmol/L (ref 96–106)
CO2: 24 mmol/L (ref 20–29)
Creatinine, Ser: 1.29 mg/dL — ABNORMAL HIGH (ref 0.76–1.27)
GFR, EST AFRICAN AMERICAN: 81 mL/min/{1.73_m2} (ref >59)
GFR, EST NON AFRICAN AMERICAN: 70 mL/min/{1.73_m2} (ref >59)
Globulin, Total: 2.9 g/dL (ref 1.5–4.5)
Glucose: 90 mg/dL (ref 65–99)
POTASSIUM: 4.3 mmol/L (ref 3.5–5.2)
Sodium: 141 mmol/L (ref 134–144)
TOTAL PROTEIN: 7.4 g/dL (ref 6.0–8.5)

## 2018-01-10 LAB — LIPID PANEL
CHOL/HDL RATIO: 4.1 ratio (ref 0.0–5.0)
Cholesterol, Total: 170 mg/dL (ref 100–199)
HDL: 41 mg/dL (ref >39)
LDL Calculated: 112 mg/dL — ABNORMAL HIGH (ref 0–99)
Triglycerides: 86 mg/dL (ref 0–149)
VLDL CHOLESTEROL CAL: 17 mg/dL (ref 5–40)

## 2018-01-10 LAB — TSH: TSH: 1.96 u[IU]/mL (ref 0.450–4.500)

## 2018-01-12 ENCOUNTER — Telehealth: Payer: Self-pay

## 2018-01-12 NOTE — Telephone Encounter (Signed)
-----   Message from Tamsen Roersennis E Chrismon, [place] sent at 01/11/2018  5:11 PM EDT ----- Blood tests essentially normal with improvement in creatinine/kidney function. LDL above goal of < 100 at 112. Continue low fat diet and exercise trying to lose more weight. Recheck progress in 6 months.

## 2018-01-12 NOTE — Telephone Encounter (Signed)
Patient advised as below.  

## 2018-01-27 ENCOUNTER — Other Ambulatory Visit: Payer: Self-pay | Admitting: Family Medicine

## 2018-01-27 DIAGNOSIS — I10 Essential (primary) hypertension: Secondary | ICD-10-CM

## 2018-07-09 NOTE — Progress Notes (Signed)
Patient: Alejandro Santos Male    DOB: 1980-11-29   37 y.o.   MRN: [id number] Visit Date: 07/13/2018  Today's Provider: Dortha Kern, PA   Chief Complaint  Patient presents with  . Follow-up  . Hypertension   Subjective:     HPI   Hypertension, follow-up:  BP Readings from Last 3 Encounters:  07/13/18 110/84  01/09/18 (!) 130/92  11/15/16 124/78    He was last seen for hypertension 6 months ago.  BP at that visit was 130/92. Management since that visit includes; labs checked. Advised diet and exercise.He reports good compliance with treatment. He is not having side effects. none He is exercising. He is adherent to low salt diet.   Outside blood pressures are 145/82. He is experiencing none.  Patient denies none.   Cardiovascular risk factors include none.  Use of agents associated with hypertension: none.   -------------------------------------------------------------------  History reviewed. No pertinent past medical history. Patient Active Problem List   Diagnosis Date Noted  . Essential hypertension 10/07/2016   Past Surgical History:  Procedure Laterality Date  . HAND SURGERY Right    right arm  x 6  . KNEE SURGERY Left    Family History  Problem Relation Age of Onset  . Breast cancer Mother   . Healthy Father   . Hypertension Father   . Kidney cancer Neg Hx   . Bladder Cancer Neg Hx   . Prostate cancer Neg Hx    No Known Allergies  Current Outpatient Medications:  .  lisinopril-hydrochlorothiazide (PRINZIDE,ZESTORETIC) 20-25 MG tablet, TAKE 1 TABLET BY MOUTH EVERY DAY, Disp: 90 tablet, Rfl: 4 .  metoprolol succinate (TOPROL-XL) 50 MG 24 hr tablet, TAKE 1 TABLET BY MOUTH EVERY DAY WITH OR IMMEDIATELY FOLLOWING A MEAL, Disp: 90 tablet, Rfl: 3 .  Misc Natural Products (HORNY GOAT WEED PO), Take 100 mg by mouth., Disp: , Rfl:  .  NON FORMULARY, daily. Reishi Mushroom Supplement, Disp: , Rfl:  .  pregabalin (LYRICA) 50 MG capsule, Take 50 mg  by mouth 3 (three) times daily., Disp: , Rfl:   Review of Systems  Constitutional: Negative for appetite change, chills and fever.  Respiratory: Negative for chest tightness, shortness of breath and wheezing.   Cardiovascular: Negative for chest pain and palpitations.  Gastrointestinal: Negative for abdominal pain, nausea and vomiting.   Social History   Tobacco Use  . Smoking status: Never Smoker  . Smokeless tobacco: Never Used  Substance Use Topics  . Alcohol use: No     Objective:   BP 110/84 (BP Location: Right Arm, Patient Position: Sitting, Cuff Size: Large)   Pulse 90   Temp 98.4 F (36.9 C) (Oral)   Resp 16   Ht 5\' 8"  (1.727 m)   Wt 262 lb (118.8 kg)   SpO2 92%   BMI 39.84 kg/m    Wt Readings from Last 3 Encounters:  07/13/18 262 lb (118.8 kg)  01/09/18 244 lb (110.7 kg)  11/15/16 293 lb (132.9 kg)   BP Readings from Last 3 Encounters:  07/13/18 110/84  01/09/18 (!) 130/92  11/15/16 124/78   Vitals:   07/13/18 0815  BP: 110/84  Pulse: 90  Resp: 16  Temp: 98.4 F (36.9 C)  TempSrc: Oral  SpO2: 92%  Weight: 262 lb (118.8 kg)  Height: 5\' 8"  (1.727 m)   Physical Exam Constitutional:      Appearance: He is well-developed.  HENT:     Head: Normocephalic  and atraumatic.     Right Ear: External ear normal.     Left Ear: External ear normal.     Nose: Nose normal.  Eyes:     General:        Right eye: No discharge.     Conjunctiva/sclera: Conjunctivae normal.     Pupils: Pupils are equal, round, and reactive to light.  Neck:     Musculoskeletal: Normal range of motion and neck supple.     Thyroid: No thyromegaly.     Trachea: No tracheal deviation.  Cardiovascular:     Rate and Rhythm: Normal rate and regular rhythm.     Heart sounds: Normal heart sounds. No murmur.  Pulmonary:     Effort: Pulmonary effort is normal. No respiratory distress.     Breath sounds: Normal breath sounds. No wheezing or rales.  Chest:     Chest wall: No tenderness.   Abdominal:     General: There is no distension.     Palpations: Abdomen is soft. There is no mass.     Tenderness: There is no abdominal tenderness. There is no guarding or rebound.  Genitourinary:    Penis: Normal.      Scrotum/Testes: Normal.     Prostate: Normal.     Rectum: Normal. Guaiac result negative.  Musculoskeletal: Normal range of motion.        General: No tenderness.     Comments: Well healed scar right wrist over the radial ventral surface. Good grip strength. Occasional tingling and discomfort with strenuous activities.  Lymphadenopathy:     Cervical: No cervical adenopathy.  Skin:    General: Skin is warm and dry.     Findings: No erythema or rash.  Neurological:     Mental Status: He is alert and oriented to person, place, and time.     Cranial Nerves: No cranial nerve deficit.     Motor: No abnormal muscle tone.     Coordination: Coordination normal.     Deep Tendon Reflexes: Reflexes are normal and symmetric. Reflexes normal.  Psychiatric:        Behavior: Behavior normal.        Thought Content: Thought content normal.        Judgment: Judgment normal.       Assessment & Plan    1. Essential hypertension Well controlled and tolerating Toprol-XL 50 mg qd with Prinzide 20-25 mg qd. Labs in September essentially normal. Will refill medications and recheck BP in 6 months. - metoprolol succinate (TOPROL-XL) 50 MG 24 hr tablet; TAKE 1 TABLET BY MOUTH EVERY DAY WITH OR IMMEDIATELY FOLLOWING A MEAL  Dispense: 90 tablet; Refill: 3  2. Obesity, Class II, BMI 35-39.9 BMI up again (39.84). Has gained 18 lbs since September 2019. Encouraged to restrict caloric intake and exercise 30-40 minutes 3-4 days a week. Recheck progress in 6 months.  3. Periodic health assessment, general screening, adult Completed CPE with [employer] for health screening required at work. Last Tdap was in 2015. Recheck prn.  4. Mononeuropathy History of surgery by Dr. Butler Denmark with well healed scar  volar surface of distal radius. Occasional tingling and neuropathy with strenuous activities. Will resolve with rest.     Dortha Kern, PA  [facility]

## 2018-07-13 ENCOUNTER — Other Ambulatory Visit: Payer: Self-pay

## 2018-07-13 ENCOUNTER — Ambulatory Visit (INDEPENDENT_AMBULATORY_CARE_PROVIDER_SITE_OTHER): Payer: Commercial Managed Care - PPO | Admitting: Family Medicine

## 2018-07-13 ENCOUNTER — Encounter: Payer: Self-pay | Admitting: Family Medicine

## 2018-07-13 VITALS — BP 110/84 | HR 90 | Temp 98.4°F | Resp 16 | Ht 68.0 in | Wt 262.0 lb

## 2018-07-13 DIAGNOSIS — Z0000 Encounter for general adult medical examination without abnormal findings: Secondary | ICD-10-CM

## 2018-07-13 DIAGNOSIS — I10 Essential (primary) hypertension: Secondary | ICD-10-CM | POA: Diagnosis not present

## 2018-07-13 DIAGNOSIS — E669 Obesity, unspecified: Secondary | ICD-10-CM | POA: Diagnosis not present

## 2018-07-13 DIAGNOSIS — G589 Mononeuropathy, unspecified: Secondary | ICD-10-CM

## 2018-07-13 MED ORDER — METOPROLOL SUCCINATE ER 50 MG PO TB24: ORAL_TABLET | ORAL | 3 refills | Status: DC

## 2019-01-18 ENCOUNTER — Ambulatory Visit: Payer: Commercial Managed Care - PPO | Admitting: Family Medicine

## 2019-01-25 ENCOUNTER — Ambulatory Visit: Payer: Commercial Managed Care - PPO | Admitting: Family Medicine

## 2019-02-01 ENCOUNTER — Ambulatory Visit (INDEPENDENT_AMBULATORY_CARE_PROVIDER_SITE_OTHER): Payer: Commercial Managed Care - PPO | Admitting: Family Medicine

## 2019-02-01 ENCOUNTER — Other Ambulatory Visit: Payer: Self-pay

## 2019-02-01 ENCOUNTER — Encounter: Payer: Self-pay | Admitting: Family Medicine

## 2019-02-01 VITALS — BP 128/86 | HR 74 | Temp 97.3°F | Resp 16 | Ht 68.0 in | Wt 282.0 lb

## 2019-02-01 DIAGNOSIS — I10 Essential (primary) hypertension: Secondary | ICD-10-CM | POA: Diagnosis not present

## 2019-02-01 DIAGNOSIS — E669 Obesity, unspecified: Secondary | ICD-10-CM | POA: Diagnosis not present

## 2019-02-01 NOTE — Progress Notes (Signed)
Alejandro Santos  MRN: [id number] DOB: [birth year]  Subjective:  HPI   The patient is a 38 year old male who presents today for follow up of his hypertension.  He was last seen on 07/13/18.  His blood pressure reading from that visit was stable.  He was instructed at that time to continue with his medications.  BP Readings from Last 3 Encounters:  02/01/19 128/86  07/13/18 110/84  01/09/18 (!) 130/92   Patient was also seen at that time for his obesity.  It was noted on his last visit that his weight was up by 18 pounds from the visit 6 month earlier.  He was encouraged to work on habits and follow up today.  His BMI on that visit was 39.84.  Wt Readings from Last 3 Encounters:  02/01/19 282 lb (127.9 kg)  07/13/18 262 lb (118.8 kg)  01/09/18 244 lb (110.7 kg)   Patient Active Problem List   Diagnosis Date Noted  . Essential hypertension 10/07/2016   No past medical history on file.  Past Surgical History:  Procedure Laterality Date  . HAND SURGERY Right    right arm  x 6  . KNEE SURGERY Left    Family History  Problem Relation Age of Onset  . Breast cancer Mother   . Healthy Father   . Hypertension Father   . Kidney cancer Neg Hx   . Bladder Cancer Neg Hx   . Prostate cancer Neg Hx    Social History   Socioeconomic History  . Marital status: Married    Spouse name: Not on file  . Number of children: Not on file  . Years of education: Not on file  . Highest education level: Not on file  Occupational History  . Not on file  Social Needs  . Financial resource strain: Not on file  . Food insecurity    Worry: Not on file    Inability: Not on file  . Transportation needs    Medical: Not on file    Non-medical: Not on file  Tobacco Use  . Smoking status: Never Smoker  . Smokeless tobacco: Never Used  Substance and Sexual Activity  . Alcohol use: No  . Drug use: No  . Sexual activity: Not on file  Lifestyle  . Physical activity    Days per week: Not on  file    Minutes per session: Not on file  . Stress: Not on file  Relationships  . Social Herbalist on phone: Not on file    Gets together: Not on file    Attends religious service: Not on file    Active member of club or organization: Not on file    Attends meetings of clubs or organizations: Not on file    Relationship status: Not on file  . Intimate partner violence    Fear of current or ex partner: Not on file    Emotionally abused: Not on file    Physically abused: Not on file    Forced sexual activity: Not on file  Other Topics Concern  . Not on file  Social History Narrative  . Not on file   Outpatient Encounter Medications as of 02/01/2019  Medication Sig Note  . lisinopril-hydrochlorothiazide (PRINZIDE,ZESTORETIC) 20-25 MG tablet TAKE 1 TABLET BY MOUTH EVERY DAY   . metoprolol succinate (TOPROL-XL) 50 MG 24 hr tablet TAKE 1 TABLET BY MOUTH EVERY DAY WITH OR IMMEDIATELY FOLLOWING A MEAL   . Misc  Natural Products (HORNY GOAT WEED PO) Take 100 mg by mouth.   . NON FORMULARY daily. Reishi Mushroom Supplement   . pregabalin (LYRICA) 50 MG capsule Take 50 mg by mouth 3 (three) times daily. 09/24/2016: PRN   No facility-administered encounter medications on file as of 02/01/2019.    No Known Allergies  Review of Systems  Constitutional: Negative.   Cardiovascular: Negative.     Objective:  BP 128/86 (BP Location: Right Arm, Patient Position: Sitting, Cuff Size: Large)   Pulse 74   Temp (!) 97.3 F (36.3 C) (Temporal)   Resp 16   Ht 5\' 8"  (1.727 m)   Wt 282 lb (127.9 kg)   SpO2 99%   BMI 42.88 kg/m   Physical Exam  Constitutional: He is oriented to person, place, and time and well-developed, well-nourished, and in no distress.  HENT:  Head: Normocephalic.  Eyes: Conjunctivae are normal.  Neck: Neck supple.  Cardiovascular: Normal rate.  Pulmonary/Chest: Effort normal and breath sounds normal.  Abdominal: Soft.  Musculoskeletal: Normal range of  motion.  Neurological: He is alert and oriented to person, place, and time.  Skin: No rash noted.  Psychiatric: Mood, affect and judgment normal.    Assessment and Plan :  1. Essential hypertension Stable BP on the Lisinopril-HCTZ 20-25 mg qd with Metoprolol Succinate 50 mg qd. No chest pains, dyspnea, palpitations, edema or respiratory symptoms. No known COVID exposure. Recheck CBC, CMP, Lipid Panel and TSH. Follow up pending reports. - CBC with Differential/Platelet - Comprehensive metabolic panel - Lipid panel - TSH  2. Obesity, Class II, BMI 35-39.9 Pandemic restrictions of activities has allowed him to eat more and be more sedentary. Has gained 20 lbs since March 2020. Encouraged to try to exercise 30-40 minutes 3-4 days a week and lower caloric intake. Recheck labs and follow up pending reports. - CBC with Differential/Platelet - Comprehensive metabolic panel - Lipid panel - TSH

## 2019-02-02 ENCOUNTER — Telehealth: Payer: Self-pay

## 2019-02-02 LAB — CBC WITH DIFFERENTIAL/PLATELET
Basophils Absolute: 0 10*3/uL (ref 0.0–0.2)
Basos: 1 %
EOS (ABSOLUTE): 0.2 10*3/uL (ref 0.0–0.4)
Eos: 4 %
Hematocrit: 43.8 % (ref 37.5–51.0)
Hemoglobin: 14.9 g/dL (ref 13.0–17.7)
Immature Grans (Abs): 0 10*3/uL (ref 0.0–0.1)
Immature Granulocytes: 0 %
Lymphocytes Absolute: 1.9 10*3/uL (ref 0.7–3.1)
Lymphs: 45 %
MCH: 28.6 pg (ref 26.6–33.0)
MCHC: 34 g/dL (ref 31.5–35.7)
MCV: 84 fL (ref 79–97)
Monocytes Absolute: 0.4 10*3/uL (ref 0.1–0.9)
Monocytes: 10 %
Neutrophils Absolute: 1.6 10*3/uL (ref 1.4–7.0)
Neutrophils: 40 %
Platelets: 376 10*3/uL (ref 150–450)
RBC: 5.21 x10E6/uL (ref 4.14–5.80)
RDW: 12.9 % (ref 11.6–15.4)
WBC: 4 10*3/uL (ref 3.4–10.8)

## 2019-02-02 LAB — COMPREHENSIVE METABOLIC PANEL
ALT: 27 IU/L (ref 0–44)
AST: 21 IU/L (ref 0–40)
Albumin/Globulin Ratio: 1.4 (ref 1.2–2.2)
Albumin: 4.2 g/dL (ref 4.0–5.0)
Alkaline Phosphatase: 58 IU/L (ref 39–117)
BUN/Creatinine Ratio: 10 (ref 9–20)
BUN: 13 mg/dL (ref 6–20)
Bilirubin Total: 0.3 mg/dL (ref 0.0–1.2)
CO2: 22 mmol/L (ref 20–29)
Calcium: 9.1 mg/dL (ref 8.7–10.2)
Chloride: 101 mmol/L (ref 96–106)
Creatinine, Ser: 1.28 mg/dL — ABNORMAL HIGH (ref 0.76–1.27)
GFR calc Af Amer: 82 mL/min/{1.73_m2} (ref >59)
GFR calc non Af Amer: 71 mL/min/{1.73_m2} (ref >59)
Globulin, Total: 3.1 g/dL (ref 1.5–4.5)
Glucose: 98 mg/dL (ref 65–99)
Potassium: 4.2 mmol/L (ref 3.5–5.2)
Sodium: 139 mmol/L (ref 134–144)
Total Protein: 7.3 g/dL (ref 6.0–8.5)

## 2019-02-02 LAB — LIPID PANEL
Chol/HDL Ratio: 4.4 ratio (ref 0.0–5.0)
Cholesterol, Total: 182 mg/dL (ref 100–199)
HDL: 41 mg/dL (ref >39)
LDL Chol Calc (NIH): 116 mg/dL — ABNORMAL HIGH (ref 0–99)
Triglycerides: 137 mg/dL (ref 0–149)
VLDL Cholesterol Cal: 25 mg/dL (ref 5–40)

## 2019-02-02 LAB — TSH: TSH: 1.02 u[IU]/mL (ref 0.450–4.500)

## 2019-02-02 NOTE — Telephone Encounter (Signed)
Patient advised as below. Patient verbalizes understanding and is in agreement with treatment plan.  

## 2019-02-02 NOTE — Telephone Encounter (Signed)
-----   Message from Margo Common, [place] sent at 02/02/2019  8:21 AM EDT ----- Blood tests essentially normal with creatinine slowly coming back to normal range. LDL cholesterol still above goal of <100 with a level of 116 this time. Continuing to work on weight loss should help all of this and blood pressure to come back in normal ranges. Exercise 30-40 minutes 4 days a week and continue present medications with low fat diet. Recheck progress in 6 months.

## 2019-07-03 ENCOUNTER — Other Ambulatory Visit: Payer: Self-pay | Admitting: Family Medicine

## 2019-07-03 DIAGNOSIS — I10 Essential (primary) hypertension: Secondary | ICD-10-CM

## 2019-07-03 NOTE — Telephone Encounter (Signed)
Requested Prescriptions  Pending Prescriptions Disp Refills  . metoprolol succinate (TOPROL-XL) 50 MG 24 hr tablet [Pharmacy Med Name: METOPROLOL SUCC ER 50 MG TAB] 90 tablet 3    Sig: TAKE 1 TABLET BY MOUTH EVERY DAY WITH OR IMMEDIATELY FOLLOWING A MEAL     Cardiovascular:  Beta Blockers Passed - 07/03/2019 10:24 AM      Passed - Last BP in normal range    BP Readings from Last 1 Encounters:  02/01/19 128/86         Passed - Last Heart Rate in normal range    Pulse Readings from Last 1 Encounters:  02/01/19 74         Passed - Valid encounter within last 6 months    Recent Outpatient Visits          5 months ago Essential hypertension   [facility], Jodell Cipro, [place]   11 months ago Essential hypertension   [facility], Jodell Cipro, [place]   1 year ago Annual physical exam   [facility], Jodell Cipro, [place]   2 years ago Essential hypertension   [facility], Jodell Cipro, [place]   2 years ago Uncontrolled hypertension   [facility], Jodell Cipro, [place]      Future Appointments            In 1 month Chrismon, Jodell Cipro, [place] [facility]

## 2019-07-03 NOTE — Telephone Encounter (Signed)
Requested Prescriptions  Pending Prescriptions Disp Refills  . metoprolol succinate (TOPROL-XL) 50 MG 24 hr tablet [Pharmacy Med Name: METOPROLOL SUCC ER 50 MG TAB] 90 tablet 0    Sig: TAKE 1 TABLET BY MOUTH EVERY DAY WITH OR IMMEDIATELY FOLLOWING A MEAL     Cardiovascular:  Beta Blockers Passed - 07/03/2019 10:24 AM      Passed - Last BP in normal range    BP Readings from Last 1 Encounters:  02/01/19 128/86         Passed - Last Heart Rate in normal range    Pulse Readings from Last 1 Encounters:  02/01/19 74         Passed - Valid encounter within last 6 months    Recent Outpatient Visits          5 months ago Essential hypertension   [facility], Jodell Cipro, [place]   11 months ago Essential hypertension   [facility], Jodell Cipro, [place]   1 year ago Annual physical exam   [facility], Jodell Cipro, [place]   2 years ago Essential hypertension   [facility], Jodell Cipro, [place]   2 years ago Uncontrolled hypertension   [facility], Jodell Cipro, [place]      Future Appointments            In 1 month Chrismon, Jodell Cipro, [place] [facility]

## 2019-07-30 NOTE — Progress Notes (Signed)
Established patient visit I,Elena D DeSanto,acting as a scribe for [employer], PA.,have documented all relevant documentation on the behalf of Dortha Kern, PA,as directed by  [employer], PA while in the presence of [employer], [place].       Patient: Alejandro Santos   DOB: [birth year]   39 y.o. Male  MRN: [id number] Visit Date: 08/02/2019  Today's healthcare provider: Dortha Kern, PA  Subjective:    Chief Complaint  Patient presents with  . Hypertension   HPI  Hypertension, follow-up:  BP Readings from Last 3 Encounters:  08/02/19 (!) 162/100  02/01/19 128/86  07/13/18 110/84    He was last seen for hypertension 8 months ago. Management since that visit includes none. He reports good compliance with treatment. He is not having side effects.   Outside blood pressures are being checked but he does not have a recording of them and can't remember what they are running, other than to say they have been normal..   Weight trend: stable Wt Readings from Last 3 Encounters:  08/02/19 285 lb (129.3 kg)  02/01/19 282 lb (127.9 kg)  07/13/18 262 lb (118.8 kg)   Patient Active Problem List   Diagnosis Date Noted  . Essential hypertension 10/07/2016    Social History   Tobacco Use  . Smoking status: Never Smoker  . Smokeless tobacco: Never Used  Substance Use Topics  . Alcohol use: No  . Drug use: No   Family Status  Relation Name Status  . Mother  Deceased  . Father  Alive  . Sister  Alive  . Brother  Alive  . Neg Hx  (Not Specified)       Medications: Outpatient Medications Prior to Visit  Medication Sig  . lisinopril-hydrochlorothiazide (PRINZIDE,ZESTORETIC) 20-25 MG tablet TAKE 1 TABLET BY MOUTH EVERY DAY  . metoprolol succinate (TOPROL-XL) 50 MG 24 hr tablet TAKE 1 TABLET BY MOUTH EVERY DAY WITH OR IMMEDIATELY FOLLOWING A MEAL  . Misc Natural Products (HORNY GOAT WEED PO) Take 100 mg by mouth.  . pregabalin (LYRICA) 50 MG capsule Take  50 mg by mouth 3 (three) times daily.  . NON FORMULARY daily. Reishi Mushroom Supplement   No facility-administered medications prior to visit.    Review of Systems  Constitutional: Negative for chills, fatigue and fever.  HENT: Negative for congestion, sinus pain and sore throat.   Respiratory: Negative for cough, chest tightness and shortness of breath.   Cardiovascular: Negative for chest pain and leg swelling.  Gastrointestinal: Negative for abdominal pain.  Musculoskeletal: Negative for arthralgias.        Objective:    BP (!) 162/100 (BP Location: Right Arm, Patient Position: Sitting, Cuff Size: Large)   Pulse 61   Temp (!) 96.8 F (36 C) (Skin)   Wt 285 lb (129.3 kg)   SpO2 98%   BMI 43.33 kg/m    Vitals:   08/02/19 0812 08/02/19 0815  BP: (!) 178/100 (!) 162/100  Pulse: 61   Temp: (!) 96.8 F (36 C)   TempSrc: Skin   SpO2: 98%   Weight: 285 lb (129.3 kg)       Physical Exam Constitutional:      General: He is not in acute distress.    Appearance: He is well-developed.  HENT:     Head: Normocephalic and atraumatic.     Right Ear: Hearing normal.     Left Ear: Hearing normal.     Nose: Nose normal.  Eyes:  General: Lids are normal. No scleral icterus.       Right eye: No discharge.        Left eye: No discharge.     Conjunctiva/sclera: Conjunctivae normal.  Cardiovascular:     Rate and Rhythm: Normal rate and regular rhythm.  Pulmonary:     Effort: Pulmonary effort is normal. No respiratory distress.     Breath sounds: Normal breath sounds.  Abdominal:     General: Bowel sounds are normal.     Palpations: Abdomen is soft.  Musculoskeletal:        General: Normal range of motion.     Cervical back: Normal range of motion.  Skin:    Findings: No lesion or rash.  Neurological:     Mental Status: He is alert and oriented to person, place, and time.  Psychiatric:        Speech: Speech normal.        Behavior: Behavior normal.        Thought  Content: Thought content normal.       Assessment & Plan:    1. Essential hypertension BP very high today. States he has been taking the Metoprolol Succinate 50 mg qd with Lisinopril-HCTZ 20-25 mg qd regularly. Feels the elevation of BP was due to an argument with his wife this morning. BP usually in the 120-130/80-89 at home. Continue to limit salt intake, must lose weight and restrict caffeine and ETOH. Recheck labs and follow up pending reports. - lisinopril-hydrochlorothiazide (ZESTORETIC) 20-25 MG tablet; Take 1 tablet by mouth daily.  Dispense: 90 tablet; Refill: 3 - CBC with Differential/Platelet - Comprehensive metabolic panel - Lipid panel - TSH  2. Obesity, Class II, BMI 35-39.9 BMI up to 43 today. Counseled regarding exercise and diet restrictions to get weight down. Recheck labs. - CBC with Differential/Platelet - Comprehensive metabolic panel - Lipid panel - TSH      Vernie Murders, [place] [phone] (phone) [phone] (fax)  [place]

## 2019-08-02 ENCOUNTER — Other Ambulatory Visit: Payer: Self-pay

## 2019-08-02 ENCOUNTER — Encounter: Payer: Self-pay | Admitting: Family Medicine

## 2019-08-02 ENCOUNTER — Ambulatory Visit (INDEPENDENT_AMBULATORY_CARE_PROVIDER_SITE_OTHER): Payer: Commercial Managed Care - PPO | Admitting: Family Medicine

## 2019-08-02 VITALS — BP 162/100 | HR 61 | Temp 96.8°F | Wt 285.0 lb

## 2019-08-02 DIAGNOSIS — I10 Essential (primary) hypertension: Secondary | ICD-10-CM | POA: Diagnosis not present

## 2019-08-02 DIAGNOSIS — E669 Obesity, unspecified: Secondary | ICD-10-CM

## 2019-08-02 MED ORDER — LISINOPRIL-HYDROCHLOROTHIAZIDE 20-25 MG PO TABS: 1.0000 | ORAL_TABLET | Freq: Every day | ORAL | 3 refills | Status: DC

## 2019-08-03 LAB — COMPREHENSIVE METABOLIC PANEL
ALT: 21 IU/L (ref 0–44)
AST: 21 IU/L (ref 0–40)
Albumin/Globulin Ratio: 1.4 (ref 1.2–2.2)
Albumin: 4.4 g/dL (ref 4.0–5.0)
Alkaline Phosphatase: 72 IU/L (ref 39–117)
BUN/Creatinine Ratio: 8 — ABNORMAL LOW (ref 9–20)
BUN: 10 mg/dL (ref 6–20)
Bilirubin Total: 0.4 mg/dL (ref 0.0–1.2)
CO2: 20 mmol/L (ref 20–29)
Calcium: 9.1 mg/dL (ref 8.7–10.2)
Chloride: 102 mmol/L (ref 96–106)
Creatinine, Ser: 1.23 mg/dL (ref 0.76–1.27)
GFR calc Af Amer: 86 mL/min/{1.73_m2} (ref >59)
GFR calc non Af Amer: 74 mL/min/{1.73_m2} (ref >59)
Globulin, Total: 3.1 g/dL (ref 1.5–4.5)
Glucose: 98 mg/dL (ref 65–99)
Potassium: 4.3 mmol/L (ref 3.5–5.2)
Sodium: 141 mmol/L (ref 134–144)
Total Protein: 7.5 g/dL (ref 6.0–8.5)

## 2019-08-03 LAB — CBC WITH DIFFERENTIAL/PLATELET
Basophils Absolute: 0 10*3/uL (ref 0.0–0.2)
Basos: 1 %
EOS (ABSOLUTE): 0.1 10*3/uL (ref 0.0–0.4)
Eos: 2 %
Hematocrit: 46.7 % (ref 37.5–51.0)
Hemoglobin: 16.3 g/dL (ref 13.0–17.7)
Immature Grans (Abs): 0 10*3/uL (ref 0.0–0.1)
Immature Granulocytes: 0 %
Lymphocytes Absolute: 2.3 10*3/uL (ref 0.7–3.1)
Lymphs: 46 %
MCH: 29.4 pg (ref 26.6–33.0)
MCHC: 34.9 g/dL (ref 31.5–35.7)
MCV: 84 fL (ref 79–97)
Monocytes Absolute: 0.5 10*3/uL (ref 0.1–0.9)
Monocytes: 9 %
Neutrophils Absolute: 2.1 10*3/uL (ref 1.4–7.0)
Neutrophils: 42 %
Platelets: 377 10*3/uL (ref 150–450)
RBC: 5.54 x10E6/uL (ref 4.14–5.80)
RDW: 13.3 % (ref 11.6–15.4)
WBC: 4.9 10*3/uL (ref 3.4–10.8)

## 2019-08-03 LAB — TSH: TSH: 1.43 u[IU]/mL (ref 0.450–4.500)

## 2019-08-03 LAB — LIPID PANEL
Chol/HDL Ratio: 4.5 ratio (ref 0.0–5.0)
Cholesterol, Total: 186 mg/dL (ref 100–199)
HDL: 41 mg/dL (ref >39)
LDL Chol Calc (NIH): 117 mg/dL — ABNORMAL HIGH (ref 0–99)
Triglycerides: 160 mg/dL — ABNORMAL HIGH (ref 0–149)
VLDL Cholesterol Cal: 28 mg/dL (ref 5–40)

## 2019-11-15 ENCOUNTER — Ambulatory Visit: Payer: Self-pay | Admitting: Family Medicine

## 2020-01-03 ENCOUNTER — Ambulatory Visit: Payer: Self-pay | Admitting: Family Medicine

## 2020-02-01 ENCOUNTER — Ambulatory Visit: Payer: Self-pay | Admitting: Family Medicine

## 2020-02-01 NOTE — Progress Notes (Deleted)
° ° ° °  Established patient visit   Patient: Alejandro Santos   DOB: 1981-03-29   39 y.o. Male  MRN: [id number] Visit Date: 02/01/2020  Today's healthcare provider: Dortha Kern, PA   No chief complaint on file.  Subjective    HPI  Lipid/Cholesterol, Follow-up  Last lipid panel Other pertinent labs  Lab Results  Component Value Date   CHOL 186 08/02/2019   HDL 41 08/02/2019   LDLCALC 117 (H) 08/02/2019   TRIG 160 (H) 08/02/2019   CHOLHDL 4.5 08/02/2019   Lab Results  Component Value Date   ALT 21 08/02/2019   AST 21 08/02/2019   PLT 377 08/02/2019   TSH 1.430 08/02/2019     He was last seen for this {1-12:18279} {days/wks/mos/yrs:310907} ago.  Management since that visit includes ***.  He reports {excellent/good/fair/poor:19665} compliance with treatment. He {is/is not:9024} having side effects. {document side effects if present:1}  Symptoms: {Yes/No:20286} chest pain {Yes/No:20286} chest pressure/discomfort  {Yes/No:20286} dyspnea {Yes/No:20286} lower extremity edema  {Yes/No:20286} numbness or tingling of extremity {Yes/No:20286} orthopnea  {Yes/No:20286} palpitations {Yes/No:20286} paroxysmal nocturnal dyspnea  {Yes/No:20286} speech difficulty {Yes/No:20286} syncope   Current diet: {diet habits:16563} Current exercise: {exercise types:16438}  The ASCVD Risk score Denman George DC Jr., et al., 2013) failed to calculate for the following reasons:   The 2013 ASCVD risk score is only valid for ages 96 to 80  --------------------------------------------------------------------------------------------------- Patient also here to follow up on weight management.   {Show patient history (optional):23778::" "}   Medications: Outpatient Medications Prior to Visit  Medication Sig   lisinopril-hydrochlorothiazide (ZESTORETIC) 20-25 MG tablet Take 1 tablet by mouth daily.   metoprolol succinate (TOPROL-XL) 50 MG 24 hr tablet TAKE 1 TABLET BY MOUTH EVERY DAY WITH OR  IMMEDIATELY FOLLOWING A MEAL   Misc Natural Products (HORNY GOAT WEED PO) Take 100 mg by mouth.   NON FORMULARY daily. Reishi Mushroom Supplement   pregabalin (LYRICA) 50 MG capsule Take 50 mg by mouth 3 (three) times daily.   No facility-administered medications prior to visit.    Review of Systems  {Heme   Chem   Endocrine   Serology   Results Review (optional):23779::" "}  Objective    There were no vitals taken for this visit. {Show previous vital signs (optional):23777::" "}  Physical Exam  ***  No results found for any visits on 02/01/20.  Assessment & Plan     ***  No follow-ups on file.      {provider attestation***:1}   Dortha Kern, PA  [facility] [phone] (phone) [phone] (fax)  [facility]

## 2021-06-05 ENCOUNTER — Other Ambulatory Visit: Payer: Self-pay

## 2021-06-05 ENCOUNTER — Emergency Department (HOSPITAL_BASED_OUTPATIENT_CLINIC_OR_DEPARTMENT_OTHER): Payer: 59 | Admitting: Radiology

## 2021-06-05 ENCOUNTER — Observation Stay (HOSPITAL_BASED_OUTPATIENT_CLINIC_OR_DEPARTMENT_OTHER)
Admission: EM | Admit: 2021-06-05 | Discharge: 2021-06-08 | Disposition: A | Discharge status: Home / Self Care | Payer: 59 | Attending: Internal Medicine | Admitting: Internal Medicine

## 2021-06-05 ENCOUNTER — Encounter (HOSPITAL_BASED_OUTPATIENT_CLINIC_OR_DEPARTMENT_OTHER): Payer: Self-pay | Admitting: Emergency Medicine

## 2021-06-05 ENCOUNTER — Emergency Department (HOSPITAL_BASED_OUTPATIENT_CLINIC_OR_DEPARTMENT_OTHER): Payer: 59

## 2021-06-05 DIAGNOSIS — I129 Hypertensive chronic kidney disease with stage 1 through stage 4 chronic kidney disease, or unspecified chronic kidney disease: Secondary | ICD-10-CM | POA: Diagnosis not present

## 2021-06-05 DIAGNOSIS — N179 Acute kidney failure, unspecified: Secondary | ICD-10-CM | POA: Diagnosis not present

## 2021-06-05 DIAGNOSIS — I161 Hypertensive emergency: Secondary | ICD-10-CM | POA: Diagnosis not present

## 2021-06-05 DIAGNOSIS — I441 Atrioventricular block, second degree: Secondary | ICD-10-CM | POA: Insufficient documentation

## 2021-06-05 DIAGNOSIS — N1831 Chronic kidney disease, stage 3a: Secondary | ICD-10-CM | POA: Insufficient documentation

## 2021-06-05 DIAGNOSIS — R200 Anesthesia of skin: Secondary | ICD-10-CM | POA: Diagnosis not present

## 2021-06-05 DIAGNOSIS — Z20822 Contact with and (suspected) exposure to covid-19: Secondary | ICD-10-CM | POA: Insufficient documentation

## 2021-06-05 DIAGNOSIS — Z79899 Other long term (current) drug therapy: Secondary | ICD-10-CM | POA: Insufficient documentation

## 2021-06-05 DIAGNOSIS — Z6841 Body Mass Index (BMI) 40.0 and over, adult: Secondary | ICD-10-CM | POA: Insufficient documentation

## 2021-06-05 DIAGNOSIS — Z9114 Patient's other noncompliance with medication regimen: Secondary | ICD-10-CM | POA: Insufficient documentation

## 2021-06-05 DIAGNOSIS — I10 Essential (primary) hypertension: Secondary | ICD-10-CM

## 2021-06-05 DIAGNOSIS — E785 Hyperlipidemia, unspecified: Secondary | ICD-10-CM | POA: Insufficient documentation

## 2021-06-05 DIAGNOSIS — R0789 Other chest pain: Secondary | ICD-10-CM | POA: Diagnosis present

## 2021-06-05 DIAGNOSIS — R079 Chest pain, unspecified: Secondary | ICD-10-CM

## 2021-06-05 HISTORY — DX: Essential (primary) hypertension: I10

## 2021-06-05 HISTORY — DX: Other specified postprocedural states: Z98.890

## 2021-06-05 HISTORY — DX: Hypertensive emergency: I16.1

## 2021-06-05 LAB — CBC
HCT: 46.3 % (ref 39.0–52.0)
Hemoglobin: 15.5 g/dL (ref 13.0–17.0)
MCH: 28.8 pg (ref 26.0–34.0)
MCHC: 33.5 g/dL (ref 30.0–36.0)
MCV: 86.1 fL (ref 80.0–100.0)
Platelets: 409 10*3/uL — ABNORMAL HIGH (ref 150–400)
RBC: 5.38 MIL/uL (ref 4.22–5.81)
RDW: 13.4 % (ref 11.5–15.5)
WBC: 9.6 10*3/uL (ref 4.0–10.5)
nRBC: 0 % (ref 0.0–0.2)

## 2021-06-05 LAB — BASIC METABOLIC PANEL
Anion gap: 12 (ref 5–15)
BUN: 16 mg/dL (ref 6–20)
CO2: 27 mmol/L (ref 22–32)
Calcium: 9.5 mg/dL (ref 8.9–10.3)
Chloride: 97 mmol/L — ABNORMAL LOW (ref 98–111)
Creatinine, Ser: 1.52 mg/dL — ABNORMAL HIGH (ref 0.61–1.24)
GFR, Estimated: 59 mL/min — ABNORMAL LOW (ref >60)
Glucose, Bld: 142 mg/dL — ABNORMAL HIGH (ref 70–99)
Potassium: 3.7 mmol/L (ref 3.5–5.1)
Sodium: 136 mmol/L (ref 135–145)

## 2021-06-05 LAB — TROPONIN I (HIGH SENSITIVITY)
Troponin I (High Sensitivity): 9 ng/L (ref <18)
Troponin I (High Sensitivity): 11 ng/L (ref <18)

## 2021-06-05 LAB — SEDIMENTATION RATE: Sed Rate: 12 mm/hr (ref 0–16)

## 2021-06-05 LAB — C-REACTIVE PROTEIN: CRP: 0.7 mg/dL (ref <1.0)

## 2021-06-05 LAB — MAGNESIUM: Magnesium: 1.9 mg/dL (ref 1.7–2.4)

## 2021-06-05 IMAGING — CT CT ANGIO CHEST-ABD-PELV FOR DISSECTION W/ AND WO/W CM
2 of 7 series · 11 of 36 positions shown, 15 images · IV contrast (APPLIED)
Comparison: CT stone study [DATE]

CLINICAL DATA: Acute aortic syndrome. Chest pain and vomiting
blood.

EXAM:
CT ANGIOGRAPHY CHEST, ABDOMEN AND PELVIS
TECHNIQUE: Non-contrast CT of the chest was initially obtained.

[Series 5: axial arterial · axial · arterial · 0.89mm/px · z∈[+268,+850]mm · 10 of 333 slices shown, 13 images]
[im 21/333  mediastinal]
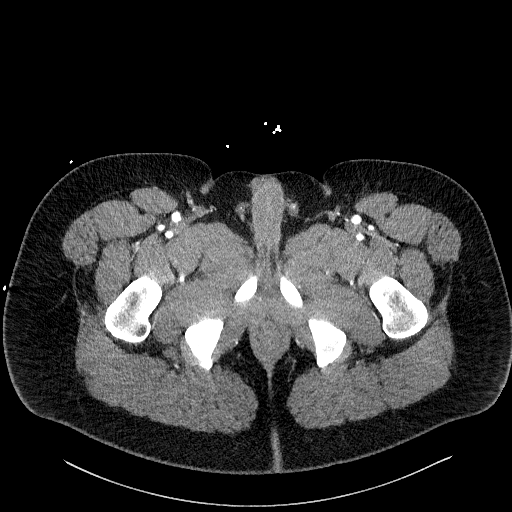
[im 21/333  bone]
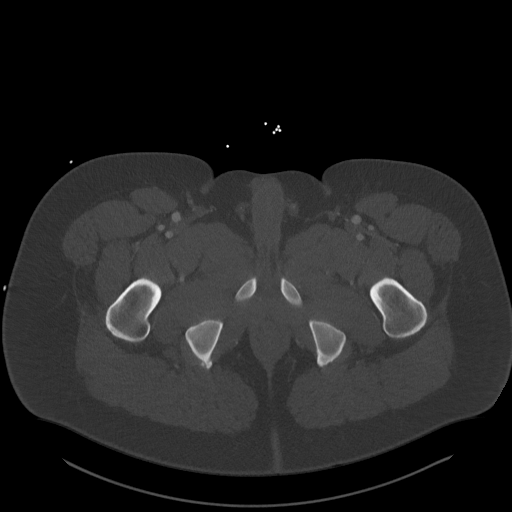
[im 63/333  mediastinal]
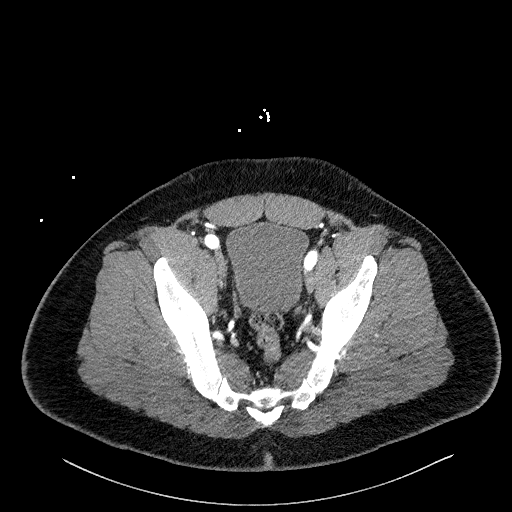
[im 104/333  mediastinal]
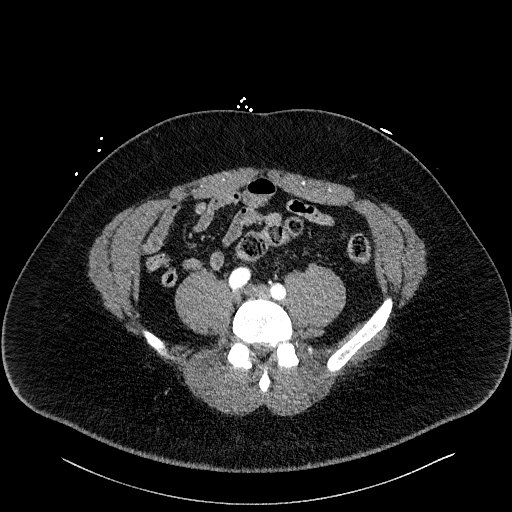
[im 146/333  mediastinal]
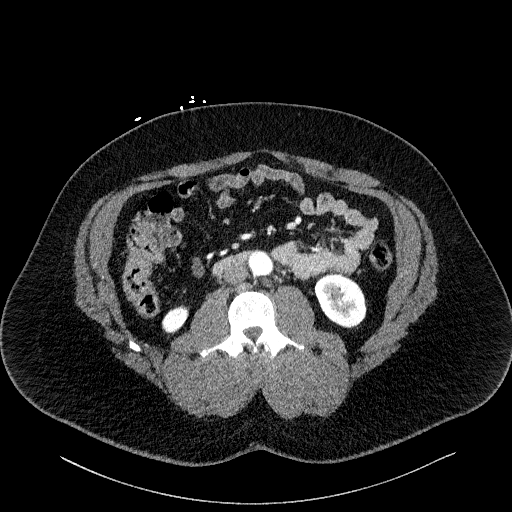
[im 187/333  mediastinal]
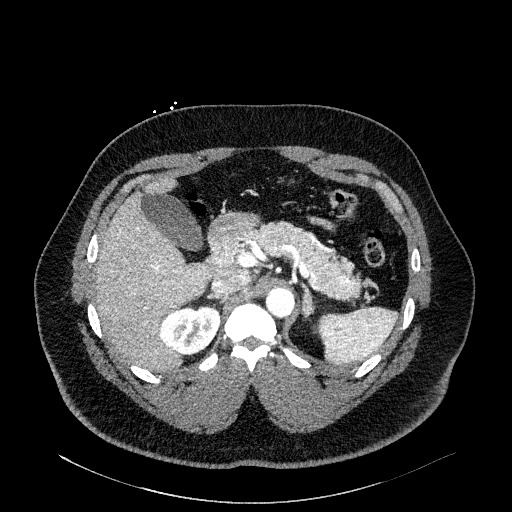
[im 229/333  mediastinal]
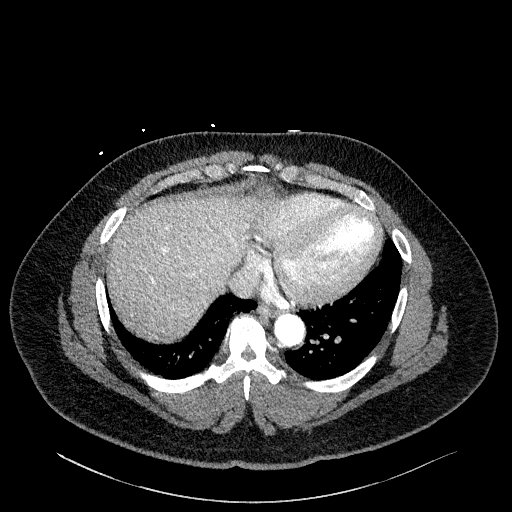
[im 250/333  lung]
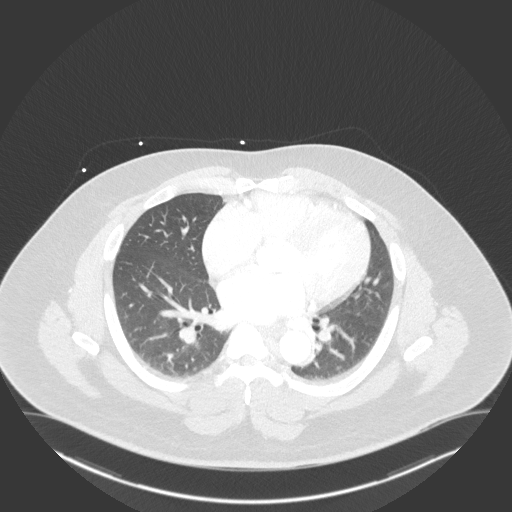
[im 270/333  mediastinal]
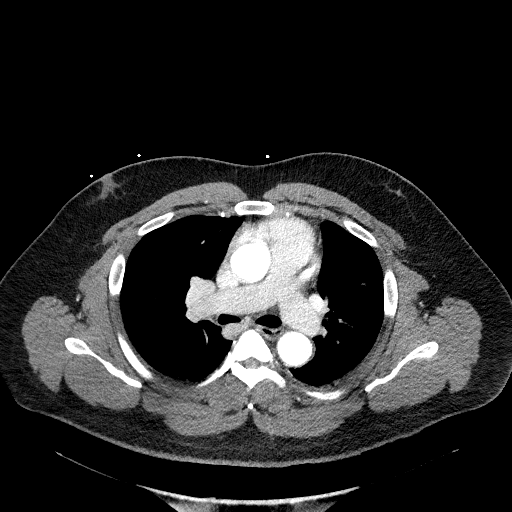
[im 270/333  lung]
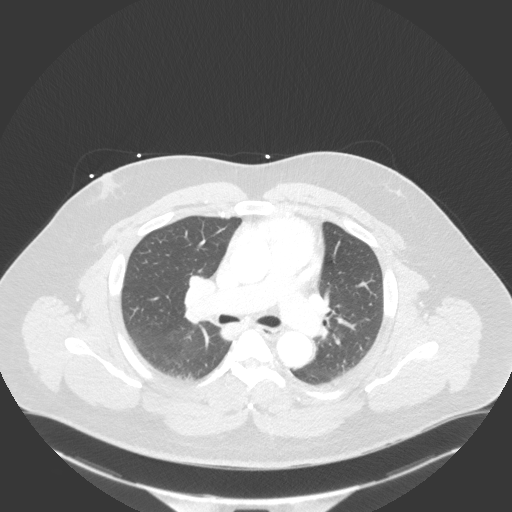
[im 291/333  lung]
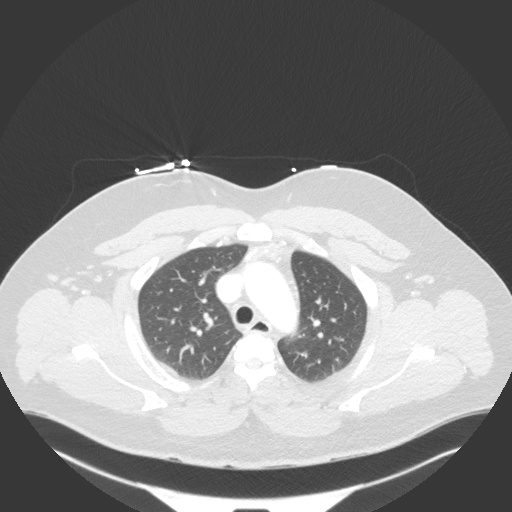
[im 312/333  mediastinal]
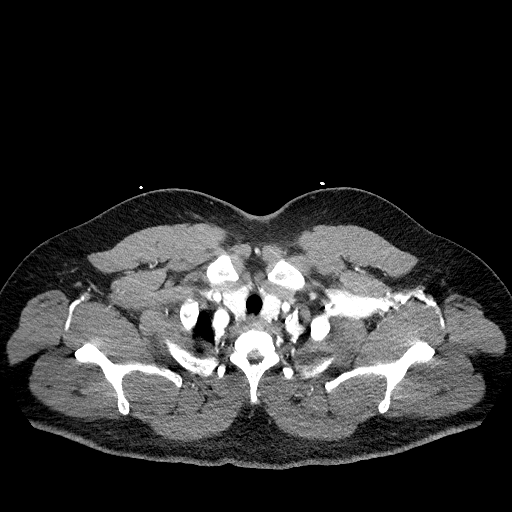
[im 312/333  lung]
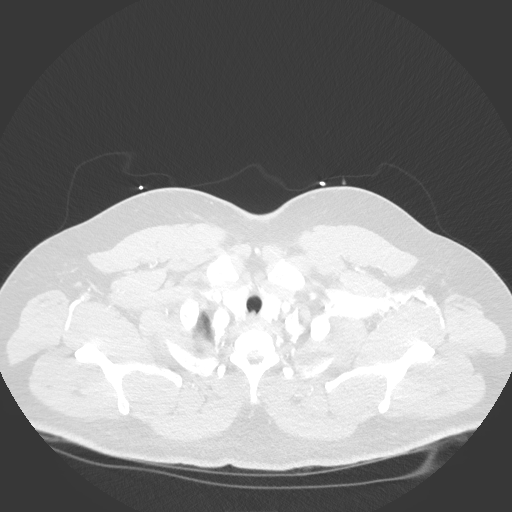

[Series 8: cor soft · coronal · 0.94mm/px · 1 of 177 slices shown, 2 images]
[im 89/177  mediastinal]
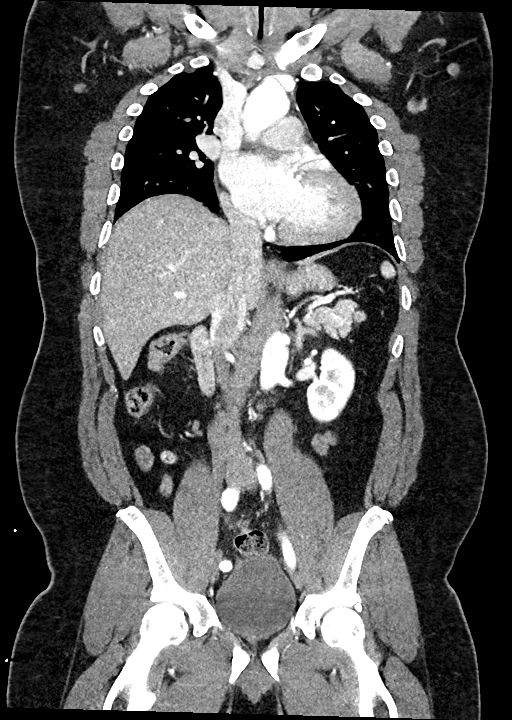
[im 89/177  bone]
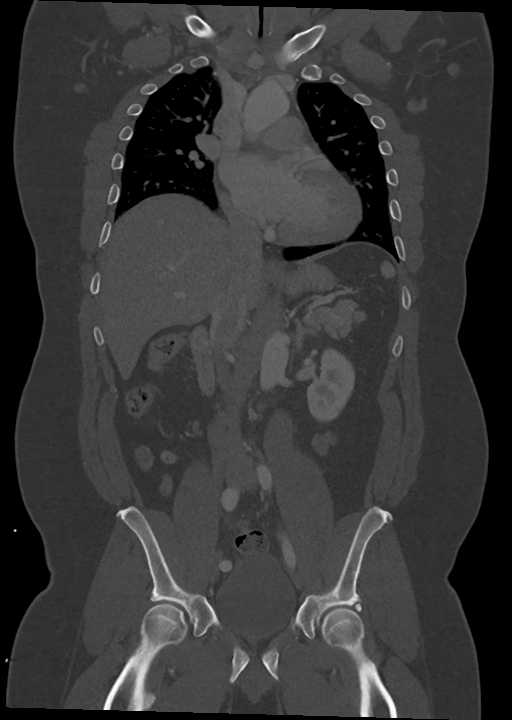

[11 of 36 positions shown; findings below may reference images not displayed]

Multidetector CT imaging through the chest, abdomen and pelvis was
performed using the standard protocol during bolus administration of
intravenous contrast. Multiplanar reconstructed images and MIPs were
obtained and reviewed to evaluate the vascular anatomy.

RADIATION DOSE REDUCTION: This exam was performed according to the
departmental dose-optimization program which includes automated
exposure control, adjustment of the mA and/or kV according to
patient size and/or use of iterative reconstruction technique.

CONTRAST:  125mL OMNIPAQUE IOHEXOL 350 MG/ML SOLN
FINDINGS: CTA CHEST FINDINGS

Cardiovascular: Pre contrast imaging shows no hyperdense crescent in
the wall of the thoracic aorta to suggest acute intramural hematoma.
Imaging after IV contrast administration shows no thoracic aortic
aneurysm with ascending thoracic aorta measuring approximately
cm diameter. No dissection of the thoracic aorta. Bovine arch vessel
anatomy evident, normal variant. Arch vessel anatomy is widely
patent. Heart size upper normal. No pericardial effusion.

Mediastinum/Nodes: No mediastinal lymphadenopathy. There is no hilar
lymphadenopathy. The esophagus has normal imaging features. There is
no axillary lymphadenopathy.

Lungs/Pleura: No suspicious pulmonary nodule or mass. No focal
airspace consolidation. There is no evidence of pleural effusion.

Musculoskeletal: No worrisome lytic or sclerotic osseous
abnormality.

Review of the MIP images confirms the above findings.

CTA ABDOMEN AND PELVIS FINDINGS

VASCULAR

Aorta: Normal caliber aorta without aneurysm, dissection, vasculitis
or significant stenosis.

Celiac: Patent without evidence of aneurysm, dissection, vasculitis
or significant stenosis.

SMA: Patent without evidence of aneurysm, dissection, vasculitis or
significant stenosis.

Renals: Both renal arteries are patent without evidence of aneurysm,
dissection, vasculitis, fibromuscular dysplasia or significant
stenosis.

IMA: Patent without evidence of aneurysm, dissection, vasculitis or
significant stenosis.

Inflow: Patent without evidence of aneurysm, dissection, vasculitis
or significant stenosis.

Veins: No obvious venous abnormality within the limitations of this
arterial phase study.

Review of the MIP images confirms the above findings.

NON-VASCULAR

Hepatobiliary: No suspicious focal abnormality within the liver
parenchyma. There is no evidence for gallstones, gallbladder wall
thickening, or pericholecystic fluid. No intrahepatic or
extrahepatic biliary dilation.

Pancreas: No focal mass lesion. No dilatation of the main duct. No
intraparenchymal cyst. No peripancreatic edema.

Spleen: No splenomegaly. No focal mass lesion.

Adrenals/Urinary Tract: No adrenal nodule or mass. Kidneys
unremarkable. No evidence for hydroureter. The urinary bladder
appears normal for the degree of distention.

Stomach/Bowel: Stomach is unremarkable. No gastric wall thickening.
No evidence of outlet obstruction. Duodenum is normally positioned
as is the ligament of Treitz. No small bowel wall thickening. No
small bowel dilatation. The terminal ileum is normal. Mild
distension noted mid appendix with no periappendiceal edema or
inflammation. No gross colonic mass. No colonic wall thickening.

Lymphatic: There is no gastrohepatic or hepatoduodenal ligament
lymphadenopathy. No retroperitoneal or mesenteric lymphadenopathy.
No pelvic sidewall lymphadenopathy.

Reproductive: The prostate gland and seminal vesicles are
unremarkable.

Other: No intraperitoneal free fluid.

Musculoskeletal: No worrisome lytic or sclerotic osseous
abnormality.

Review of the MIP images confirms the above findings.
IMPRESSION: 1. No thoracoabdominal aortic aneurysm or dissection. No evidence
for acute intramural hematoma of the thoracic aorta.
2. No acute findings in the chest, abdomen, or pelvis to explain the
patient's history of chest pain and vomiting.

## 2021-06-05 IMAGING — DX DG CHEST 2V
2 series · 2 of 2 positions shown · non-contrast
Comparison: Chest x-ray [DATE]

CLINICAL DATA: Chest pain

EXAM:
CHEST - 2 VIEW

[chest pa]
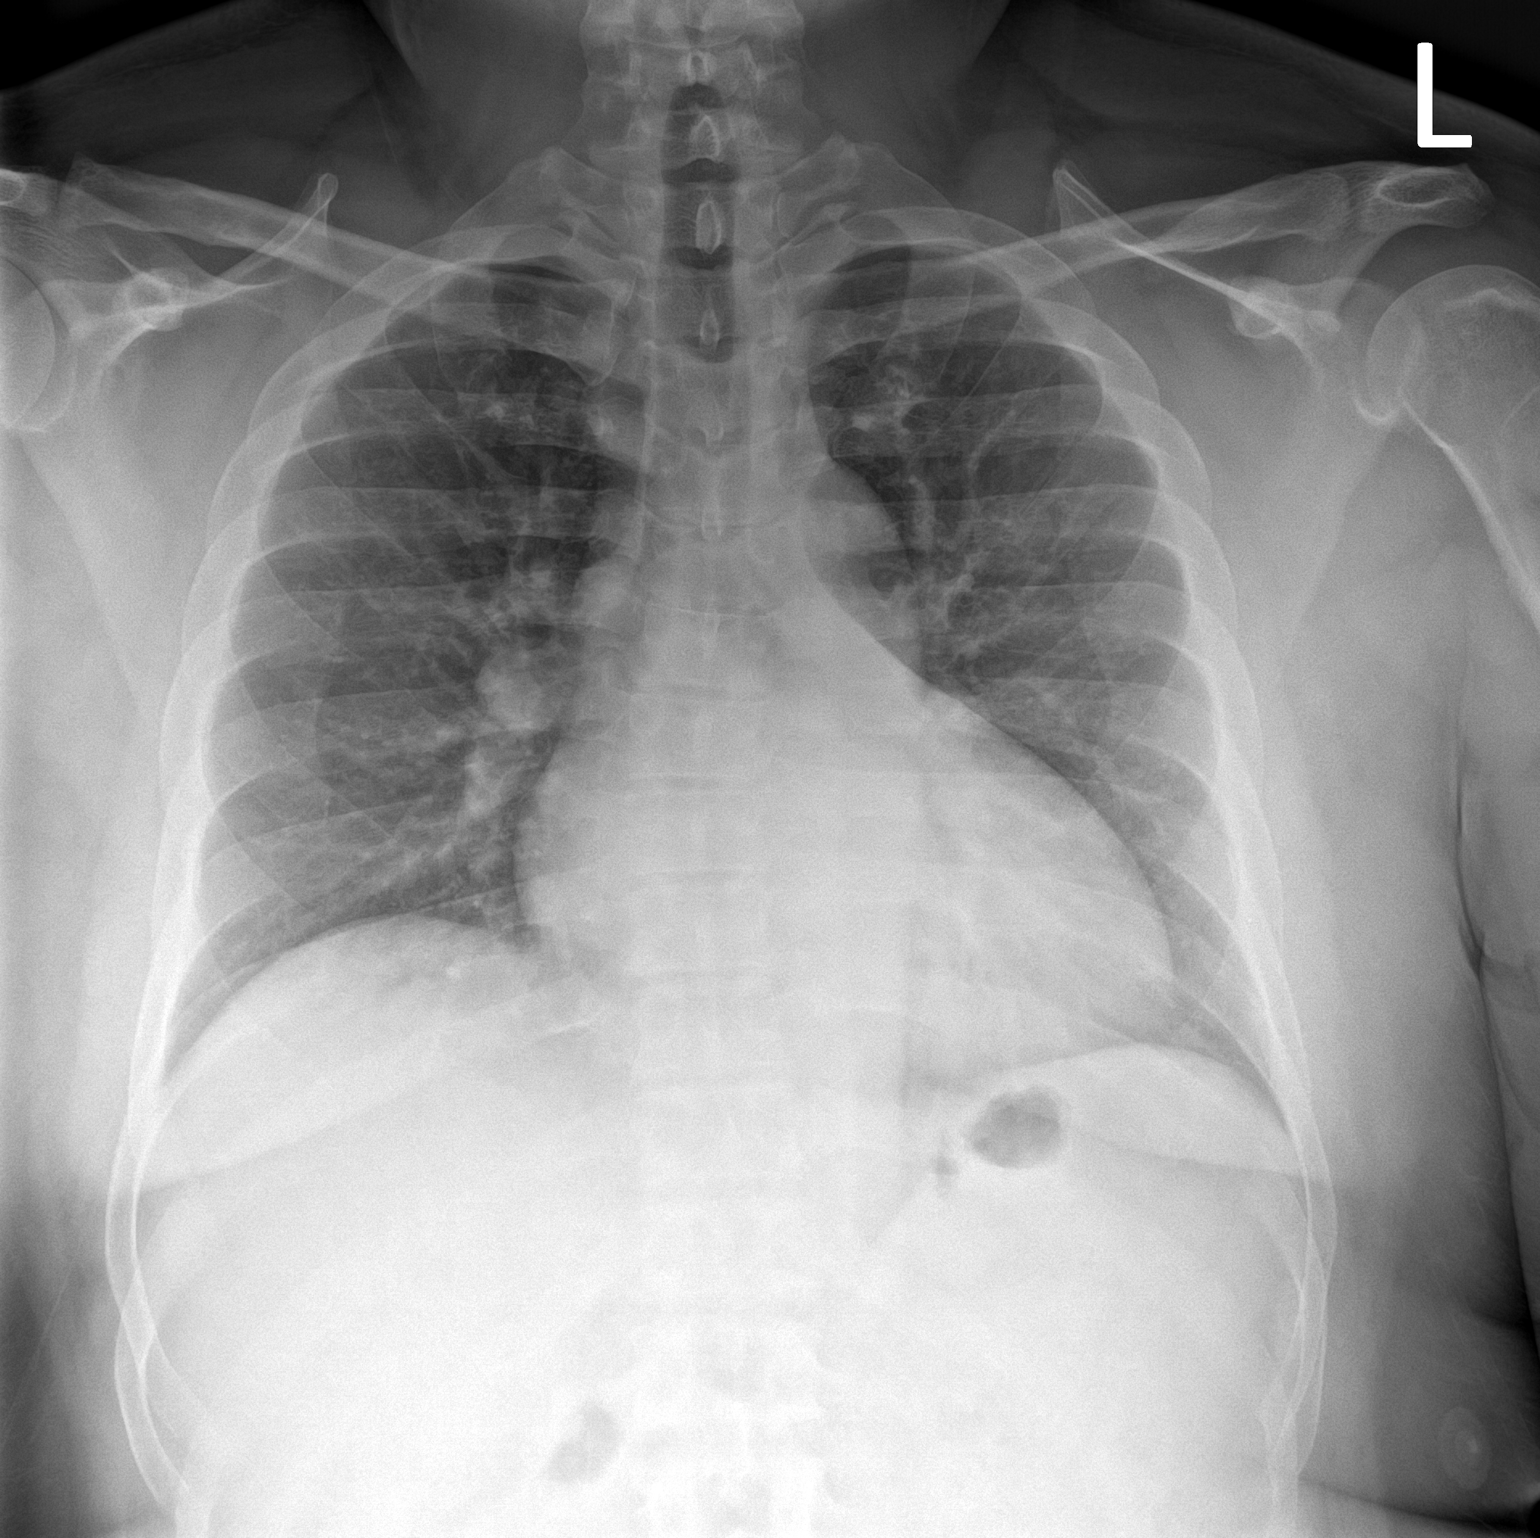

[chest lat]
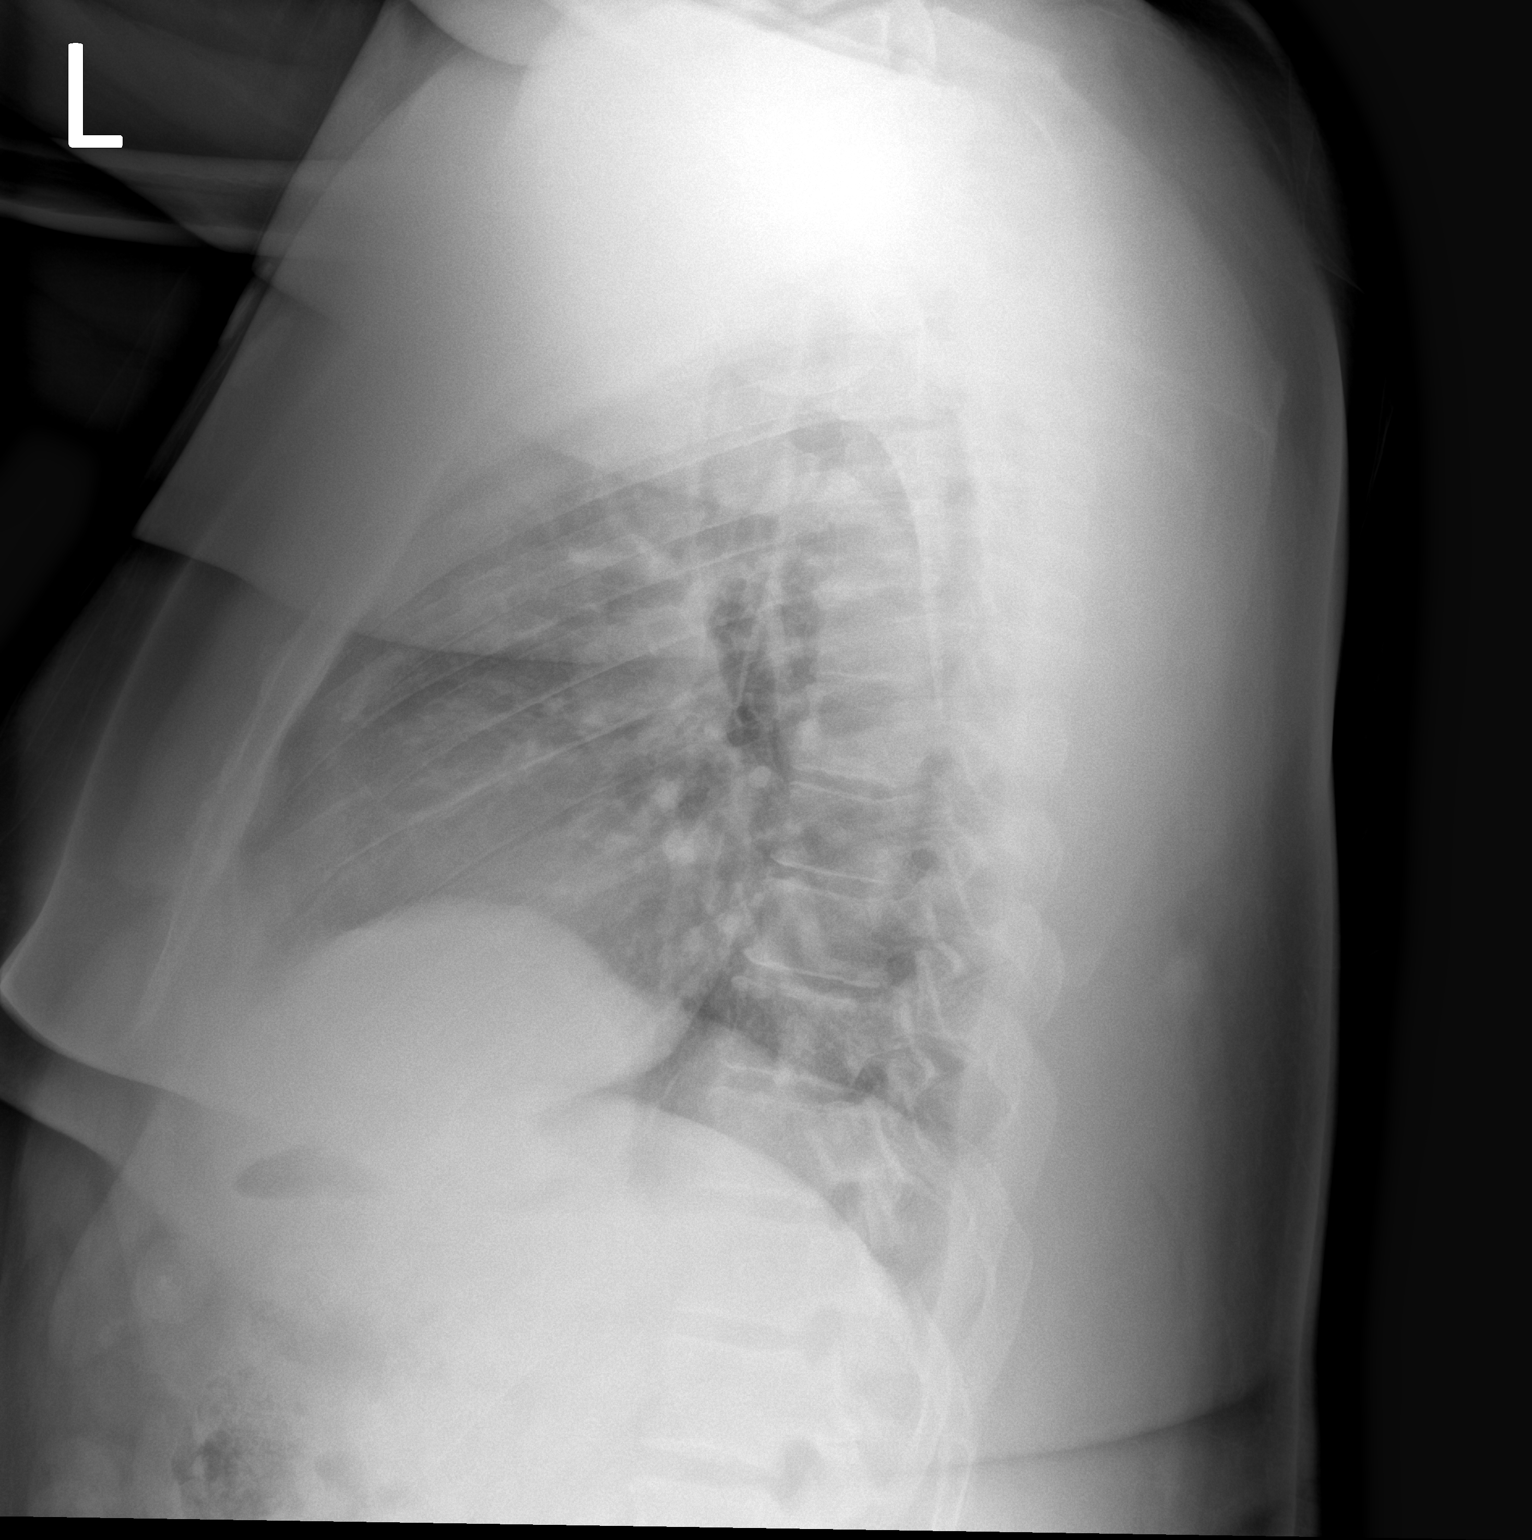

[2 of 2 positions shown; findings below may reference images not displayed]

FINDINGS: Heart size upper normal. Mediastinal contours are within normal
limits. No suspicious pulmonary opacities identified.

No pleural effusion or pneumothorax visualized.

No acute osseous abnormality appreciated.
IMPRESSION: No acute intrathoracic process identified.

## 2021-06-05 IMAGING — CT CT ANGIO HEAD-NECK (W OR W/O PERF)
3 of 11 series · 8 of 34 positions shown · non-contrast
Comparison: CT head [DATE]

CLINICAL DATA: Chest pain, hematemesis, neuro deficit

EXAM:
CT ANGIOGRAPHY HEAD AND NECK
TECHNIQUE: Multidetector CT imaging of the head and neck was performed using
the standard protocol during bolus administration of intravenous
contrast. Multiplanar CT image reconstructions and MIPs were
obtained to evaluate the vascular anatomy. Carotid stenosis
measurements (when applicable) are obtained utilizing NASCET
criteria, using the distal internal carotid diameter as the
denominator.

[Series 11: sagittal soft · sagittal · 0.31mm/px · 1 of 60 slices shown]
[im 39/60  soft-tissue]
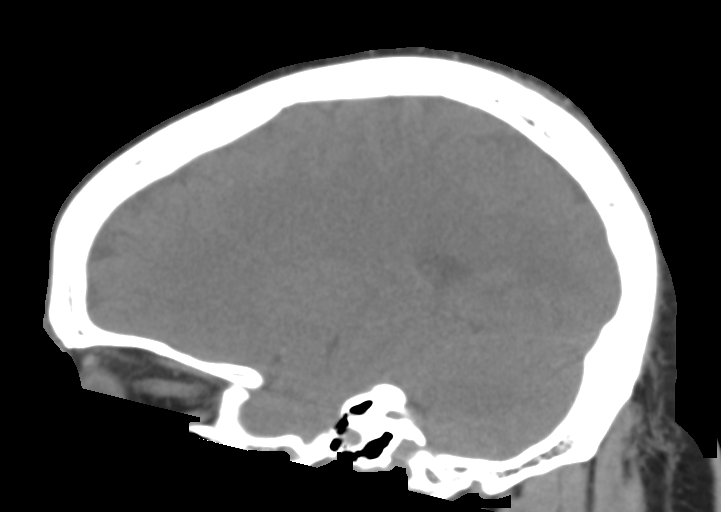

[Series 12: cta head · axial · 0.50mm/px · z∈[+891,+1009]mm · 2 of 178 slices shown]
[im 60/178  soft-tissue]
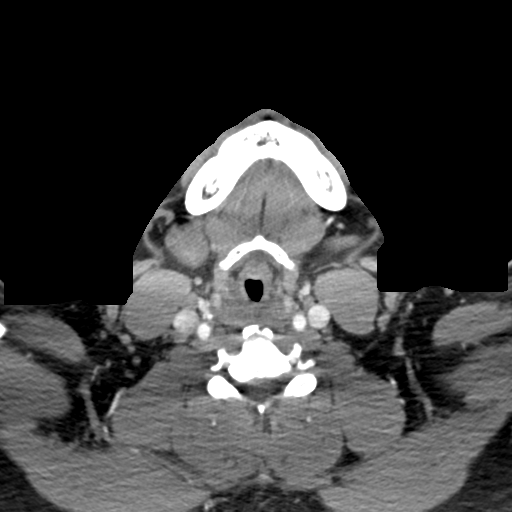
[im 119/178  soft-tissue]
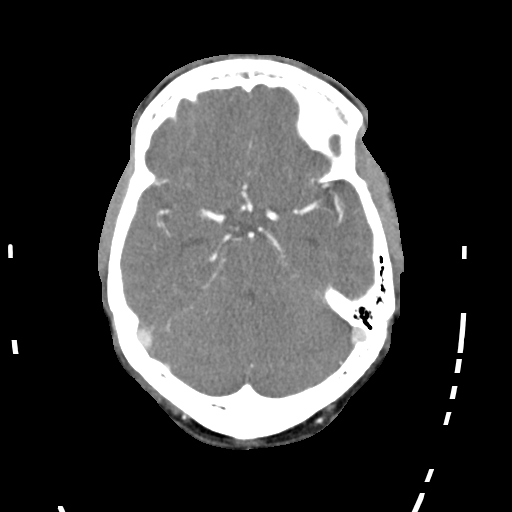

[Series 14: ax thin · axial · 0.56mm/px · z∈[+827,+1057]mm · 5 of 346 slices shown]
[im 58/346  soft-tissue]
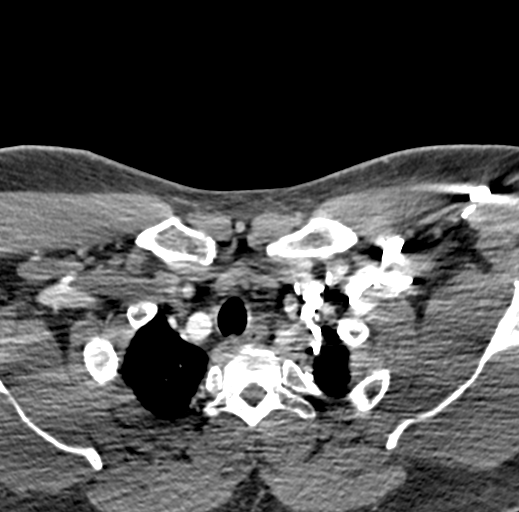
[im 116/346  bone]
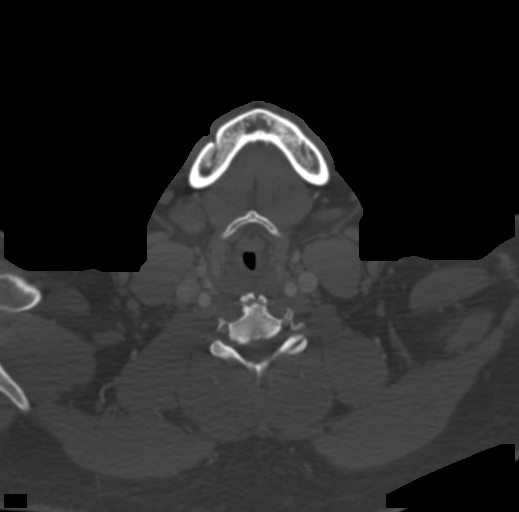
[im 173/346  soft-tissue]
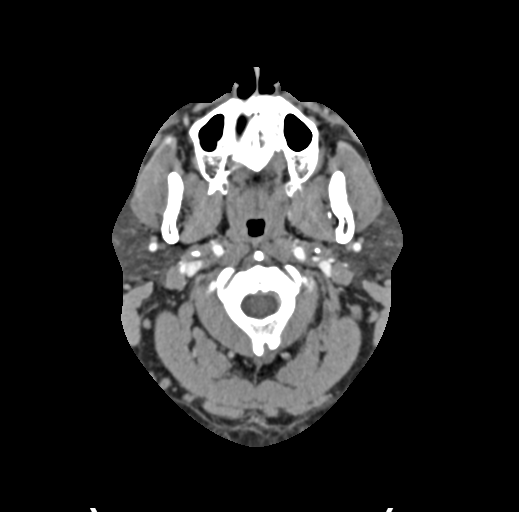
[im 231/346  bone]
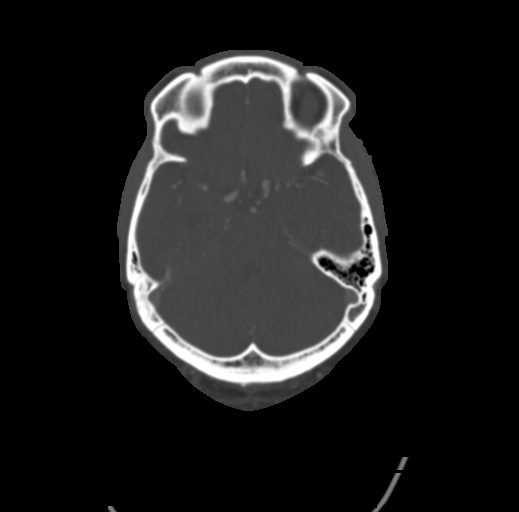
[im 288/346  soft-tissue]
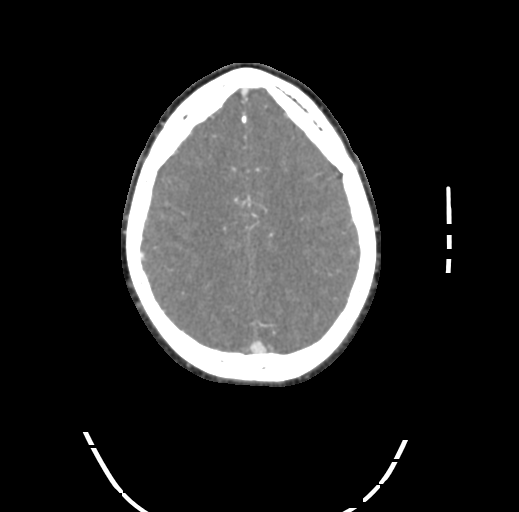

[8 of 34 positions shown; findings below may reference images not displayed]

RADIATION DOSE REDUCTION: This exam was performed according to the
departmental dose-optimization program which includes automated
exposure control, adjustment of the mA and/or kV according to
patient size and/or use of iterative reconstruction technique.

CONTRAST:  125mL OMNIPAQUE IOHEXOL 350 MG/ML SOLN
FINDINGS: CT HEAD FINDINGS

Brain: There is no evidence of acute intracranial hemorrhage,
extra-axial fluid collection, or acute infarct.

Parenchymal volume is normal. The ventricles are normal in size.
Gray-white differentiation is preserved.

There is no mass lesion.  There is no midline shift.

Vascular: See below.

Skull: Normal. Negative for fracture or focal lesion.

Sinuses and orbits: The paranasal sinuses are clear. The globes and
orbits are unremarkable.

Other: None.

Review of the MIP images confirms the above findings

CTA NECK FINDINGS

Aortic arch: The aortic arch is normal. There is a common origin of
the brachiocephalic and left common carotid arteries, a normal
variant. The origins of the major branch vessels are patent. The
subclavian arteries are patent.

Right carotid system: The right common, internal common external
carotid arteries are patent, without hemodynamically significant
stenosis or occlusion. There is no dissection or aneurysm.

Left carotid system: The left common, internal, and external carotid
arteries are patent, without hemodynamically significant stenosis or
occlusion. There is no dissection or aneurysm.

Vertebral arteries: A portion of the proximal V2 segments are
suboptimally evaluated due to streak artifact from surrounding
venous enhancement, but are likely patent. The remainder of the
vertebral arteries are widely patent. There is no evidence of
dissection or aneurysm.

Skeleton: There is mild degenerative change at C4-C5 through C6-C7.
There is no acute osseous abnormality or aggressive osseous lesion.
There is no visible canal hematoma.

Other neck: Numerous dental caries are noted. The soft tissues of
the neck are unremarkable.

Upper chest: The imaged lung apices are clear. The lungs are
assessed in full on the separately dictated CTA
chest/abdomen/pelvis.

Review of the MIP images confirms the above findings

CTA HEAD FINDINGS

Anterior circulation: The intracranial ICAs are patent. The
bilateral MCAs are patent.

The bilateral ACAs are patent.

There is no aneurysm or AVM.

Posterior circulation: The bilateral V4 segments are patent. PICA is
identified bilaterally. The basilar artery is patent.

The bilateral PCAs are patent. Bilateral posterior communicating
arteries are identified.

There is no aneurysm or AVM.

Venous sinuses: Patent.

Anatomic variants: None.

Review of the MIP images confirms the above findings
IMPRESSION: 1. No acute intracranial pathology.
2. Patent vasculature of the head and neck.

## 2021-06-05 MED ORDER — PANTOPRAZOLE SODIUM 40 MG IV SOLR
INTRAVENOUS | Status: AC
  Administered 2021-06-05: 40 mg
  Filled 2021-06-05: qty 10

## 2021-06-05 MED ORDER — PANTOPRAZOLE 80MG IVPB - SIMPLE MED
80.0000 mg | Freq: Once | INTRAVENOUS | Status: AC
  Administered 2021-06-05: 80 mg via INTRAVENOUS
  Filled 2021-06-05: qty 100

## 2021-06-05 MED ORDER — ACETAMINOPHEN 500 MG PO TABS
1000.0000 mg | ORAL_TABLET | Freq: Once | ORAL | Status: AC
  Administered 2021-06-05: 1000 mg via ORAL
  Filled 2021-06-05: qty 2

## 2021-06-05 MED ORDER — ONDANSETRON 4 MG PO TBDP
4.0000 mg | ORAL_TABLET | Freq: Once | ORAL | Status: AC | PRN
  Administered 2021-06-05: 4 mg via ORAL
  Filled 2021-06-05: qty 1

## 2021-06-05 MED ORDER — LIDOCAINE VISCOUS HCL 2 % MT SOLN
15.0000 mL | Freq: Once | OROMUCOSAL | Status: AC
Start: 2021-06-05 — End: 2021-06-05
  Administered 2021-06-05: 15 mL via ORAL
  Filled 2021-06-05: qty 15

## 2021-06-05 MED ORDER — LABETALOL HCL 5 MG/ML IV SOLN
20.0000 mg | Freq: Once | INTRAVENOUS | Status: AC
  Administered 2021-06-05: 20 mg via INTRAVENOUS
  Filled 2021-06-05: qty 4

## 2021-06-05 MED ORDER — IOHEXOL 350 MG/ML SOLN
125.0000 mL | Freq: Once | INTRAVENOUS | Status: AC | PRN
  Administered 2021-06-05: 125 mL via INTRAVENOUS

## 2021-06-05 MED ORDER — ONDANSETRON HCL 4 MG/2ML IJ SOLN
4.0000 mg | Freq: Once | INTRAMUSCULAR | Status: AC
  Administered 2021-06-05: 4 mg via INTRAVENOUS
  Filled 2021-06-05: qty 2

## 2021-06-05 MED ORDER — PANTOPRAZOLE SODIUM 40 MG IV SOLR
INTRAVENOUS | Status: AC
  Administered 2021-06-05: 40 mg
  Filled 2021-06-05: qty 20

## 2021-06-05 MED ORDER — DIPHENHYDRAMINE HCL 50 MG/ML IJ SOLN
25.0000 mg | Freq: Once | INTRAMUSCULAR | Status: AC
Start: 2021-06-05 — End: 2021-06-05
  Administered 2021-06-05: 25 mg via INTRAVENOUS
  Filled 2021-06-05: qty 1

## 2021-06-05 MED ORDER — NITROGLYCERIN 0.4 MG SL SUBL: 0.4000 mg | SUBLINGUAL_TABLET | SUBLINGUAL | Status: DC | PRN

## 2021-06-05 MED ORDER — LACTATED RINGERS IV BOLUS: 500.0000 mL | Freq: Once | INTRAVENOUS | Status: DC

## 2021-06-05 MED ORDER — METOCLOPRAMIDE HCL 5 MG/ML IJ SOLN
10.0000 mg | Freq: Once | INTRAMUSCULAR | Status: AC
Start: 2021-06-05 — End: 2021-06-05
  Administered 2021-06-05: 10 mg via INTRAVENOUS
  Filled 2021-06-05: qty 2

## 2021-06-05 MED ORDER — ALUM & MAG HYDROXIDE-SIMETH 200-200-20 MG/5ML PO SUSP
30.0000 mL | Freq: Once | ORAL | Status: AC
  Administered 2021-06-05: 30 mL via ORAL
  Filled 2021-06-05: qty 30

## 2021-06-05 NOTE — ED Triage Notes (Signed)
Pt endorses chest pain and vomiting blood. States he was seen at [facility] yesterday for CP and HTN. States he has thrown up 5 times today, dark colored blood.

## 2021-06-05 NOTE — ED Provider Notes (Signed)
Patient with history HTN, out of meds for several weeks, here with chest pain, back pain, vomiting (some blood) and R arm numbness. He was noted to be hypertensive here. Awaiting CTA to eval dissection or intracranial process.  Physical Exam  BP (!) 180/129    Pulse 90    Temp 97.8 F (36.6 C)    Resp 18    Ht 5\' 8"  (1.727 m)    Wt 127 kg    SpO2 98%    BMI 42.57 kg/m   Physical Exam AWake and alert No distress Resp even and unlabored Feeling better Procedures  Procedures  ED Course / MDM   Clinical Course as of 06/05/21 1802  Tue Jun 05, 2021  1641 Patient's CTA CAP neg for dissection. CTA head/neck neg for acute process or vascular problems. BP is improving with Labetalol, pain is improved. Will discuss admission with the hospitalist.   [CS]  1711 Spoke with Dr. Jun 07, 2021, Hospitalist who will accept the patient for admission. Requests we also discuss with Neurology.  [CS]  1712 BP has gone back up again.  [CS]  1718 Spoke with Neurology, given the concern for stroke, they would recommend permissive hypertension for the first 24 hours, SBP <220 until a stroke can be ruled. MRI is not available here so will continue to monitor until he is able to get to [facility] for further evaluation.  [CS]    Clinical Course User Index [CS] [facility], MD   Medical Decision Making Problems Addressed: Chest pain, unspecified type: acute illness or injury that poses a threat to life or bodily functions Hypertension, unspecified type: acute illness or injury that poses a threat to life or bodily functions Right arm numbness: acute illness or injury that poses a threat to life or bodily functions  Amount and/or Complexity of Data Reviewed Labs: ordered. Radiology: ordered.  Risk OTC drugs. Prescription drug management. Decision regarding hospitalization.         Pollyann Savoy, MD 06/05/21 [phone]

## 2021-06-05 NOTE — ED Provider Notes (Signed)
[facility] EMERGENCY DEPT Provider Note   CSN: [id number] Arrival date & time: 06/05/21  1114     History  Chief Complaint  Patient presents with   Chest Pain   Hematemesis    Alejandro Santos is a 41 y.o. male.   Chest Pain Associated symptoms: headache    41 year old male presenting to the emergency department with chief complaint of chest pain.  The patient states that he has had chest pain that has been intermittent since being seen at [facility] yesterday in the emergency department in [place].  He states that his chest pain is left-sided, substernal with a pressure and squeezing sensation.  He was seen yesterday in the emergency department and had negative troponins x2.  Since then, he has had nausea and vomiting and has thrown up 5 times today with dark maroon-colored blood noted.  He states that initially his vomitus was normal in color and subsequently became bloody.  Additionally, he complains of a headache, located in the front of his head radiating to his occiput with some associated neck pain.  He denies any fevers.  On chart review, the patient was seen in the [facility] emergency department with a chief complaint of headache, chest pain, hand numbness.  He was given 1 aspirin and 2 nitroglycerin.  He has a visual impairment noted at baseline.  He had tingling in his fingers bilaterally with associated nausea.  He is not a smoker.  Cardiac troponins were normal.  Chest x-ray imaging did not reveal any acute abnormalities.  An EKG revealed sinus rhythm with a rate of 160 but no signs of acute ischemia.  His creatinine was 1.5 yesterday and was 1.23 in April.  The patient was subsequently discharged.  He presents today with persistent symptoms. He has had numbness in his RUE since noon yesterday.  Home Medications Prior to Admission medications   Medication Sig Start Date End Date Taking? Authorizing Provider  diphenhydramine-acetaminophen (TYLENOL PM) 25-500 MG TABS  tablet Take 1 tablet by mouth at bedtime as needed.   Yes [provider]  lisinopril-hydrochlorothiazide (ZESTORETIC) 20-25 MG tablet Take 1 tablet by mouth daily. 08/02/19  Yes Chrismon, [name], PA-C  metoprolol succinate (TOPROL-XL) 50 MG 24 hr tablet TAKE 1 TABLET BY MOUTH EVERY DAY WITH OR IMMEDIATELY FOLLOWING A MEAL 07/03/19  Yes Chrismon, [name], PA-C  pregabalin (LYRICA) 50 MG capsule Take 50 mg by mouth 3 (three) times daily.    [provider]      Allergies    Patient has no known allergies.    Review of Systems   Review of Systems  Cardiovascular:  Positive for chest pain.  Neurological:  Positive for headaches.  All other systems reviewed and are negative.  Physical Exam Updated Vital Signs BP 122/71 (BP Location: Left Arm)    Pulse 94    Temp 99.2 F (37.3 C) (Oral)    Resp (!) 22    Ht 5\' 8"  (1.727 m)    Wt 127 kg    SpO2 94%    BMI 42.57 kg/m  Physical Exam Vitals and nursing note reviewed.  Constitutional:      General: He is not in acute distress.    Appearance: He is well-developed.  HENT:     Head: Normocephalic and atraumatic.  Eyes:     Conjunctiva/sclera: Conjunctivae normal.  Cardiovascular:     Rate and Rhythm: Normal rate and regular rhythm.     Heart sounds: No murmur heard. Pulmonary:  Effort: Pulmonary effort is normal. No respiratory distress.     Breath sounds: Normal breath sounds.  Abdominal:     Palpations: Abdomen is soft.     Tenderness: There is no abdominal tenderness.  Musculoskeletal:        General: No swelling.     Cervical back: Neck supple.  Skin:    General: Skin is warm and dry.     Capillary Refill: Capillary refill takes less than 2 seconds.  Neurological:     Mental Status: He is alert.     Comments: MENTAL STATUS EXAM:    Orientation: Alert and oriented to person, place and time.  Memory: Cooperative, follows commands well.  Language: Speech is clear and language is normal.   CRANIAL NERVES:     CN 2 (Optic): Visual fields intact to confrontation.  CN 3,4,6 (EOM): Pupils equal and reactive to light. Full extraocular eye movement without nystagmus.  CN 5 (Trigeminal): Facial sensation is normal, no weakness of masticatory muscles.  CN 7 (Facial): No facial weakness or asymmetry.  CN 8 (Auditory): Auditory acuity grossly normal.  CN 9,10 (Glossophar): The uvula is midline, the palate elevates symmetrically.  CN 11 (spinal access): Normal sternocleidomastoid and trapezius strength.  CN 12 (Hypoglossal): The tongue is midline. No atrophy or fasciculations.Marland Kitchen   MOTOR:  Muscle Strength: 5/5RUE, 5/5LUE, 5/5RLE, 5/5LLE.   COORDINATION:   Intact finger-to-nose, no tremor.   SENSATION:   Intact to light touch all four extremities, decreased sensation in the RUE noted.    Psychiatric:        Mood and Affect: Mood normal.    ED Results / Procedures / Treatments   Labs (all labs ordered are listed, but only abnormal results are displayed) Labs Reviewed  BASIC METABOLIC PANEL - Abnormal; Notable for the following components:      Result Value   Chloride 97 (*)    Glucose, Bld 142 (*)    Creatinine, Ser 1.52 (*)    GFR, Estimated 59 (*)    All other components within normal limits  CBC - Abnormal; Notable for the following components:   Platelets 409 (*)    All other components within normal limits  SEDIMENTATION RATE  MAGNESIUM  C-REACTIVE PROTEIN  TROPONIN I (HIGH SENSITIVITY)  TROPONIN I (HIGH SENSITIVITY)    EKG EKG Interpretation  Date/Time:  Tuesday June 05 2021 11:21:30 EST Ventricular Rate:  90 PR Interval:  162 QRS Duration: 82 QT Interval:  378 QTC Calculation: 462 R Axis:   212 Text Interpretation: Normal sinus rhythm Right superior axis deviation Right ventricular hypertrophy Abnormal QRS-T angle, consider primary T wave abnormality Abnormal ECG When compared with ECG of 14-Sep-2016 22:22, Vent. rate has decreased BY  52 BPM QRS axis Shifted left  Nonspecific T wave abnormality now evident in Lateral leads Confirmed by Regan Lemming ([id number]) on 06/05/2021 1:54:32 PM  Radiology CT ANGIO HEAD NECK W WO CM  Result Date: 06/05/2021 CLINICAL DATA:  Chest pain, hematemesis, neuro deficit EXAM: CT ANGIOGRAPHY HEAD AND NECK TECHNIQUE: Multidetector CT imaging of the head and neck was performed using the standard protocol during bolus administration of intravenous contrast. Multiplanar CT image reconstructions and MIPs were obtained to evaluate the vascular anatomy. Carotid stenosis measurements (when applicable) are obtained utilizing NASCET criteria, using the distal internal carotid diameter as the denominator. RADIATION DOSE REDUCTION: This exam was performed according to the departmental dose-optimization program which includes automated exposure control, adjustment of the mA and/or kV according to patient  size and/or use of iterative reconstruction technique. CONTRAST:  151mL OMNIPAQUE IOHEXOL 350 MG/ML SOLN COMPARISON:  CT head 11/01/2016 FINDINGS: CT HEAD FINDINGS Brain: There is no evidence of acute intracranial hemorrhage, extra-axial fluid collection, or acute infarct. Parenchymal volume is normal. The ventricles are normal in size. Gray-white differentiation is preserved. There is no mass lesion.  There is no midline shift. Vascular: See below. Skull: Normal. Negative for fracture or focal lesion. Sinuses and orbits: The paranasal sinuses are clear. The globes and orbits are unremarkable. Other: None. Review of the MIP images confirms the above findings CTA NECK FINDINGS Aortic arch: The aortic arch is normal. There is a common origin of the brachiocephalic and left common carotid arteries, a normal variant. The origins of the major branch vessels are patent. The subclavian arteries are patent. Right carotid system: The right common, internal common external carotid arteries are patent, without hemodynamically significant stenosis or occlusion. There is  no dissection or aneurysm. Left carotid system: The left common, internal, and external carotid arteries are patent, without hemodynamically significant stenosis or occlusion. There is no dissection or aneurysm. Vertebral arteries: A portion of the proximal V2 segments are suboptimally evaluated due to streak artifact from surrounding venous enhancement, but are likely patent. The remainder of the vertebral arteries are widely patent. There is no evidence of dissection or aneurysm. Skeleton: There is mild degenerative change at C4-C5 through C6-C7. There is no acute osseous abnormality or aggressive osseous lesion. There is no visible canal hematoma. Other neck: Numerous dental caries are noted. The soft tissues of the neck are unremarkable. Upper chest: The imaged lung apices are clear. The lungs are assessed in full on the separately dictated CTA chest/abdomen/pelvis. Review of the MIP images confirms the above findings CTA HEAD FINDINGS Anterior circulation: The intracranial ICAs are patent. The bilateral MCAs are patent. The bilateral ACAs are patent. There is no aneurysm or AVM. Posterior circulation: The bilateral V4 segments are patent. PICA is identified bilaterally. The basilar artery is patent. The bilateral PCAs are patent. Bilateral posterior communicating arteries are identified. There is no aneurysm or AVM. Venous sinuses: Patent. Anatomic variants: None. Review of the MIP images confirms the above findings IMPRESSION: 1. No acute intracranial pathology. 2. Patent vasculature of the head and neck. Electronically Signed   By: Valetta Mole M.D.   On: 06/05/2021 15:40   DG Chest 2 View  Result Date: 06/05/2021 CLINICAL DATA:  Chest pain EXAM: CHEST - 2 VIEW COMPARISON:  Chest x-ray 05/01/2015 FINDINGS: Heart size upper normal. Mediastinal contours are within normal limits. No suspicious pulmonary opacities identified. No pleural effusion or pneumothorax visualized. No acute osseous abnormality  appreciated. IMPRESSION: No acute intrathoracic process identified. Electronically Signed   By: Ofilia Neas M.D.   On: 06/05/2021 12:08   CT Angio Chest/Abd/Pel for Dissection W and/or Wo Contrast  Result Date: 06/05/2021 CLINICAL DATA:  Acute aortic syndrome. Chest pain and vomiting blood. EXAM: CT ANGIOGRAPHY CHEST, ABDOMEN AND PELVIS TECHNIQUE: Non-contrast CT of the chest was initially obtained. Multidetector CT imaging through the chest, abdomen and pelvis was performed using the standard protocol during bolus administration of intravenous contrast. Multiplanar reconstructed images and MIPs were obtained and reviewed to evaluate the vascular anatomy. RADIATION DOSE REDUCTION: This exam was performed according to the departmental dose-optimization program which includes automated exposure control, adjustment of the mA and/or kV according to patient size and/or use of iterative reconstruction technique. CONTRAST:  173mL OMNIPAQUE IOHEXOL 350 MG/ML SOLN COMPARISON:  CT  stone study 09/15/2016 FINDINGS: CTA CHEST FINDINGS Cardiovascular: Pre contrast imaging shows no hyperdense crescent in the wall of the thoracic aorta to suggest acute intramural hematoma. Imaging after IV contrast administration shows no thoracic aortic aneurysm with ascending thoracic aorta measuring approximately 3.5 cm diameter. No dissection of the thoracic aorta. Bovine arch vessel anatomy evident, normal variant. Arch vessel anatomy is widely patent. Heart size upper normal. No pericardial effusion. Mediastinum/Nodes: No mediastinal lymphadenopathy. There is no hilar lymphadenopathy. The esophagus has normal imaging features. There is no axillary lymphadenopathy. Lungs/Pleura: No suspicious pulmonary nodule or mass. No focal airspace consolidation. There is no evidence of pleural effusion. Musculoskeletal: No worrisome lytic or sclerotic osseous abnormality. Review of the MIP images confirms the above findings. CTA ABDOMEN AND  PELVIS FINDINGS VASCULAR Aorta: Normal caliber aorta without aneurysm, dissection, vasculitis or significant stenosis. Celiac: Patent without evidence of aneurysm, dissection, vasculitis or significant stenosis. SMA: Patent without evidence of aneurysm, dissection, vasculitis or significant stenosis. Renals: Both renal arteries are patent without evidence of aneurysm, dissection, vasculitis, fibromuscular dysplasia or significant stenosis. IMA: Patent without evidence of aneurysm, dissection, vasculitis or significant stenosis. Inflow: Patent without evidence of aneurysm, dissection, vasculitis or significant stenosis. Veins: No obvious venous abnormality within the limitations of this arterial phase study. Review of the MIP images confirms the above findings. NON-VASCULAR Hepatobiliary: No suspicious focal abnormality within the liver parenchyma. There is no evidence for gallstones, gallbladder wall thickening, or pericholecystic fluid. No intrahepatic or extrahepatic biliary dilation. Pancreas: No focal mass lesion. No dilatation of the main duct. No intraparenchymal cyst. No peripancreatic edema. Spleen: No splenomegaly. No focal mass lesion. Adrenals/Urinary Tract: No adrenal nodule or mass. Kidneys unremarkable. No evidence for hydroureter. The urinary bladder appears normal for the degree of distention. Stomach/Bowel: Stomach is unremarkable. No gastric wall thickening. No evidence of outlet obstruction. Duodenum is normally positioned as is the ligament of Treitz. No small bowel wall thickening. No small bowel dilatation. The terminal ileum is normal. Mild distension noted mid appendix with no periappendiceal edema or inflammation. No gross colonic mass. No colonic wall thickening. Lymphatic: There is no gastrohepatic or hepatoduodenal ligament lymphadenopathy. No retroperitoneal or mesenteric lymphadenopathy. No pelvic sidewall lymphadenopathy. Reproductive: The prostate gland and seminal vesicles are  unremarkable. Other: No intraperitoneal free fluid. Musculoskeletal: No worrisome lytic or sclerotic osseous abnormality. Review of the MIP images confirms the above findings. IMPRESSION: 1. No thoracoabdominal aortic aneurysm or dissection. No evidence for acute intramural hematoma of the thoracic aorta. 2. No acute findings in the chest, abdomen, or pelvis to explain the patient's history of chest pain and vomiting. Electronically Signed   By: Misty Stanley M.D.   On: 06/05/2021 15:47    Procedures Procedures    Medications Ordered in ED Medications  ondansetron (ZOFRAN-ODT) disintegrating tablet 4 mg (4 mg Oral Given 06/05/21 1131)  labetalol (NORMODYNE) injection 20 mg (20 mg Intravenous Given 06/05/21 1433)  alum & mag hydroxide-simeth (MAALOX/MYLANTA) 200-200-20 MG/5ML suspension 30 mL (30 mLs Oral Given 06/05/21 1423)    And  lidocaine (XYLOCAINE) 2 % viscous mouth solution 15 mL (15 mLs Oral Given 06/05/21 1423)  ondansetron (ZOFRAN) injection 4 mg (4 mg Intravenous Given 06/05/21 1520)  metoCLOPramide (REGLAN) injection 10 mg (10 mg Intravenous Given 06/05/21 1441)  diphenhydrAMINE (BENADRYL) injection 25 mg (25 mg Intravenous Given 06/05/21 1441)  acetaminophen (TYLENOL) tablet 1,000 mg (1,000 mg Oral Given 06/05/21 1444)  pantoprazole (PROTONIX) 80 mg /NS 100 mL IVPB (0 mg Intravenous Stopped 06/05/21 1624)  iohexol (OMNIPAQUE) 350 MG/ML injection 125 mL (125 mLs Intravenous Contrast Given 06/05/21 1518)  pantoprazole (PROTONIX) 40 MG injection (40 mg  Given 06/05/21 1535)  pantoprazole (PROTONIX) 40 MG injection (40 mg  Given 06/05/21 1534)  acetaminophen (TYLENOL) tablet 1,000 mg (1,000 mg Oral Given 06/05/21 2018)    ED Course/ Medical Decision Making/ A&P Clinical Course as of 06/06/21 0005  Tue Jun 05, 2021  1641 Patient's CTA CAP neg for dissection. CTA head/neck neg for acute process or vascular problems. BP is improving with Labetalol, pain is improved. Will discuss admission with  the hospitalist.   [CS]  1711 Spoke with Dr. Tyrell Antonio, Hospitalist who will accept the patient for admission. Requests we also discuss with Neurology.  [CS]  [id number] BP has gone back up again.  [CS]  20 Spoke with Neurology, given the concern for stroke, they would recommend permissive hypertension for the first 24 hours, SBP <220 until a stroke can be ruled. MRI is not available here so will continue to monitor until he is able to get to [facility] for further evaluation.  [CS]    Clinical Course User Index [CS] Truddie Hidden, MD                           Medical Decision Making Amount and/or Complexity of Data Reviewed Labs: ordered. Radiology: ordered.  Risk OTC drugs. Prescription drug management. Decision regarding hospitalization.   41 year old male presenting to the emergency department with chief complaint of chest pain.  The patient states that he has had chest pain that has been intermittent since being seen at [facility] yesterday in the emergency department in [place].  He states that his chest pain is left-sided, substernal with a pressure and squeezing sensation.  He was seen yesterday in the emergency department and had negative troponins x2.  Since then, he has had nausea and vomiting and has thrown up 5 times today with dark maroon-colored blood noted.  He states that initially his vomitus was normal in color and subsequently became bloody.  Additionally, he complains of a headache, located in the front of his head radiating to his occiput with some associated neck pain.  He denies any fevers.  On chart review, the patient was seen in the [facility] emergency department with a chief complaint of headache, chest pain, hand numbness.  He was given 1 aspirin and 2 nitroglycerin.  He has a visual impairment noted at baseline.  He had tingling in his fingers bilaterally with associated nausea.  He is not a smoker.  Cardiac troponins were normal.  Chest x-ray imaging did not reveal any acute  abnormalities.  An EKG revealed sinus rhythm with a rate of 160 but no signs of acute ischemia.  His creatinine was 1.5 yesterday and was 1.23 in April.  The patient was subsequently discharged.  He presents today with persistent symptoms. He has had numbness in his RUE since noon yesterday.  On arrival, the patient was afebrile, hypertensive BP 181/126, not tachycardic or tachypneic, saturating 96% on room air.  Sinus rhythm noted on cardiac telemetry.  The patient presents with crushing substernal chest discomfort with associated right arm numbness, headache, neck pain, hematemesis.  Concern for possible Mallory-Weiss tear in the setting of multiple episodes of emesis.  Additional concern for possible gastritis or peptic ulcer disease.  Differential of the patient's chest pain includes ACS, PE, aortic dissection, hypertensive emergency, pulmonary edema, pneumothorax.  Additional concern for possible non-LVO  CVA in the setting of the patient's right upper extremity numbness.  Last known normal was around noon yesterday so code stroke not activated on patient arrival.  Work-up initiated to include CTA head and neck and CTA chest abdomen pelvis to evaluate for dissection, vascular abnormality or intracranial abnormality.  The patient was administered a migraine cocktail, IV Protonix due to his episodes of hematemesis.  There is initial EKG revealed normal sinus rhythm, ventricular rate 9 0, some nonspecific T wave changes present, no STEMI.   His chest x-ray was reviewed by myself and radiology and revealed no acute cardiac or pulmonary abnormality.  CBC was without leukocytosis, initial troponin was normal, BMP with mild elevation in serum creatinine to 1.52 from baseline of 1.23 in April.  Concern for possible hypertensive emergency with worsening renal function.  The patient was administered labetalol 20 mg.  Remainder of the patient's work-up was pending at time of signout.  Signout given to Dr. Karle Starch at  [phone].   Final Clinical Impression(s) / ED Diagnoses Final diagnoses:  Hypertension, unspecified type  Chest pain, unspecified type  Right arm numbness    Rx / DC Orders ED Discharge Orders     None         Regan Lemming, MD 06/06/21 0007

## 2021-06-06 ENCOUNTER — Observation Stay (HOSPITAL_COMMUNITY): Payer: 59

## 2021-06-06 ENCOUNTER — Observation Stay (HOSPITAL_BASED_OUTPATIENT_CLINIC_OR_DEPARTMENT_OTHER): Payer: 59

## 2021-06-06 ENCOUNTER — Encounter (HOSPITAL_COMMUNITY): Payer: Self-pay | Admitting: Internal Medicine

## 2021-06-06 ENCOUNTER — Other Ambulatory Visit (HOSPITAL_COMMUNITY): Payer: Self-pay

## 2021-06-06 DIAGNOSIS — R079 Chest pain, unspecified: Secondary | ICD-10-CM

## 2021-06-06 DIAGNOSIS — I161 Hypertensive emergency: Secondary | ICD-10-CM | POA: Diagnosis not present

## 2021-06-06 DIAGNOSIS — R200 Anesthesia of skin: Secondary | ICD-10-CM | POA: Diagnosis present

## 2021-06-06 DIAGNOSIS — R202 Paresthesia of skin: Secondary | ICD-10-CM | POA: Diagnosis not present

## 2021-06-06 DIAGNOSIS — R0789 Other chest pain: Secondary | ICD-10-CM | POA: Diagnosis not present

## 2021-06-06 HISTORY — DX: Other chest pain: R07.89

## 2021-06-06 LAB — CBC WITH DIFFERENTIAL/PLATELET
Abs Immature Granulocytes: 0.02 10*3/uL (ref 0.00–0.07)
Basophils Absolute: 0 10*3/uL (ref 0.0–0.1)
Basophils Relative: 0 %
Eosinophils Absolute: 0 10*3/uL (ref 0.0–0.5)
Eosinophils Relative: 0 %
HCT: 43.9 % (ref 39.0–52.0)
Hemoglobin: 14.5 g/dL (ref 13.0–17.0)
Immature Granulocytes: 0 %
Lymphocytes Relative: 30 %
Lymphs Abs: 2.8 10*3/uL (ref 0.7–4.0)
MCH: 28.8 pg (ref 26.0–34.0)
MCHC: 33 g/dL (ref 30.0–36.0)
MCV: 87.3 fL (ref 80.0–100.0)
Monocytes Absolute: 0.9 10*3/uL (ref 0.1–1.0)
Monocytes Relative: 10 %
Neutro Abs: 5.5 10*3/uL (ref 1.7–7.7)
Neutrophils Relative %: 60 %
Platelets: 406 10*3/uL — ABNORMAL HIGH (ref 150–400)
RBC: 5.03 MIL/uL (ref 4.22–5.81)
RDW: 13.7 % (ref 11.5–15.5)
WBC: 9.3 10*3/uL (ref 4.0–10.5)
nRBC: 0 % (ref 0.0–0.2)

## 2021-06-06 LAB — COMPREHENSIVE METABOLIC PANEL
ALT: 27 U/L (ref 0–44)
AST: 24 U/L (ref 15–41)
Albumin: 3.8 g/dL (ref 3.5–5.0)
Alkaline Phosphatase: 51 U/L (ref 38–126)
Anion gap: 11 (ref 5–15)
BUN: 19 mg/dL (ref 6–20)
CO2: 29 mmol/L (ref 22–32)
Calcium: 8.6 mg/dL — ABNORMAL LOW (ref 8.9–10.3)
Chloride: 97 mmol/L — ABNORMAL LOW (ref 98–111)
Creatinine, Ser: 2.01 mg/dL — ABNORMAL HIGH (ref 0.61–1.24)
GFR, Estimated: 42 mL/min — ABNORMAL LOW (ref >60)
Glucose, Bld: 105 mg/dL — ABNORMAL HIGH (ref 70–99)
Potassium: 3.6 mmol/L (ref 3.5–5.1)
Sodium: 137 mmol/L (ref 135–145)
Total Bilirubin: 0.9 mg/dL (ref 0.3–1.2)
Total Protein: 7.3 g/dL (ref 6.5–8.1)

## 2021-06-06 LAB — MAGNESIUM: Magnesium: 1.9 mg/dL (ref 1.7–2.4)

## 2021-06-06 LAB — LIPID PANEL
Cholesterol: 223 mg/dL — ABNORMAL HIGH (ref 0–200)
HDL: 45 mg/dL (ref >40)
LDL Cholesterol: 151 mg/dL — ABNORMAL HIGH (ref 0–99)
Total CHOL/HDL Ratio: 5 RATIO
Triglycerides: 134 mg/dL (ref <150)
VLDL: 27 mg/dL (ref 0–40)

## 2021-06-06 LAB — ECHOCARDIOGRAM COMPLETE BUBBLE STUDY
Area-P 1/2: 3.68 cm2
Calc EF: 48 %
S' Lateral: 3.1 cm
Single Plane A2C EF: 49.2 %
Single Plane A4C EF: 52.1 %

## 2021-06-06 LAB — HEMOGLOBIN A1C
Hgb A1c MFr Bld: 5.6 % (ref 4.8–5.6)
Mean Plasma Glucose: 114.02 mg/dL

## 2021-06-06 LAB — RESP PANEL BY RT-PCR (FLU A&B, COVID) ARPGX2
Influenza A by PCR: NEGATIVE
Influenza B by PCR: NEGATIVE
SARS Coronavirus 2 by RT PCR: NEGATIVE

## 2021-06-06 LAB — MRSA NEXT GEN BY PCR, NASAL: MRSA by PCR Next Gen: NOT DETECTED

## 2021-06-06 LAB — HIV ANTIBODY (ROUTINE TESTING W REFLEX): HIV Screen 4th Generation wRfx: NONREACTIVE

## 2021-06-06 IMAGING — MR MR HEAD W/O CM
12 of 13 series · 44 of 48 positions shown · non-contrast
Comparison: CT head and CTA head and neck yesterday.

CLINICAL DATA: 40-year-old male with chest pain, hematemesis,
neurologic deficit.

EXAM:
MRI HEAD WITHOUT CONTRAST
TECHNIQUE: Multiplanar, multiecho pulse sequences of the brain and surrounding
structures were obtained without intravenous contrast.

[Series 5: DWI · axial · 3.0mm · 0.96mm/px · z∈[-79,+82]mm · 7 of 109 slices shown (1 of 4)]
[im 1/109]
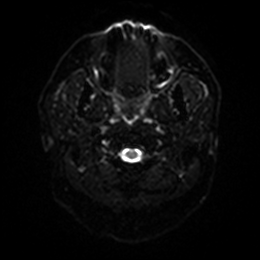
[im 19/109]
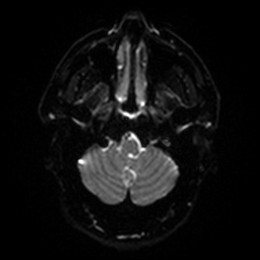
[im 37/109]
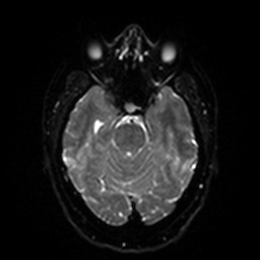
[im 55/109]
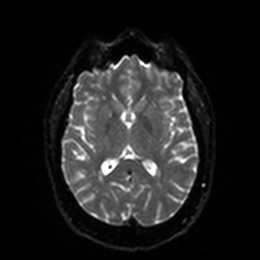
[im 73/109]
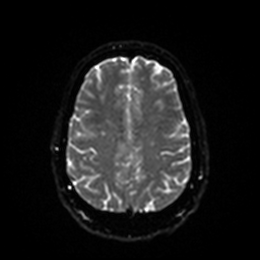
[im 91/109]
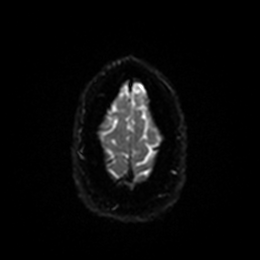
[im 109/109]
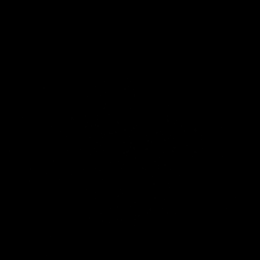

[Series 6: DWI · axial · 3.0mm · 0.96mm/px · z∈[-79,+79]mm · 4 of 54 slices shown (2 of 4)]
[im 1/54]
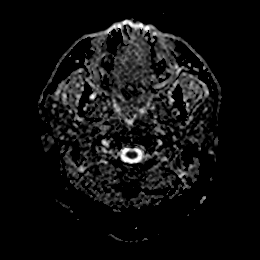
[im 18/54]
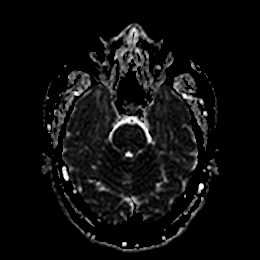
[im 36/54]
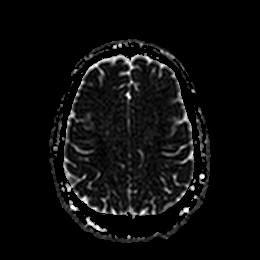
[im 54/54]
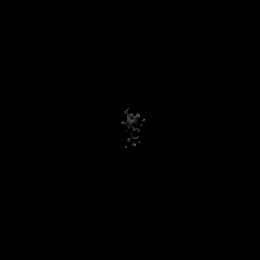

[Series 7: DWI · coronal · 4.0mm · 0.88mm/px · 6 of 86 slices shown (3 of 4)]
[im 1/86]
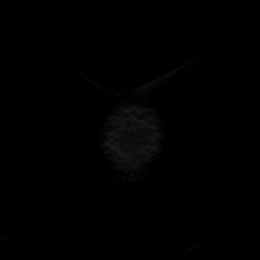
[im 18/86]
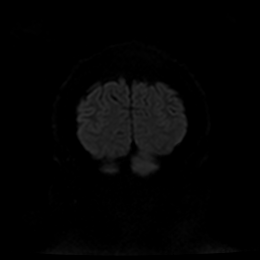
[im 35/86]
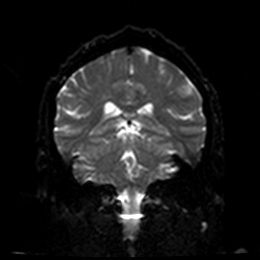
[im 52/86]
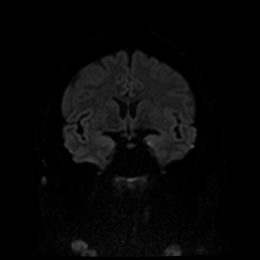
[im 69/86]
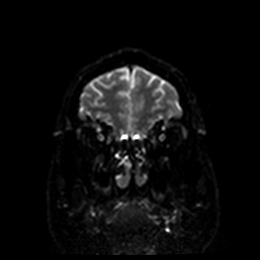
[im 86/86]
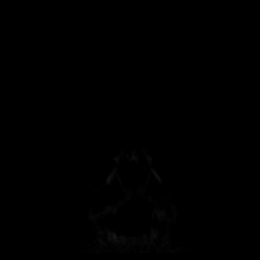

[Series 8: DWI · coronal · 4.0mm · 0.88mm/px · 3 of 43 slices shown (4 of 4)]
[im 1/43]
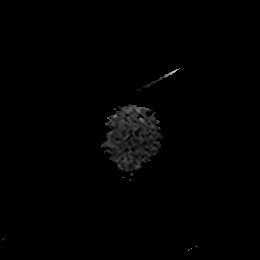
[im 22/43]
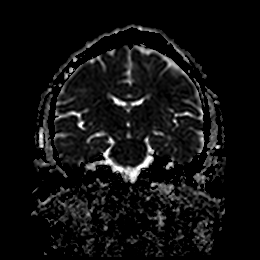
[im 43/43]
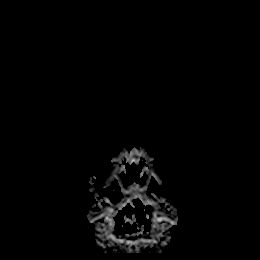

[Series 9: T1 · sagittal · 5.0mm · 0.78mm/px · 2 of 31 slices shown]
[im 1/31]
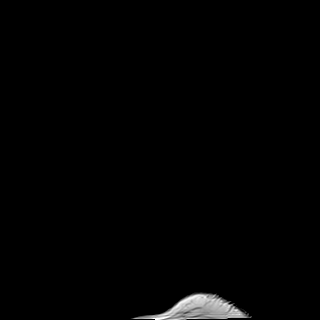
[im 31/31]
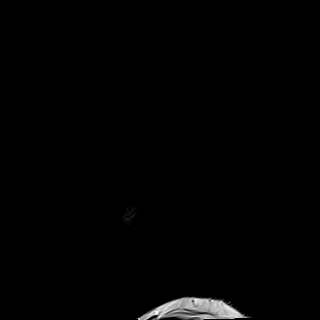

[Series 10: T2 · axial · 5.0mm · 0.78mm/px · z∈[-86,+82]mm · 2 of 29 slices shown (1 of 2)]
[im 1/29]
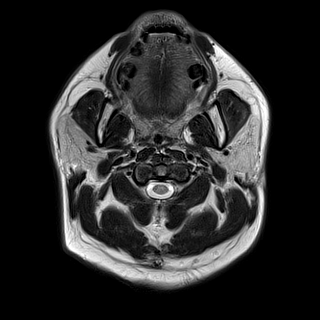
[im 29/29]
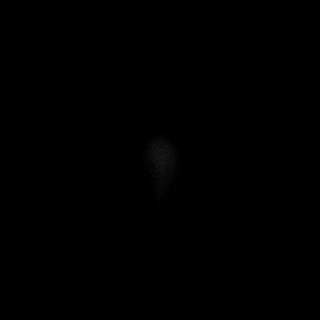

[Series 11: FLAIR · axial · 5.0mm · 0.49mm/px · z∈[-86,+81]mm · 2 of 29 slices shown]
[im 1/29]
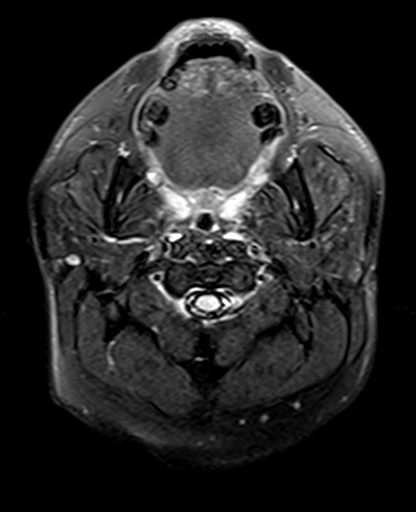
[im 29/29]
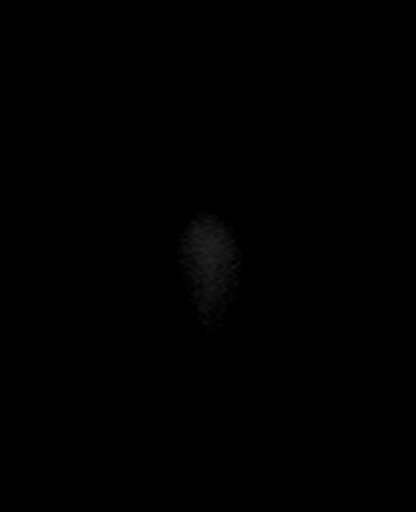

[Series 12: mag_images · axial · 3.0mm · 0.90mm/px · z∈[-81,+83]mm · 4 of 56 slices shown]
[im 1/56]
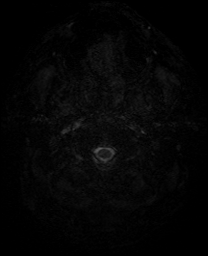
[im 19/56]
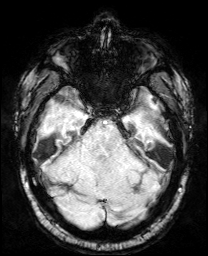
[im 37/56]
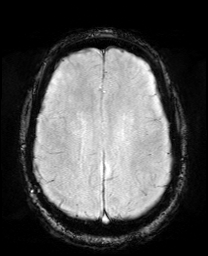
[im 56/56]
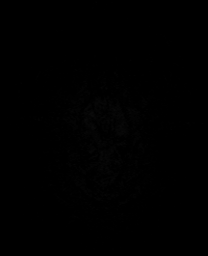

[Series 13: pha_images · axial · 3.0mm · 0.90mm/px · z∈[-81,+83]mm · 4 of 56 slices shown]
[im 1/56]
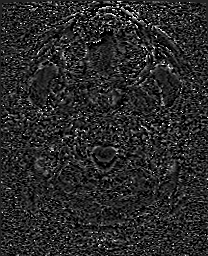
[im 19/56]
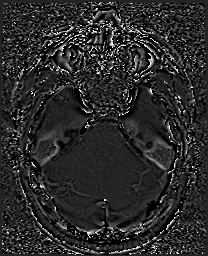
[im 37/56]
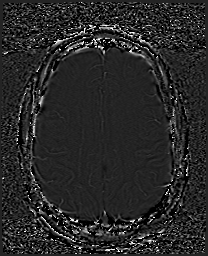
[im 56/56]
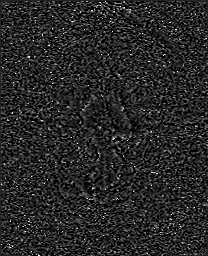

[Series 14: swi_images · axial · 3.0mm · 0.90mm/px · z∈[-81,+83]mm · 4 of 56 slices shown]
[im 1/56]
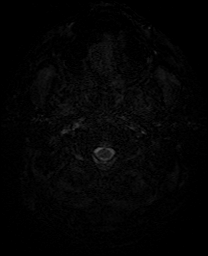
[im 19/56]
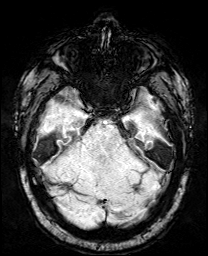
[im 37/56]
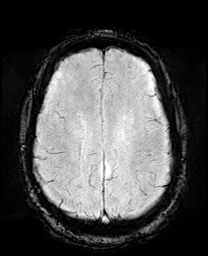
[im 56/56]
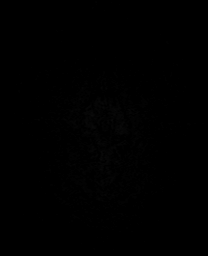

[Series 15: mip_images(sw) · axial · 24.0mm · 0.90mm/px · z∈[-71,+73]mm · 3 of 49 slices shown]
[im 1/49]
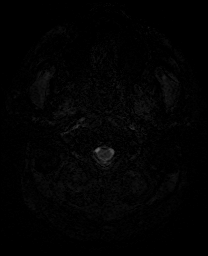
[im 25/49]
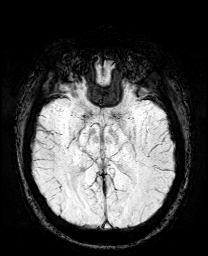
[im 49/49]
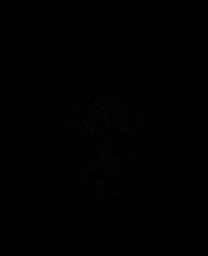

[Series 17: T2 · coronal · 5.0mm · 0.34mm/px · 3 of 36 slices shown (2 of 2)]
[im 1/36]
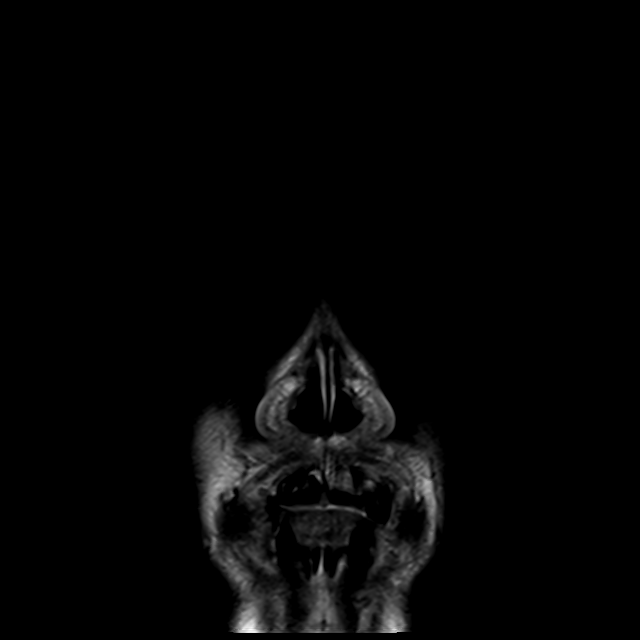
[im 18/36]
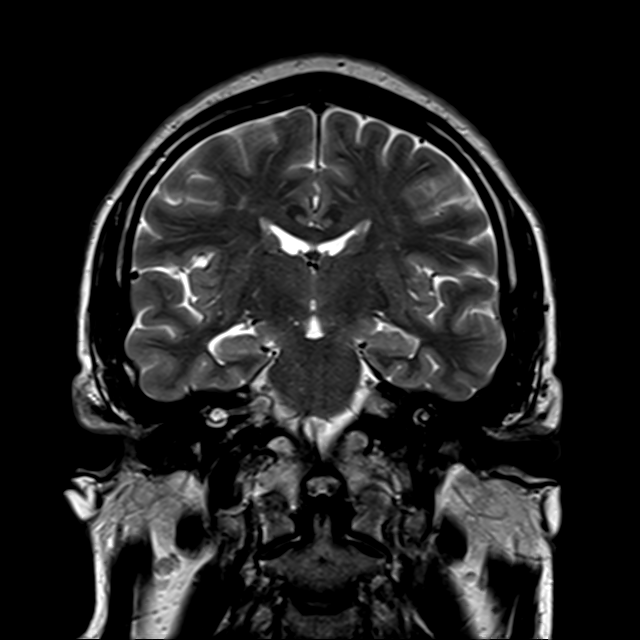
[im 36/36]
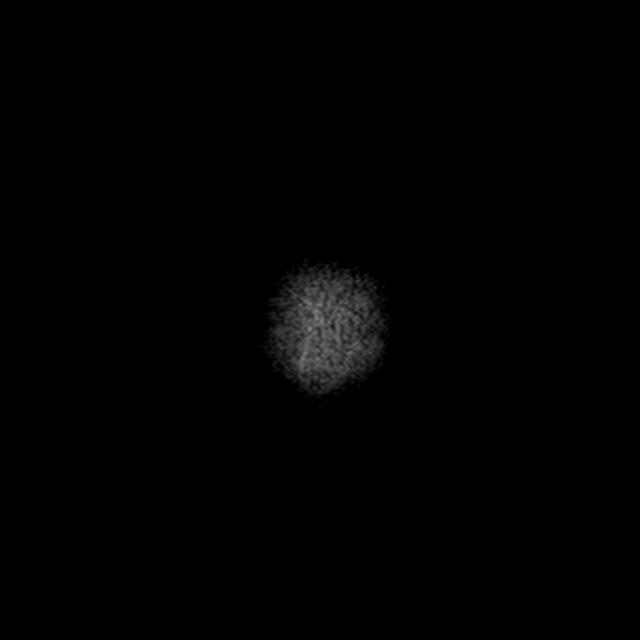

[44 of 48 positions shown; findings below may reference images not displayed]

FINDINGS: Brain: No restricted diffusion to suggest acute infarction. No
midline shift, mass effect, evidence of mass lesion,
ventriculomegaly, extra-axial collection or acute intracranial
hemorrhage. Cervicomedullary junction and pituitary are within
normal limits.

Patchy periventricular, periatrial white matter T2 and FLAIR
hyperintensity is nonspecific. Minimal additional mostly central
white matter T2 and FLAIR heterogeneity. No cortical
encephalomalacia or chronic cerebral blood products identified. Deep
gray matter nuclei, brainstem and cerebellum appear normal.

Vascular: Major intracranial vascular flow voids are preserved.

Skull and upper cervical spine: Negative visible cervical spine.
Visualized bone marrow signal is within normal limits.

Sinuses/Orbits: Disconjugate gaze but otherwise negative orbits.
Paranasal sinuses and mastoids are stable and well aerated.

Other: Grossly normal visible internal auditory structures. Negative
visible scalp and face.
IMPRESSION: 1. No acute intracranial abnormality.
2. Mildly to moderately age advanced but nonspecific cerebral white
matter signal changes.

## 2021-06-06 IMAGING — MR MR CERVICAL SPINE W/O CM
4 of 5 series · 19 of 48 positions shown · non-contrast
Comparison: None.

CLINICAL DATA: Chronic myelopathy cervical spine.

EXAM:
MRI CERVICAL SPINE WITHOUT CONTRAST
TECHNIQUE: Multiplanar, multisequence MR imaging of the cervical spine was
performed. No intravenous contrast was administered.

[Series 3: T2 · sagittal · 3.0mm · 0.43mm/px · 4 of 16 slices shown (1 of 2)]
[im 1/16]
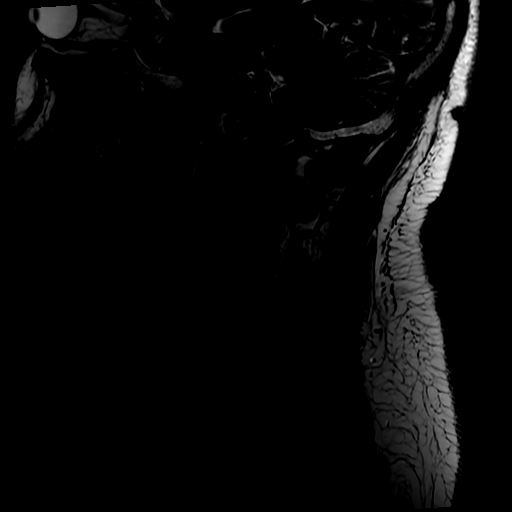
[im 6/16]
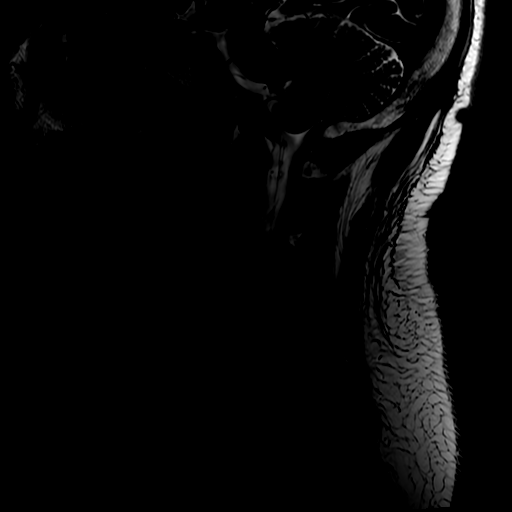
[im 11/16]
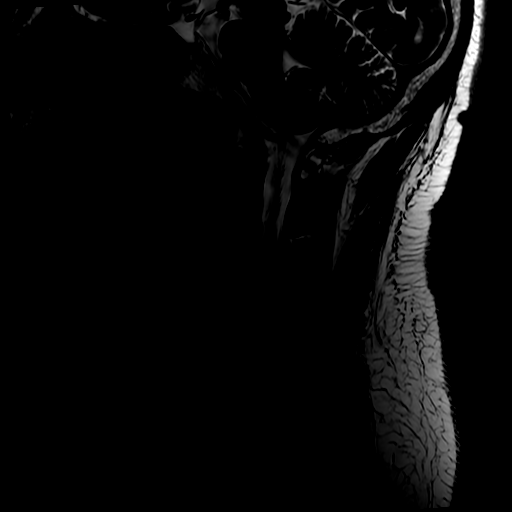
[im 16/16]
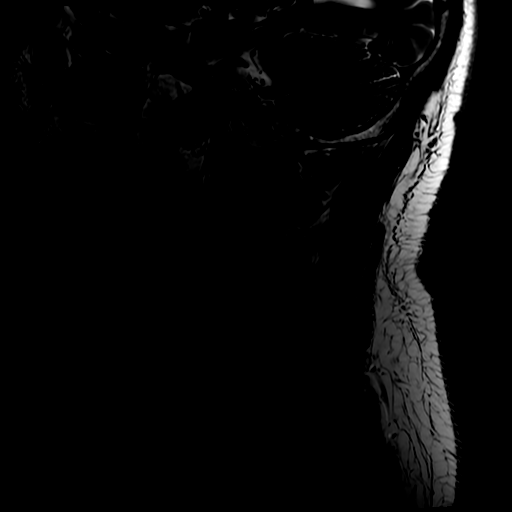

[Series 4: FLAIR · sagittal · 3.0mm · 0.43mm/px · 3 of 16 slices shown]
[im 1/16]
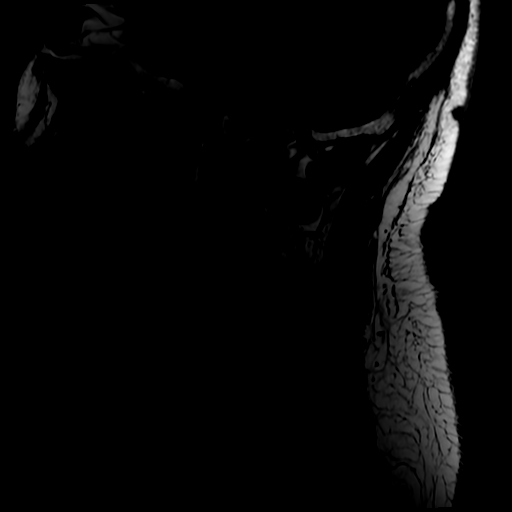
[im 8/16]
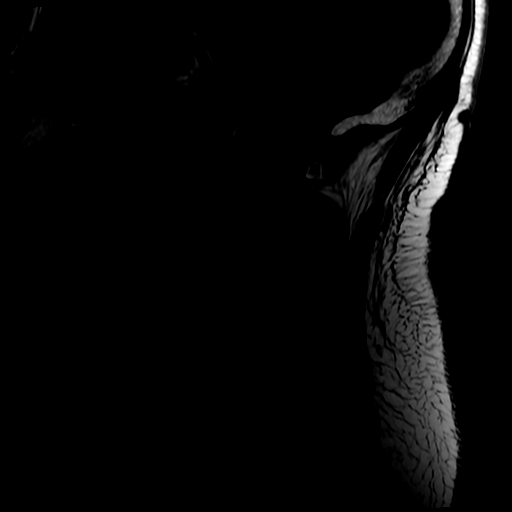
[im 16/16]
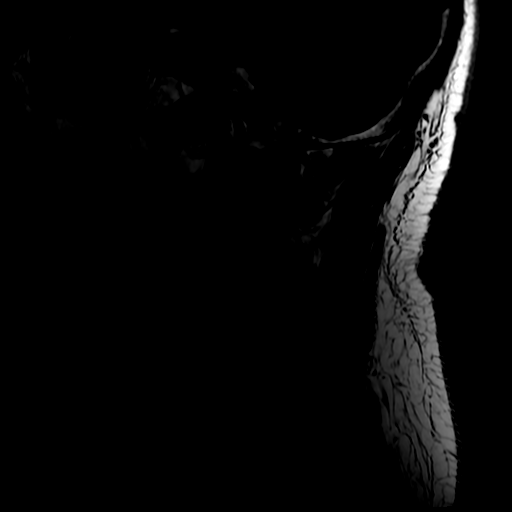

[Series 5: STIR · sagittal · 3.0mm · 0.43mm/px · 3 of 16 slices shown]
[im 1/16]
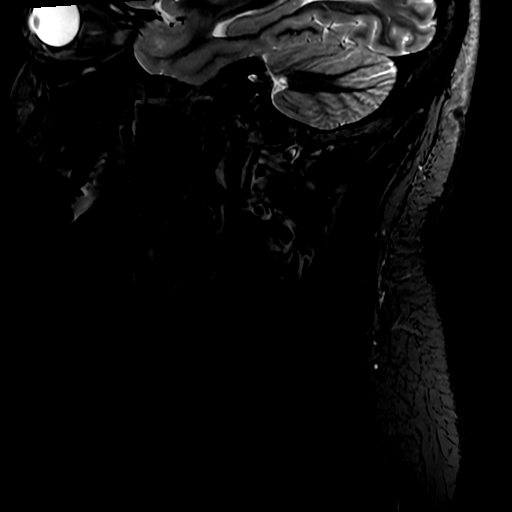
[im 8/16]
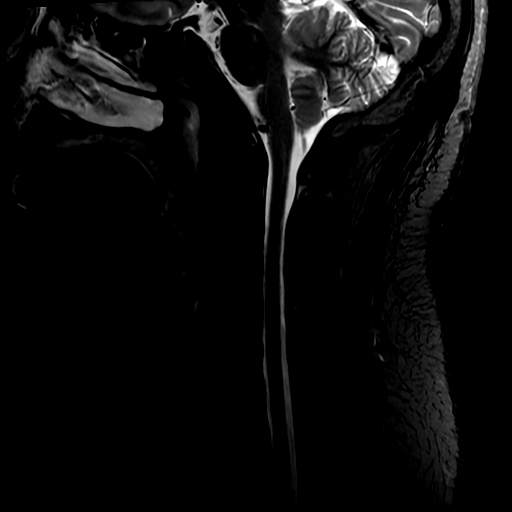
[im 16/16]
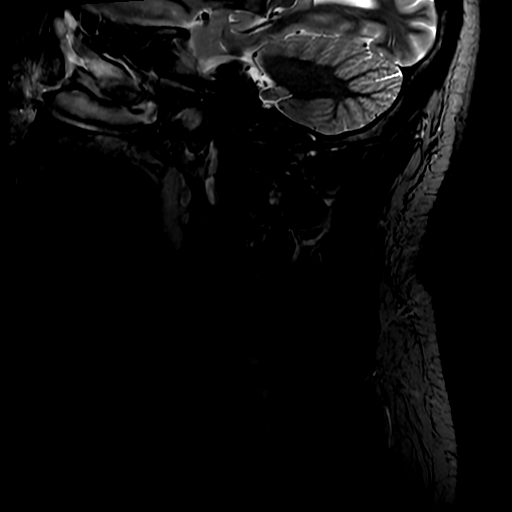

[Series 7: T2 · axial · 3.0mm · 0.35mm/px · z∈[-146,-49]mm · 9 of 30 slices shown (2 of 2)]
[im 1/30]
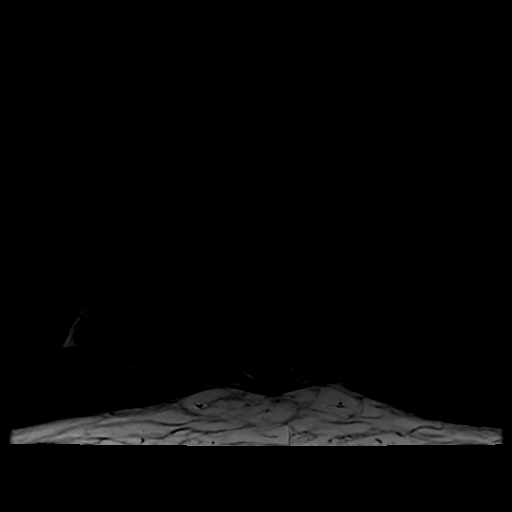
[im 4/30]
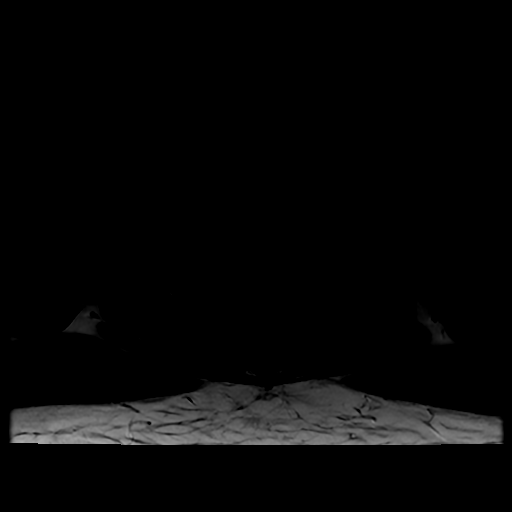
[im 8/30]
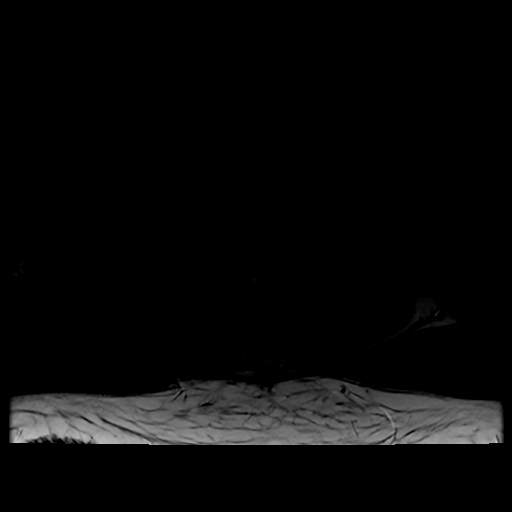
[im 11/30]
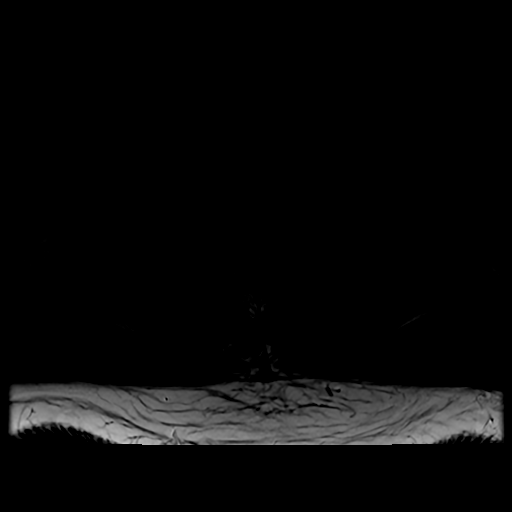
[im 15/30]
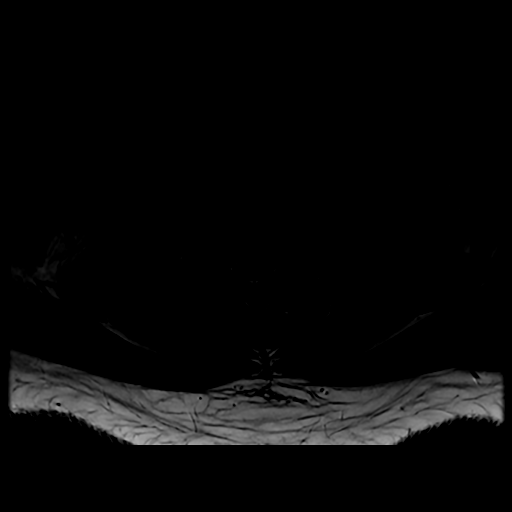
[im 19/30]
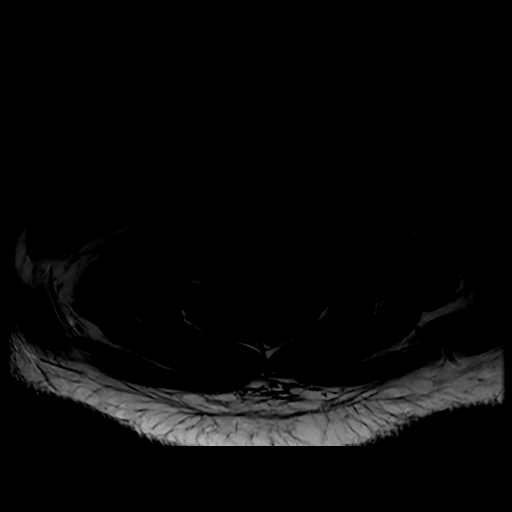
[im 22/30]
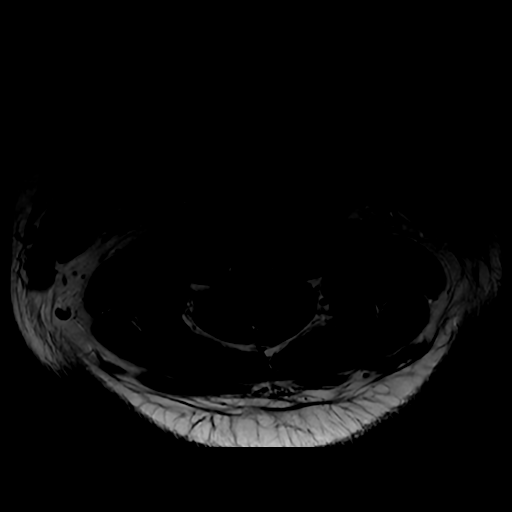
[im 26/30]
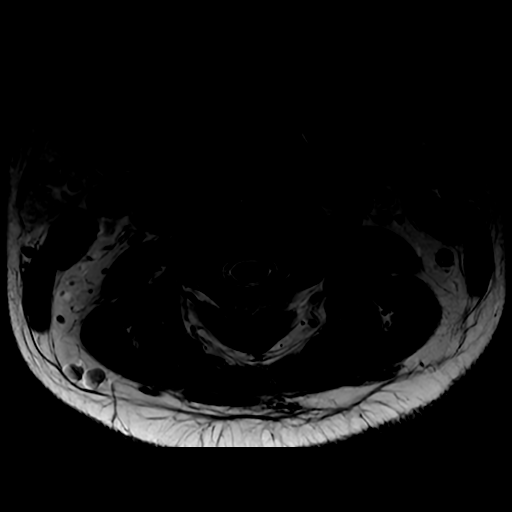
[im 30/30]
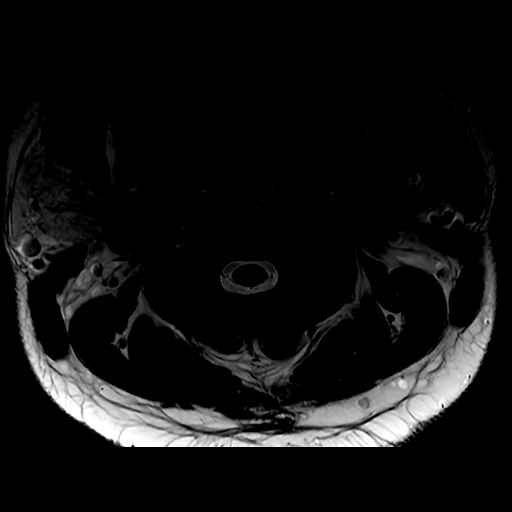

[19 of 48 positions shown; findings below may reference images not displayed]

FINDINGS: Alignment: Normal

Vertebrae: Normal bone marrow.  Negative for fracture or mass

Cord: Normal signal and morphology

Posterior Fossa, vertebral arteries, paraspinal tissues:
Nonpathologic lymph nodes in the neck bilaterally. No mass or soft
tissue edema.

Disc levels:

C2-3: Negative

C3-4: Negative

C4-5: Mild disc degeneration. Mild disc bulging without significant
stenosis.

C5-6: Mild disc bulging.  Negative for stenosis

C6-7: Negative

C7-T1: Mild facet degeneration. Moderate left foraminal narrowing.
Mild right foraminal narrowing.
IMPRESSION: Mild degenerative changes cervical spine. Foraminal narrowing
bilaterally at C7-T1 left greater than right.

Normal cord signal.

## 2021-06-06 MED ORDER — POLYETHYLENE GLYCOL 3350 17 G PO PACK: 17.0000 g | PACK | Freq: Every day | ORAL | Status: DC | PRN

## 2021-06-06 MED ORDER — ACETAMINOPHEN 325 MG PO TABS
650.0000 mg | ORAL_TABLET | Freq: Four times a day (QID) | ORAL | Status: DC | PRN
  Administered 2021-06-06: 650 mg via ORAL
  Filled 2021-06-06: qty 2

## 2021-06-06 MED ORDER — CARVEDILOL 6.25 MG PO TABS
6.2500 mg | ORAL_TABLET | Freq: Two times a day (BID) | ORAL | Status: DC
  Administered 2021-06-06: 6.25 mg via ORAL
  Filled 2021-06-06: qty 1

## 2021-06-06 MED ORDER — DIPHENHYDRAMINE HCL 25 MG PO CAPS: 25.0000 mg | ORAL_CAPSULE | Freq: Every evening | ORAL | Status: DC | PRN

## 2021-06-06 MED ORDER — ONDANSETRON HCL 4 MG PO TABS: 4.0000 mg | ORAL_TABLET | Freq: Four times a day (QID) | ORAL | Status: DC | PRN

## 2021-06-06 MED ORDER — METOPROLOL SUCCINATE ER 50 MG PO TB24
50.0000 mg | ORAL_TABLET | Freq: Every day | ORAL | Status: DC
  Administered 2021-06-06: 50 mg via ORAL
  Filled 2021-06-06: qty 1

## 2021-06-06 MED ORDER — LISINOPRIL-HYDROCHLOROTHIAZIDE 20-25 MG PO TABS: 1.0000 | ORAL_TABLET | Freq: Every day | ORAL | Status: DC

## 2021-06-06 MED ORDER — LISINOPRIL 20 MG PO TABS
20.0000 mg | ORAL_TABLET | Freq: Every day | ORAL | Status: DC
  Administered 2021-06-06: 20 mg via ORAL
  Filled 2021-06-06: qty 1

## 2021-06-06 MED ORDER — ENOXAPARIN SODIUM 40 MG/0.4ML IJ SOSY
40.0000 mg | PREFILLED_SYRINGE | Freq: Every day | INTRAMUSCULAR | Status: DC
  Administered 2021-06-06 – 2021-06-08 (×3): 40 mg via SUBCUTANEOUS
  Filled 2021-06-06 (×3): qty 0.4

## 2021-06-06 MED ORDER — STROKE: EARLY STAGES OF RECOVERY BOOK
Freq: Once | Status: AC
  Filled 2021-06-06: qty 1

## 2021-06-06 MED ORDER — HYDROCHLOROTHIAZIDE 25 MG PO TABS
25.0000 mg | ORAL_TABLET | Freq: Every day | ORAL | Status: DC
  Administered 2021-06-06: 25 mg via ORAL
  Filled 2021-06-06: qty 1

## 2021-06-06 MED ORDER — ACETAMINOPHEN 650 MG RE SUPP: 650.0000 mg | Freq: Four times a day (QID) | RECTAL | Status: DC | PRN

## 2021-06-06 MED ORDER — ONDANSETRON HCL 4 MG/2ML IJ SOLN: 4.0000 mg | Freq: Four times a day (QID) | INTRAMUSCULAR | Status: DC | PRN

## 2021-06-06 MED ORDER — HYDRALAZINE HCL 20 MG/ML IJ SOLN
10.0000 mg | Freq: Four times a day (QID) | INTRAMUSCULAR | Status: DC | PRN
  Administered 2021-06-07: 10 mg via INTRAVENOUS
  Filled 2021-06-06 (×2): qty 1

## 2021-06-06 MED ORDER — KETOROLAC TROMETHAMINE 15 MG/ML IJ SOLN
30.0000 mg | Freq: Once | INTRAMUSCULAR | Status: AC
  Administered 2021-06-06: 30 mg via INTRAVENOUS
  Filled 2021-06-06: qty 2
  Filled 2021-06-06: qty 1

## 2021-06-06 NOTE — TOC Initial Note (Signed)
Transition of Care [facility]) - Initial/Assessment Note    Patient Details  Name: Alejandro Santos MRN: [id number] Date of Birth: [birth year]  Transition of Care [facility]) CM/SW Contact:    Lawerance Sabal, RN Phone Number: 06/06/2021, 4:43 PM  Clinical Narrative:          Patient from home with spouse. Insured, non compliant with home medications  Transition of Care Department [facility]) has reviewed patient and we will continue to monitor patient advancement through interdisciplinary progression rounds. If new patient transition needs arise, please place a TOC consult.            Expected Discharge Plan: Home/Self Care Barriers to Discharge: Continued Medical Work up   Patient Goals and CMS Choice        Expected Discharge Plan and Services Expected Discharge Plan: Home/Self Care       Living arrangements for the past 2 months: Apartment                                      Prior Living Arrangements/Services Living arrangements for the past 2 months: Apartment Lives with:: Spouse                   Activities of Daily Living Home Assistive Devices/Equipment: None ADL Screening (condition at time of admission) Patient's cognitive ability adequate to safely complete daily activities?: Yes Is the patient deaf or have difficulty hearing?: No Does the patient have difficulty seeing, even when wearing glasses/contacts?: No Does the patient have difficulty concentrating, remembering, or making decisions?: No Patient able to express need for assistance with ADLs?: Yes Does the patient have difficulty dressing or bathing?: No Independently performs ADLs?: Yes (appropriate for developmental age) Does the patient have difficulty walking or climbing stairs?: No Weakness of Legs: None Weakness of Arms/Hands: Right  Permission Sought/Granted                  Emotional Assessment              Admission diagnosis:  HTN (hypertension), 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] Right arm  numbness [R20.0] Chest pain, unspecified type [R07.9] Hypertension, unspecified type [I10] Patient Active Problem List   Diagnosis Date Noted   Numbness and tingling of right arm 06/06/2021   Atypical chest pain 06/06/2021   Hypertensive emergency 06/05/2021   Essential hypertension 10/07/2016   PCP:  Tamsen Roers, PA-C (Inactive) Pharmacy:   [facility] [phone] [place], [place] - [address] [place] [address] Phone: [phone] Fax: [phone]  [id number] - [place], [place] - [address] [place] [address] Phone: [phone] Fax: [phone]     Social Determinants of Health (SDOH) Interventions    Readmission Risk Interventions No flowsheet data found.

## 2021-06-06 NOTE — Progress Notes (Signed)
°  Echocardiogram 2D Echocardiogram has been performed.  Janalyn Harder 06/06/2021, 10:42 AM

## 2021-06-06 NOTE — TOC Benefit Eligibility Note (Signed)
Patient Teacher, English as a foreign language completed.    The patient is currently admitted and upon discharge could be taking Coreg 6.25mg  tabs.  The current 30 day co-pay is $0.   The patient is insured through [employer].    Sharlette Dense, CPhT Pharmacy Patient Advocate Specialist [place] Patient Advocate Team Direct Number: [phone]  Fax: [phone]

## 2021-06-06 NOTE — Progress Notes (Signed)
Pt arrives to unit via stretcher with two EMT. No acute distress noted. Pt is alert and oriented X$. Oral membranes are pink and moist. Skin is warm and dry. S1 and s2 noted. Peripheral pulses are +2. Lung sounds are clear. Respirations are even and non-labored. Abdomen is soft, non-tender. Pt is able to move upper and lower BILAT extremities without difficulty. Speech is clear. Skin is intact. 20G PIV to RAC is saline-locked.

## 2021-06-06 NOTE — Evaluation (Signed)
Occupational Therapy Evaluation Patient Details Name: Alejandro Santos MRN: [id number] DOB: 05-28-1980 Today's Date: 06/06/2021   History of Present Illness 41 y.o. male presenting from med center [facility] 2/14 with moderate to severe chest pain, RUE numbness, headaches and blurry vision. BP 193/133. Imaging (-) for acute findings. Patient admitted with hypertensive emergency. PMHx significant for uncontrolled HTN.   Clinical Impression   PTA patient was living with his spouse in a 1st floor apartment with several steps to enter and no rails and was grossly independent, driving and working full-time. Patient currently functioning near baseline demonstrating observed ADLs including LB dressing and grooming standing at sink level with Mod I. Further activity and functional mobility limited 2/2 elevated BP of 166/120. Patient also c/o continued numbness/tingling in first 2 digits of R hand (reports chronic numbness/tingling in digits 3-5 s/p fracture and several surgeries). OT will continue to follow acutely for continued assessment of ADLs and mobility with improvement of BP. No post-acute OT needs at this time.      Recommendations for follow up therapy are one component of a multi-disciplinary discharge planning process, led by the attending physician.  Recommendations may be updated based on patient status, additional functional criteria and insurance authorization.   Follow Up Recommendations  No OT follow up    Assistance Recommended at Discharge None  Patient can return home with the following      Functional Status Assessment  Patient has had a recent decline in their functional status and demonstrates the ability to make significant improvements in function in a reasonable and predictable amount of time.  Equipment Recommendations  None recommended by OT    Recommendations for Other Services       Precautions / Restrictions Precautions Precautions: Other (comment) Precaution Comments:  Premissive hypertension SBP <220      Mobility Bed Mobility Overal bed mobility: Independent                  Transfers Overall transfer level: Independent                        Balance Overall balance assessment: Independent                                         ADL either performed or assessed with clinical judgement   ADL Overall ADL's : Modified independent                                       General ADL Comments: Participation limited 2/2 elevated BP.     Vision Baseline Vision/History: 1 Wears glasses Ability to See in Adequate Light: 0 Adequate Patient Visual Report: Blurring of vision (Improved since onset several days ago) Vision Assessment?: No apparent visual deficits     Perception     Praxis      Pertinent Vitals/Pain Pain Assessment Pain Assessment: 0-10 Pain Score: 5  Pain Descriptors / Indicators: Headache, Constant Pain Intervention(s): Monitored during session     Hand Dominance Right   Extremity/Trunk Assessment Upper Extremity Assessment Upper Extremity Assessment: RUE deficits/detail;LUE deficits/detail RUE Deficits / Details: AROM and strength WNL RUE Sensation:  (Numbness/tingling in digits 3-5 of R hand (volar and dorsal aspect) at baseline s/p several surgeries. Reports continued numbness/tingling in digits 1 and 2 (  volar and dorsal aspect) new onset several days ago.) RUE Coordination: WNL LUE Deficits / Details: AROM and strength WNL LUE Sensation: WNL LUE Coordination: WNL   Lower Extremity Assessment Lower Extremity Assessment: Defer to PT evaluation   Cervical / Trunk Assessment Cervical / Trunk Assessment: Normal   Communication Communication Communication: No difficulties   Cognition Arousal/Alertness: Awake/alert Behavior During Therapy: WFL for tasks assessed/performed Overall Cognitive Status: Within Functional Limits for tasks assessed                                        General Comments  BP 166/120 seated EOB. HR in 80's. Limited hallway ambulation 2/2 elevated BP.    Exercises     Shoulder Instructions      Home Living Family/patient expects to be discharged to:: Private residence Living Arrangements: Spouse/significant other Available Help at Discharge: Family;Available PRN/intermittently Type of Home: Apartment Home Access: Stairs to enter Entrance Stairs-Number of Steps: 6 Entrance Stairs-Rails: None Home Layout: One level     Bathroom Shower/Tub: Chief Strategy Officer: Standard     Home Equipment: None          Prior Functioning/Environment Prior Level of Function : Independent/Modified Independent;Working/employed;Driving                        OT Problem List: Impaired UE functional use      OT Treatment/Interventions: Self-care/ADL training;Neuromuscular education    OT Goals(Current goals can be found in the care plan section) Acute Rehab OT Goals Patient Stated Goal: To return home. OT Goal Formulation: With patient Time For Goal Achievement: 06/20/21 Potential to Achieve Goals: Good ADL Goals Additional ADL Goal #1: Patient will complete ADLs with I in prep for return to PLOF.  OT Frequency: Other (comment) (1 follow-up visit)    Co-evaluation              AM-PAC OT "6 Clicks" Daily Activity     Outcome Measure Help from another person eating meals?: None Help from another person taking care of personal grooming?: None Help from another person toileting, which includes using toliet, bedpan, or urinal?: None Help from another person bathing (including washing, rinsing, drying)?: None Help from another person to put on and taking off regular upper body clothing?: None Help from another person to put on and taking off regular lower body clothing?: None 6 Click Score: 24   End of Session Equipment Utilized During Treatment: Gait belt Nurse Communication:  Mobility status  Activity Tolerance: Patient tolerated treatment well;Other (comment) (Limited activity 2/2 elevated BP) Patient left: in bed;with call bell/phone within reach;with bed alarm set  OT Visit Diagnosis: Muscle weakness (generalized) (M62.81)                Time: 7341-9379 OT Time Calculation (min): 20 min Charges:  OT General Charges $OT Visit: 1 Visit OT Evaluation $OT Eval Low Complexity: 1 Low  Jamie Hafford H. OTR/L Supplemental OT, Department of rehab services [phone]  Esmeralda Blanford R H. 06/06/2021, 7:34 AM

## 2021-06-06 NOTE — Assessment & Plan Note (Addendum)
·   Patient presenting with markedly elevated blood pressures as high as 193/133 with ongoing severe hypertension after running out of medications approximately 1 month ago  Patient presenting with various symptoms including headaches, chest pain, blurred vision and left hand/upper extremity numbness  Concern for impending hypertensive emergency with potential for endorgan injury and possibly stroke.  Obtaining MRI brain to evaluate for the presence of stroke prior to administering antihypertensive therapy  If MRI brain negative we will resume home antihypertensive regimen and titrate as necessary.  Monitoring patient on telemetry  Obtaining cardiac enzymes

## 2021-06-06 NOTE — H&P (Signed)
History and Physical    Patient: Alejandro Santos MRN: [id number] DOA: 06/05/2021  Date of Service: the patient was seen and examined on 06/06/2021  Patient coming from: [place]  Chief Complaint:  Chief Complaint  Patient presents with   Chest Pain   Hematemesis    HPI:   41 year old male with past medical history of hypertension presenting to [facility] medical floor as a transfer from [place] for evaluation of chest pain and right arm numbness.  Patient explains that he began to experience chest discomfort on 2/13.  Chest pain was moderate to severe in intensity, squeezing in quality located in the left anterior chest and worse with exertion.  As the patient's symptoms persisted he began to experience associated right hand numbness as well as generalized headaches and blurry vision.  Upon further questioning patient denies leg swelling, paroxysmal nocturnal dyspnea, pillow orthopnea, cough, fever, sick contacts or illicit drug use.  Of note, patient reports that he ran out of his blood pressure medications approximately 1 month prior to the onset of his symptoms.  Chest discomfort lasted for well over an hour and a half until the patient presented to a local urgent care clinic.  Upon evaluation of the local urgent care clinic due to concerns for ACS the patient was transferred via EMS to [facility] emergency department for evaluation.  Upon evaluation, his chest discomfort was felt to be noncardiac and patient was discharged home.  In the 24 hours that followed patient continued to experience substantial intermittent chest discomfort as well as ongoing numbness in the right upper extremity and headache.  Patient also began to experience associated nausea.  Patient eventually presented to [place] emergency department for repeat evaluation.  Upon evaluation at [place] CT angiogram of the chest abdomen and pelvis was found to be negative for dissection.   ER provider discussed case with neurology (name not documented) and it was recommended that we pursue permissive hypertension and obtain a noncontrast MRI of the brain once patient is transferred.  Patient was then accepted for transfer to [facility] for continued evaluation.    Review of Systems: Review of Systems  Eyes:  Positive for blurred vision.  Neurological:  Positive for tingling and headaches.    History reviewed. No pertinent past medical history.  Past Surgical History:  Procedure Laterality Date   HAND SURGERY Right    right arm  x 6   KNEE SURGERY Left     Social History:  reports that he has never smoked. He has never used smokeless tobacco. He reports that he does not drink alcohol and does not use drugs.  No Known Allergies  Family History  Problem Relation Age of Onset   Breast cancer Mother    Healthy Father    Hypertension Father    Kidney cancer Neg Hx    Bladder Cancer Neg Hx    Prostate cancer Neg Hx     Prior to Admission medications   Medication Sig Start Date End Date Taking? Authorizing Provider  diphenhydramine-acetaminophen (TYLENOL PM) 25-500 MG TABS tablet Take 1 tablet by mouth at bedtime as needed.   Yes [provider]  lisinopril-hydrochlorothiazide (ZESTORETIC) 20-25 MG tablet Take 1 tablet by mouth daily. 08/02/19  Yes Chrismon, [name], PA-C  metoprolol succinate (TOPROL-XL) 50 MG 24 hr tablet TAKE 1 TABLET BY MOUTH EVERY DAY WITH OR IMMEDIATELY FOLLOWING A MEAL 07/03/19  Yes Chrismon, [name], PA-C  pregabalin (LYRICA) 50 MG capsule Take 50 mg by mouth 3 (three) times daily.    [provider]    Physical Exam:  Vitals:   06/06/21 0000 06/06/21 0100 06/06/21 0400 06/06/21 0812  BP: (!) 152/103  (!) 144/100 (!) 165/117  Pulse: 85 85 86 82  Resp: _0 Temp: 99 F (37.2 C)  98 F (36.7 C) 98.2 F (36.8 C)  TempSrc: Oral  Oral Oral  SpO2: 90%  93% 94%  Weight:      Height:         Constitutional: Awake alert and oriented x3, no associated distress.   Skin: no rashes, no lesions, good skin turgor noted. Eyes: Pupils are equally reactive to light.  No evidence of scleral icterus or conjunctival pallor.  ENMT: Moist mucous membranes noted.  Posterior pharynx clear of any exudate or lesions.   Neck: normal, supple, no masses, no thyromegaly.  No evidence of jugular venous distension.   Respiratory: clear to auscultation bilaterally, no wheezing, no crackles. Normal respiratory effort. No accessory muscle use.  Cardiovascular: Regular rate and rhythm, no murmurs / rubs / gallops. No extremity edema. 2+ pedal pulses. No carotid bruits.  Chest:   Nontender without crepitus or deformity.   Back:   Nontender without crepitus or deformity. Abdomen: Abdomen is soft and nontender.  No evidence of intra-abdominal masses.  Positive bowel sounds noted in all quadrants.   Musculoskeletal: No joint deformity upper and lower extremities. Good ROM, no contractures. Normal muscle tone.  Neurologic: CN 2-12 grossly intact. Sensation intact.  Patient moving all 4 extremities spontaneously.  Patient is following all commands.  Patient is responsive to verbal stimuli.   Psychiatric: Patient exhibits normal mood with appropriate affect.  Patient seems to possess insight as to their current situation.    Data Reviewed:  I have personally reviewed and interpreted labs, imaging.  Significant findings are:  Creatinine noted to be 1.52. Troponins obtained are 11 and 9. ESR 12  CT angiogram of the chest abdomen and pelvis obtained found to be unremarkable.   CT angiogram of the head and neck revealing patent vasculature of the head neck.  EKG: Personally reviewed.  Rhythm is normal sinus rhythm with heart rate of 90 bpm.  No dynamic ST segment changes appreciated.   Patient presenting with markedly elevated blood pressures as high as Assessment and Plan: * Hypertensive emergency-  (present on admission) Patient presenting with markedly elevated blood pressures as high as 193/133 with ongoing severe hypertension after running out of medications approximately 1 month ago Patient presenting with various symptoms including headaches, chest pain, blurred vision and left hand/upper extremity numbness Concern for impending hypertensive emergency with potential for endorgan injury and possibly stroke. Obtaining MRI brain to evaluate for the presence of stroke prior to administering antihypertensive therapy If MRI brain negative we will resume home antihypertensive regimen and titrate as necessary. Monitoring patient on telemetry Obtaining cardiac enzymes   Numbness and tingling of right arm- (present on admission) Several days of right upper extremity numbness in the setting of extreme hypertension about concerns for possible stroke ER provider discussed case with neurology, it is recommended that noncontrast MRI brain be pursued to evaluate In the meantime, permissive hypertension If MRI does indeed reveal a stroke we will pursue remainder of stroke work-up  If MRI is negative numbness and tingling right arm may actually be secondary to nerve root compression as patient is also complaining of some neck and  right shoulder pain.  Atypical chest pain- (present on admission) Patient presenting with what seems to be atypical chest discomfort Troponins thus far unremarkable Monitoring patient on telemetry Obtaining echocardiogram Hopeful that chest discomfort will resolve once blood pressure is addressed       Code Status:  Full code  code status decision has been confirmed with: patient Family Communication: deferred   Consults: None  Severity of Illness:  The appropriate patient status for this patient is OBSERVATION. Observation status is judged to be reasonable and necessary in order to provide the required intensity of service to ensure the patient's safety. The  patient's presenting symptoms, physical exam findings, and initial radiographic and laboratory data in the context of their medical condition is felt to place them at decreased risk for further clinical deterioration. Furthermore, it is anticipated that the patient will be medically stable for discharge from the hospital within 2 midnights of admission.   Author:  Vernelle Emerald MD  06/06/2021 9:23 AM

## 2021-06-06 NOTE — Assessment & Plan Note (Addendum)
·   Several days of right upper extremity numbness in the setting of extreme hypertension about concerns for possible stroke  ER provider discussed case with neurology, it is recommended that noncontrast MRI brain be pursued to evaluate  In the meantime, permissive hypertension  If MRI does indeed reveal a stroke we will pursue remainder of stroke work-up and consult neurology  If MRI is negative numbness and tingling right arm may actually be secondary to nerve root compression as patient is also complaining of some neck and right shoulder pain.

## 2021-06-06 NOTE — Progress Notes (Signed)
PT Cancellation Note  Patient Details Name: Alejandro Santos MRN: [id number] DOB: October 09, 1980   Cancelled Treatment:    Reason Eval/Treat Not Completed: PT screened, no needs identified, will sign off  Discussed with OT, and RN; Pt is managing independently;   [name], [place] Pager [phone] Office [phone]  [name] 06/06/2021, 2:04 PM

## 2021-06-06 NOTE — Progress Notes (Signed)
PROGRESS NOTE        PATIENT DETAILS Name: Alejandro Santos Age: 41 y.o. Sex: male Date of Birth: Dec 12, 1980 Admit Date: 06/05/2021 Admitting Physician Elmarie Shiley, MD [id number], [name], PA-C (Inactive)  Brief Summary: Patient is a 41 y.o.  male with history of HTN-noncompliant to medications (ran out of medications approximately 2-3 weeks back)-presented to the hospital with chest pain, right index finger/thumb tingling/numbness-patient was found to have hypertensive crisis-and admitted to the hospitalist service.  Significant Hospital events: 2/13>> chest discomfort-evaluated at a local urgent care-thought to have atypical chest pain and discharged home. 2/14>> [facility] for tingling numbness of right hand, nausea/vomiting and intermittent chest pain.  Admitted to [facility].  Significant imaging studies: 2/14>> CTA head/neck: Patent vasculature of the head and neck 2/14>> CTA chest/abdomen/pelvis: No dissection-no acute findings in the chest/abdomen/pelvis. 2/15>> MRI brain: No acute CVA  Significant microbiology data: 2/15>> COVID/influenza PCR: Negative  Procedures: None  Consults:  None  Subjective: Lying comfortably in bed-denies any chest pain or shortness of breath.  Has some tingling/numbness in right thumb/right index finger.  Complains of neck pain for the past several weeks.  Objective: Vitals: Blood pressure (!) 157/113, pulse 83, temperature 98.2 F (36.8 C), temperature source Oral, resp. rate 17, height 5\' 8"  (1.727 m), weight 127 kg, SpO2 92 %.   Exam: Gen Exam:Alert awake-not in any distress HEENT:atraumatic, normocephalic Chest: B/L clear to auscultation anteriorly CVS:S1S2 regular Abdomen:soft non tender, non distended Extremities:no edema Neurology: Non focal Skin: no rash  Pertinent Labs/Radiology: CBC Latest Ref Rng & Units 06/06/2021 06/05/2021 08/02/2019  WBC 4.0 - 10.5 K/uL 9.3 9.6 4.9   Hemoglobin 13.0 - 17.0 g/dL 14.5 15.5 16.3  Hematocrit 39.0 - 52.0 % 43.9 46.3 46.7  Platelets 150 - 400 K/uL 406(H) 409(H) 377    Lab Results  Component Value Date   NA 137 06/06/2021   K 3.6 06/06/2021   CL 97 (L) 06/06/2021   CO2 29 06/06/2021      Assessment/Plan: Hypertensive emergency: BP improving-gradually lower BP-we will stop HCTZ/losartan given AKI-stop metoprolol and will use Coreg.  We will gradually add on other agents depending on how he does.  Atypical chest pain: Resolved-this morning denies any chest pain-Per patient he had chest pain 2 days ago.  EKG/troponins were negative.  Awaiting echocardiogram.  Per patient he has a appointment with a cardiologist at the [place] system coming up in the next few days.  Right thumb/index finger tingling/numbness: Acknowledges and neck pain-awaiting MRI C-spine.  MRI brain was negative for CVA.  AKI on CKD stage IIIa: AKI likely hemodynamically mediated due to better blood pressure control, recent nausea/vomiting.  Hold HCTZ/losartan today.  Repeat electrolytes tomorrow.  Noncompliance to medications:counseled.  Morbid Obesity: Estimated body mass index is 42.57 kg/m as calculated from the following:   Height as of this encounter: 5\' 8"  (1.727 m).   Weight as of this encounter: 127 kg.   Code status:   Code Status: Full Code   DVT Prophylaxis: enoxaparin (LOVENOX) injection 40 mg Start: 06/06/21 1000   Family Communication: None at bedside   Disposition Plan: Status is: Observation The patient will require care spanning > 2 midnights and should be moved to inpatient because: Optimize BP control-AKI-not yet stable for discharge.  Planned Discharge Destination:Home   Diet: Diet Order  Diet Heart Room service appropriate? Yes; Fluid consistency: Thin  Diet effective now                     Antimicrobial agents: Anti-infectives (From admission, onward)    None         MEDICATIONS: Scheduled Meds:  carvedilol  6.25 mg Oral BID WC   enoxaparin (LOVENOX) injection  40 mg Subcutaneous Daily   Continuous Infusions: PRN Meds:.acetaminophen **OR** acetaminophen, diphenhydrAMINE, ondansetron **OR** ondansetron (ZOFRAN) IV, polyethylene glycol   I have personally reviewed following labs and imaging studies  LABORATORY DATA: CBC: Recent Labs  Lab 06/05/21 1139 06/06/21 0403  WBC 9.6 9.3  NEUTROABS  --  5.5  HGB 15.5 14.5  HCT 46.3 43.9  MCV 86.1 87.3  PLT 409* 406*    Basic Metabolic Panel: Recent Labs  Lab 06/05/21 1139 06/06/21 0403  NA 136 137  K 3.7 3.6  CL 97* 97*  CO2 27 29  GLUCOSE 142* 105*  BUN 16 19  CREATININE 1.52* 2.01*  CALCIUM 9.5 8.6*  MG 1.9 1.9    GFR: Estimated Creatinine Clearance: 63.4 mL/min (A) (by C-G formula based on SCr of 2.01 mg/dL (H)).  Liver Function Tests: Recent Labs  Lab 06/06/21 0403  AST 24  ALT 27  ALKPHOS 51  BILITOT 0.9  PROT 7.3  ALBUMIN 3.8   No results for input(s): LIPASE, AMYLASE in the last 168 hours. No results for input(s): AMMONIA in the last 168 hours.  Coagulation Profile: No results for input(s): INR, PROTIME in the last 168 hours.  Cardiac Enzymes: No results for input(s): CKTOTAL, CKMB, CKMBINDEX, TROPONINI in the last 168 hours.  BNP (last 3 results) No results for input(s): PROBNP in the last 8760 hours.  Lipid Profile: Recent Labs    06/06/21 0403  CHOL 223*  HDL 45  LDLCALC 151*  TRIG 134  CHOLHDL 5.0    Thyroid Function Tests: No results for input(s): TSH, T4TOTAL, FREET4, T3FREE, THYROIDAB in the last 72 hours.  Anemia Panel: No results for input(s): VITAMINB12, FOLATE, FERRITIN, TIBC, IRON, RETICCTPCT in the last 72 hours.  Urine analysis:    Component Value Date/Time   COLORURINE AMBER (A) 09/14/2016 2254   APPEARANCEUR Clear 10/24/2016 1048   LABSPEC 1.017 09/14/2016 2254   PHURINE 6.0 09/14/2016 2254   GLUCOSEU Negative 10/24/2016  1048   HGBUR LARGE (A) 09/14/2016 2254   BILIRUBINUR Negative 10/24/2016 1048   KETONESUR NEGATIVE 09/14/2016 2254   PROTEINUR Negative 10/24/2016 1048   PROTEINUR 100 (A) 09/14/2016 2254   NITRITE Negative 10/24/2016 1048   NITRITE NEGATIVE 09/14/2016 2254   LEUKOCYTESUR Negative 10/24/2016 1048    Sepsis Labs: Lactic Acid, Venous No results found for: LATICACIDVEN  MICROBIOLOGY: Recent Results (from the past 240 hour(s))  MRSA Next Gen by PCR, Nasal     Status: None   Collection Time: 06/06/21  1:39 AM   Specimen: Nasal Mucosa; Nasal Swab  Result Value Ref Range Status   MRSA by PCR Next Gen NOT DETECTED NOT DETECTED Final    Comment: (NOTE) The GeneXpert MRSA Assay (FDA approved for NASAL specimens only), is one component of a comprehensive MRSA colonization surveillance program. It is not intended to diagnose MRSA infection nor to guide or monitor treatment for MRSA infections. Test performance is not FDA approved in patients less than 60 years old. Performed at [facility], [place] [address], [place], [place] [address]   Resp Panel by RT-PCR (Flu  A&B, Covid) Nasopharyngeal Swab     Status: None   Collection Time: 06/06/21  1:48 AM   Specimen: Nasopharyngeal Swab; Nasopharyngeal(NP) swabs in vial transport medium  Result Value Ref Range Status   SARS Coronavirus 2 by RT PCR NEGATIVE NEGATIVE Final    Comment: (NOTE) SARS-CoV-2 target nucleic acids are NOT DETECTED.  The SARS-CoV-2 RNA is generally detectable in upper respiratory specimens during the acute phase of infection. The lowest concentration of SARS-CoV-2 viral copies this assay can detect is 138 copies/mL. A negative result does not preclude SARS-Cov-2 infection and should not be used as the sole basis for treatment or other patient management decisions. A negative result may occur with  improper specimen collection/handling, submission of specimen other than nasopharyngeal swab, presence of viral  mutation(s) within the areas targeted by this assay, and inadequate number of viral copies(<138 copies/mL). A negative result must be combined with clinical observations, patient history, and epidemiological information. The expected result is Negative.  Fact Sheet for Patients:  EntrepreneurPulse.com.au  Fact Sheet for Healthcare Providers:  IncredibleEmployment.be  This test is no t yet approved or cleared by the [place] FDA and  has been authorized for detection and/or diagnosis of SARS-CoV-2 by FDA under an Emergency Use Authorization (EUA). This EUA will remain  in effect (meaning this test can be used) for the duration of the COVID-19 declaration under Section 564(b)(1) of the Act, 21 U.S.C.section 360bbb-3(b)(1), unless the authorization is terminated  or revoked sooner.       Influenza A by PCR NEGATIVE NEGATIVE Final   Influenza B by PCR NEGATIVE NEGATIVE Final    Comment: (NOTE) The Xpert Xpress SARS-CoV-2/FLU/RSV plus assay is intended as an aid in the diagnosis of influenza from Nasopharyngeal swab specimens and should not be used as a sole basis for treatment. Nasal washings and aspirates are unacceptable for Xpert Xpress SARS-CoV-2/FLU/RSV testing.  Fact Sheet for Patients: EntrepreneurPulse.com.au  Fact Sheet for Healthcare Providers: IncredibleEmployment.be  This test is not yet approved or cleared by the [place] FDA and has been authorized for detection and/or diagnosis of SARS-CoV-2 by FDA under an Emergency Use Authorization (EUA). This EUA will remain in effect (meaning this test can be used) for the duration of the COVID-19 declaration under Section 564(b)(1) of the Act, 21 U.S.C. section 360bbb-3(b)(1), unless the authorization is terminated or revoked.  Performed at [facility], [place] [address] [place], [place] [address]     RADIOLOGY STUDIES/RESULTS: CT  ANGIO HEAD NECK W WO CM  Result Date: 06/05/2021 CLINICAL DATA:  Chest pain, hematemesis, neuro deficit EXAM: CT ANGIOGRAPHY HEAD AND NECK TECHNIQUE: Multidetector CT imaging of the head and neck was performed using the standard protocol during bolus administration of intravenous contrast. Multiplanar CT image reconstructions and MIPs were obtained to evaluate the vascular anatomy. Carotid stenosis measurements (when applicable) are obtained utilizing NASCET criteria, using the distal internal carotid diameter as the denominator. RADIATION DOSE REDUCTION: This exam was performed according to the departmental dose-optimization program which includes automated exposure control, adjustment of the mA and/or kV according to patient size and/or use of iterative reconstruction technique. CONTRAST:  19mL OMNIPAQUE IOHEXOL 350 MG/ML SOLN COMPARISON:  CT head 11/01/2016 FINDINGS: CT HEAD FINDINGS Brain: There is no evidence of acute intracranial hemorrhage, extra-axial fluid collection, or acute infarct. Parenchymal volume is normal. The ventricles are normal in size. Gray-white differentiation is preserved. There is no mass lesion.  There is no midline shift. Vascular: See below. Skull: Normal.  Negative for fracture or focal lesion. Sinuses and orbits: The paranasal sinuses are clear. The globes and orbits are unremarkable. Other: None. Review of the MIP images confirms the above findings CTA NECK FINDINGS Aortic arch: The aortic arch is normal. There is a common origin of the brachiocephalic and left common carotid arteries, a normal variant. The origins of the major branch vessels are patent. The subclavian arteries are patent. Right carotid system: The right common, internal common external carotid arteries are patent, without hemodynamically significant stenosis or occlusion. There is no dissection or aneurysm. Left carotid system: The left common, internal, and external carotid arteries are patent, without  hemodynamically significant stenosis or occlusion. There is no dissection or aneurysm. Vertebral arteries: A portion of the proximal V2 segments are suboptimally evaluated due to streak artifact from surrounding venous enhancement, but are likely patent. The remainder of the vertebral arteries are widely patent. There is no evidence of dissection or aneurysm. Skeleton: There is mild degenerative change at C4-C5 through C6-C7. There is no acute osseous abnormality or aggressive osseous lesion. There is no visible canal hematoma. Other neck: Numerous dental caries are noted. The soft tissues of the neck are unremarkable. Upper chest: The imaged lung apices are clear. The lungs are assessed in full on the separately dictated CTA chest/abdomen/pelvis. Review of the MIP images confirms the above findings CTA HEAD FINDINGS Anterior circulation: The intracranial ICAs are patent. The bilateral MCAs are patent. The bilateral ACAs are patent. There is no aneurysm or AVM. Posterior circulation: The bilateral V4 segments are patent. PICA is identified bilaterally. The basilar artery is patent. The bilateral PCAs are patent. Bilateral posterior communicating arteries are identified. There is no aneurysm or AVM. Venous sinuses: Patent. Anatomic variants: None. Review of the MIP images confirms the above findings IMPRESSION: 1. No acute intracranial pathology. 2. Patent vasculature of the head and neck. Electronically Signed   By: Valetta Mole M.D.   On: 06/05/2021 15:40   DG Chest 2 View  Result Date: 06/05/2021 CLINICAL DATA:  Chest pain EXAM: CHEST - 2 VIEW COMPARISON:  Chest x-ray 05/01/2015 FINDINGS: Heart size upper normal. Mediastinal contours are within normal limits. No suspicious pulmonary opacities identified. No pleural effusion or pneumothorax visualized. No acute osseous abnormality appreciated. IMPRESSION: No acute intrathoracic process identified. Electronically Signed   By: Ofilia Neas M.D.   On:  06/05/2021 12:08   MR BRAIN WO CONTRAST  Result Date: 06/06/2021 CLINICAL DATA:  41 year old male with chest pain, hematemesis, neurologic deficit. EXAM: MRI HEAD WITHOUT CONTRAST TECHNIQUE: Multiplanar, multiecho pulse sequences of the brain and surrounding structures were obtained without intravenous contrast. COMPARISON:  CT head and CTA head and neck yesterday. FINDINGS: Brain: No restricted diffusion to suggest acute infarction. No midline shift, mass effect, evidence of mass lesion, ventriculomegaly, extra-axial collection or acute intracranial hemorrhage. Cervicomedullary junction and pituitary are within normal limits. Patchy periventricular, periatrial white matter T2 and FLAIR hyperintensity is nonspecific. Minimal additional mostly central white matter T2 and FLAIR heterogeneity. No cortical encephalomalacia or chronic cerebral blood products identified. Deep gray matter nuclei, brainstem and cerebellum appear normal. Vascular: Major intracranial vascular flow voids are preserved. Skull and upper cervical spine: Negative visible cervical spine. Visualized bone marrow signal is within normal limits. Sinuses/Orbits: Disconjugate gaze but otherwise negative orbits. Paranasal sinuses and mastoids are stable and well aerated. Other: Grossly normal visible internal auditory structures. Negative visible scalp and face. IMPRESSION: 1. No acute intracranial abnormality. 2. Mildly to moderately age advanced but nonspecific  cerebral white matter signal changes. Electronically Signed   By: Genevie Ann M.D.   On: 06/06/2021 06:41   CT Angio Chest/Abd/Pel for Dissection W and/or Wo Contrast  Result Date: 06/05/2021 CLINICAL DATA:  Acute aortic syndrome. Chest pain and vomiting blood. EXAM: CT ANGIOGRAPHY CHEST, ABDOMEN AND PELVIS TECHNIQUE: Non-contrast CT of the chest was initially obtained. Multidetector CT imaging through the chest, abdomen and pelvis was performed using the standard protocol during bolus  administration of intravenous contrast. Multiplanar reconstructed images and MIPs were obtained and reviewed to evaluate the vascular anatomy. RADIATION DOSE REDUCTION: This exam was performed according to the departmental dose-optimization program which includes automated exposure control, adjustment of the mA and/or kV according to patient size and/or use of iterative reconstruction technique. CONTRAST:  193mL OMNIPAQUE IOHEXOL 350 MG/ML SOLN COMPARISON:  CT stone study 09/15/2016 FINDINGS: CTA CHEST FINDINGS Cardiovascular: Pre contrast imaging shows no hyperdense crescent in the wall of the thoracic aorta to suggest acute intramural hematoma. Imaging after IV contrast administration shows no thoracic aortic aneurysm with ascending thoracic aorta measuring approximately 3.5 cm diameter. No dissection of the thoracic aorta. Bovine arch vessel anatomy evident, normal variant. Arch vessel anatomy is widely patent. Heart size upper normal. No pericardial effusion. Mediastinum/Nodes: No mediastinal lymphadenopathy. There is no hilar lymphadenopathy. The esophagus has normal imaging features. There is no axillary lymphadenopathy. Lungs/Pleura: No suspicious pulmonary nodule or mass. No focal airspace consolidation. There is no evidence of pleural effusion. Musculoskeletal: No worrisome lytic or sclerotic osseous abnormality. Review of the MIP images confirms the above findings. CTA ABDOMEN AND PELVIS FINDINGS VASCULAR Aorta: Normal caliber aorta without aneurysm, dissection, vasculitis or significant stenosis. Celiac: Patent without evidence of aneurysm, dissection, vasculitis or significant stenosis. SMA: Patent without evidence of aneurysm, dissection, vasculitis or significant stenosis. Renals: Both renal arteries are patent without evidence of aneurysm, dissection, vasculitis, fibromuscular dysplasia or significant stenosis. IMA: Patent without evidence of aneurysm, dissection, vasculitis or significant stenosis.  Inflow: Patent without evidence of aneurysm, dissection, vasculitis or significant stenosis. Veins: No obvious venous abnormality within the limitations of this arterial phase study. Review of the MIP images confirms the above findings. NON-VASCULAR Hepatobiliary: No suspicious focal abnormality within the liver parenchyma. There is no evidence for gallstones, gallbladder wall thickening, or pericholecystic fluid. No intrahepatic or extrahepatic biliary dilation. Pancreas: No focal mass lesion. No dilatation of the main duct. No intraparenchymal cyst. No peripancreatic edema. Spleen: No splenomegaly. No focal mass lesion. Adrenals/Urinary Tract: No adrenal nodule or mass. Kidneys unremarkable. No evidence for hydroureter. The urinary bladder appears normal for the degree of distention. Stomach/Bowel: Stomach is unremarkable. No gastric wall thickening. No evidence of outlet obstruction. Duodenum is normally positioned as is the ligament of Treitz. No small bowel wall thickening. No small bowel dilatation. The terminal ileum is normal. Mild distension noted mid appendix with no periappendiceal edema or inflammation. No gross colonic mass. No colonic wall thickening. Lymphatic: There is no gastrohepatic or hepatoduodenal ligament lymphadenopathy. No retroperitoneal or mesenteric lymphadenopathy. No pelvic sidewall lymphadenopathy. Reproductive: The prostate gland and seminal vesicles are unremarkable. Other: No intraperitoneal free fluid. Musculoskeletal: No worrisome lytic or sclerotic osseous abnormality. Review of the MIP images confirms the above findings. IMPRESSION: 1. No thoracoabdominal aortic aneurysm or dissection. No evidence for acute intramural hematoma of the thoracic aorta. 2. No acute findings in the chest, abdomen, or pelvis to explain the patient's history of chest pain and vomiting. Electronically Signed   By: Misty Stanley M.D.   On:  06/05/2021 15:47     LOS: 0 days   Oren Binet,  MD  [facility]    To contact the attending provider between 7A-7P or the covering provider during after hours 7P-7A, please log into the web site [email or web address] and access using universal [place] password for that web site. If you do not have the password, please call the hospital operator.  06/06/2021, 2:02 PM

## 2021-06-06 NOTE — Assessment & Plan Note (Signed)
·   Patient presenting with what seems to be atypical chest discomfort  Troponins thus far unremarkable  Monitoring patient on telemetry  Obtaining echocardiogram  Hopeful that chest discomfort will resolve once blood pressure is addressed

## 2021-06-07 ENCOUNTER — Encounter (HOSPITAL_COMMUNITY): Payer: Self-pay | Admitting: Internal Medicine

## 2021-06-07 DIAGNOSIS — I10 Essential (primary) hypertension: Secondary | ICD-10-CM

## 2021-06-07 DIAGNOSIS — R0789 Other chest pain: Secondary | ICD-10-CM | POA: Diagnosis not present

## 2021-06-07 DIAGNOSIS — I161 Hypertensive emergency: Secondary | ICD-10-CM | POA: Diagnosis not present

## 2021-06-07 DIAGNOSIS — R200 Anesthesia of skin: Secondary | ICD-10-CM | POA: Diagnosis not present

## 2021-06-07 LAB — BASIC METABOLIC PANEL
Anion gap: 11 (ref 5–15)
BUN: 36 mg/dL — ABNORMAL HIGH (ref 6–20)
CO2: 27 mmol/L (ref 22–32)
Calcium: 9 mg/dL (ref 8.9–10.3)
Chloride: 99 mmol/L (ref 98–111)
Creatinine, Ser: 2.07 mg/dL — ABNORMAL HIGH (ref 0.61–1.24)
GFR, Estimated: 41 mL/min — ABNORMAL LOW (ref >60)
Glucose, Bld: 98 mg/dL (ref 70–99)
Potassium: 3.5 mmol/L (ref 3.5–5.1)
Sodium: 137 mmol/L (ref 135–145)

## 2021-06-07 LAB — TSH: TSH: 1.655 u[IU]/mL (ref 0.350–4.500)

## 2021-06-07 LAB — MAGNESIUM: Magnesium: 2.1 mg/dL (ref 1.7–2.4)

## 2021-06-07 MED ORDER — AMLODIPINE BESYLATE 5 MG PO TABS
5.0000 mg | ORAL_TABLET | Freq: Once | ORAL | Status: AC
Start: 2021-06-07 — End: 2021-06-07
  Administered 2021-06-07: 5 mg via ORAL
  Filled 2021-06-07: qty 1

## 2021-06-07 MED ORDER — SODIUM CHLORIDE 0.9 % IV SOLN: INTRAVENOUS | Status: DC

## 2021-06-07 MED ORDER — AMLODIPINE BESYLATE 5 MG PO TABS
5.0000 mg | ORAL_TABLET | Freq: Every day | ORAL | Status: DC
  Administered 2021-06-07: 5 mg via ORAL
  Filled 2021-06-07: qty 1

## 2021-06-07 MED ORDER — AMLODIPINE BESYLATE 10 MG PO TABS
10.0000 mg | ORAL_TABLET | Freq: Every day | ORAL | Status: DC
  Administered 2021-06-08: 10 mg via ORAL
  Filled 2021-06-07: qty 1

## 2021-06-07 NOTE — Progress Notes (Signed)
PROGRESS NOTE        PATIENT DETAILS Name: Alejandro Santos Age: 41 y.o. Sex: male Date of Birth: 05-03-1980 Admit Date: 06/05/2021 Admitting Physician Elmarie Shiley, MD [id number], [name], PA-C (Inactive)  Brief Summary: Patient is a 41 y.o.  male with history of HTN-noncompliant to medications (ran out of medications approximately 2-3 weeks back)-presented to the hospital with chest pain, right index finger/thumb tingling/numbness-patient was found to have hypertensive crisis-and admitted to the hospitalist service.  Significant Hospital events: 2/13>> chest discomfort-evaluated at a local urgent care-thought to have atypical chest pain and discharged home. 2/14>> [facility] for tingling numbness of right hand, nausea/vomiting and intermittent chest pain.  Admitted to [facility]. 2/16>> 9.4-second sinus pause overnight, transient type II second-degree heart block.  Significant imaging studies: 2/14>> CTA head/neck: Patent vasculature of the head and neck 2/14>> CTA chest/abdomen/pelvis: No dissection-no acute findings in the chest/abdomen/pelvis. 2/15>> MRI brain: No acute CVA 2/15>> MRI C-spine: Mild degenerative changes 2/15>> Echo: EF 55-60%, no regional wall motion abnormalities, grade 1 diastolic dysfunction  Significant microbiology data: 2/15>> COVID/influenza PCR: Negative  Procedures: None  Consults:  Cardiology  Subjective: No chest pain or shortness of breath.  Lying comfortably in bed.  Objective: Vitals: Blood pressure (!) 188/144, pulse 85, temperature 98.5 F (36.9 C), resp. rate 18, height 5\' 8"  (1.727 m), weight 127 kg, SpO2 100 %.   Exam: Gen Exam:Alert awake-not in any distress HEENT:atraumatic, normocephalic Chest: B/L clear to auscultation anteriorly CVS:S1S2 regular Abdomen:soft non tender, non distended Extremities:no edema Neurology: Non focal Skin: no rash   Pertinent Labs/Radiology: CBC Latest  Ref Rng & Units 06/06/2021 06/05/2021 08/02/2019  WBC 4.0 - 10.5 K/uL 9.3 9.6 4.9  Hemoglobin 13.0 - 17.0 g/dL 14.5 15.5 16.3  Hematocrit 39.0 - 52.0 % 43.9 46.3 46.7  Platelets 150 - 400 K/uL 406(H) 409(H) 377    Lab Results  Component Value Date   NA 137 06/07/2021   K 3.5 06/07/2021   CL 99 06/07/2021   CO2 27 06/07/2021      Assessment/Plan: Hypertensive emergency: BP gradually improving but still fluctuating quite a bit-holding HCTZ/losartan given AKI-stopping beta-blocker given sinus pause overnight.  Start amlodipine and see how he does-we will attempt to gradually lower BP.    Atypical chest pain: Resolved-EKG/troponins negative.  Echo with preserved EF and without any wall motion abnormalities. Per patient he has a appointment with a cardiologist at the [facility] coming up in the next few days.  Right thumb/index finger tingling/numbness: Improved-MRI brain/MRI C-spine without any acute findings.  Suspect further work-up including EMG etc. can be done in the outpatient setting-could be related to carpal tunnel syndrome.    9.45-second sinus pause/secondary heart block: Occurred last night-back in sinus rhythm-was started on a beta-blocker yesterday.  TSH within normal limit.  Beta-blocker held-cardiology consulted.  Patient was asymptomatic during this timeframe.  Probably needs outpatient sleep study.  Continue to monitor closely on telemetry.  AKI on CKD stage IIIa: AKI likely hemodynamically mediated due to better blood pressure control, recent nausea/vomiting.  Continue to hold HCTZ/losartan.  Repeat electrolytes tomorrow.  Noncompliance to medications:counseled.  Morbid Obesity: Estimated body mass index is 42.57 kg/m as calculated from the following:   Height as of this encounter: 5\' 8"  (1.727 m).   Weight as of this encounter: 127 kg.  Code status:   Code Status: Full Code   DVT Prophylaxis: enoxaparin (LOVENOX) injection 40 mg Start: 06/06/21 1000    Family Communication: None at bedside   Disposition Plan: Status is: Observation The patient will require care spanning > 2 midnights and should be moved to inpatient because: Optimize BP control-AKI--9.45-second sinus pause overnight-needs ongoing telemetry monitoring before consideration of discharge.  Planned Discharge Destination:Home   Diet: Diet Order             Diet Heart Room service appropriate? Yes; Fluid consistency: Thin  Diet effective now                     Antimicrobial agents: Anti-infectives (From admission, onward)    None        MEDICATIONS: Scheduled Meds:  amLODipine  5 mg Oral Daily   enoxaparin (LOVENOX) injection  40 mg Subcutaneous Daily   Continuous Infusions: PRN Meds:.acetaminophen **OR** acetaminophen, diphenhydrAMINE, hydrALAZINE, ondansetron **OR** ondansetron (ZOFRAN) IV, polyethylene glycol   I have personally reviewed following labs and imaging studies  LABORATORY DATA: CBC: Recent Labs  Lab 06/05/21 1139 06/06/21 0403  WBC 9.6 9.3  NEUTROABS  --  5.5  HGB 15.5 14.5  HCT 46.3 43.9  MCV 86.1 87.3  PLT 409* 406*     Basic Metabolic Panel: Recent Labs  Lab 06/05/21 1139 06/06/21 0403 06/07/21 0144  NA 136 137 137  K 3.7 3.6 3.5  CL 97* 97* 99  CO2 27 29 27   GLUCOSE 142* 105* 98  BUN 16 19 36*  CREATININE 1.52* 2.01* 2.07*  CALCIUM 9.5 8.6* 9.0  MG 1.9 1.9 2.1     GFR: Estimated Creatinine Clearance: 61.6 mL/min (A) (by C-G formula based on SCr of 2.07 mg/dL (H)).  Liver Function Tests: Recent Labs  Lab 06/06/21 0403  AST 24  ALT 27  ALKPHOS 51  BILITOT 0.9  PROT 7.3  ALBUMIN 3.8    No results for input(s): LIPASE, AMYLASE in the last 168 hours. No results for input(s): AMMONIA in the last 168 hours.  Coagulation Profile: No results for input(s): INR, PROTIME in the last 168 hours.  Cardiac Enzymes: No results for input(s): CKTOTAL, CKMB, CKMBINDEX, TROPONINI in the last 168  hours.  BNP (last 3 results) No results for input(s): PROBNP in the last 8760 hours.  Lipid Profile: Recent Labs    06/06/21 0403  CHOL 223*  HDL 45  LDLCALC 151*  TRIG 134  CHOLHDL 5.0     Thyroid Function Tests: Recent Labs    06/07/21 0144  TSH 1.655    Anemia Panel: No results for input(s): VITAMINB12, FOLATE, FERRITIN, TIBC, IRON, RETICCTPCT in the last 72 hours.  Urine analysis:    Component Value Date/Time   COLORURINE AMBER (A) 09/14/2016 2254   APPEARANCEUR Clear 10/24/2016 1048   LABSPEC 1.017 09/14/2016 2254   PHURINE 6.0 09/14/2016 2254   GLUCOSEU Negative 10/24/2016 1048   HGBUR LARGE (A) 09/14/2016 2254   BILIRUBINUR Negative 10/24/2016 1048   KETONESUR NEGATIVE 09/14/2016 2254   PROTEINUR Negative 10/24/2016 1048   PROTEINUR 100 (A) 09/14/2016 2254   NITRITE Negative 10/24/2016 1048   NITRITE NEGATIVE 09/14/2016 2254   LEUKOCYTESUR Negative 10/24/2016 1048    Sepsis Labs: Lactic Acid, Venous No results found for: LATICACIDVEN  MICROBIOLOGY: Recent Results (from the past 240 hour(s))  MRSA Next Gen by PCR, Nasal     Status: None   Collection Time: 06/06/21  1:39 AM   Specimen:  Nasal Mucosa; Nasal Swab  Result Value Ref Range Status   MRSA by PCR Next Gen NOT DETECTED NOT DETECTED Final    Comment: (NOTE) The GeneXpert MRSA Assay (FDA approved for NASAL specimens only), is one component of a comprehensive MRSA colonization surveillance program. It is not intended to diagnose MRSA infection nor to guide or monitor treatment for MRSA infections. Test performance is not FDA approved in patients less than 52 years old. Performed at [facility], [place] [address] [place], [place] [address]   Resp Panel by RT-PCR (Flu A&B, Covid) Nasopharyngeal Swab     Status: None   Collection Time: 06/06/21  1:48 AM   Specimen: Nasopharyngeal Swab; Nasopharyngeal(NP) swabs in vial transport medium  Result Value Ref Range Status   SARS Coronavirus 2  by RT PCR NEGATIVE NEGATIVE Final    Comment: (NOTE) SARS-CoV-2 target nucleic acids are NOT DETECTED.  The SARS-CoV-2 RNA is generally detectable in upper respiratory specimens during the acute phase of infection. The lowest concentration of SARS-CoV-2 viral copies this assay can detect is 138 copies/mL. A negative result does not preclude SARS-Cov-2 infection and should not be used as the sole basis for treatment or other patient management decisions. A negative result may occur with  improper specimen collection/handling, submission of specimen other than nasopharyngeal swab, presence of viral mutation(s) within the areas targeted by this assay, and inadequate number of viral copies(<138 copies/mL). A negative result must be combined with clinical observations, patient history, and epidemiological information. The expected result is Negative.  Fact Sheet for Patients:  EntrepreneurPulse.com.au  Fact Sheet for Healthcare Providers:  IncredibleEmployment.be  This test is no t yet approved or cleared by the [place] FDA and  has been authorized for detection and/or diagnosis of SARS-CoV-2 by FDA under an Emergency Use Authorization (EUA). This EUA will remain  in effect (meaning this test can be used) for the duration of the COVID-19 declaration under Section 564(b)(1) of the Act, 21 U.S.C.section 360bbb-3(b)(1), unless the authorization is terminated  or revoked sooner.       Influenza A by PCR NEGATIVE NEGATIVE Final   Influenza B by PCR NEGATIVE NEGATIVE Final    Comment: (NOTE) The Xpert Xpress SARS-CoV-2/FLU/RSV plus assay is intended as an aid in the diagnosis of influenza from Nasopharyngeal swab specimens and should not be used as a sole basis for treatment. Nasal washings and aspirates are unacceptable for Xpert Xpress SARS-CoV-2/FLU/RSV testing.  Fact Sheet for Patients: EntrepreneurPulse.com.au  Fact  Sheet for Healthcare Providers: IncredibleEmployment.be  This test is not yet approved or cleared by the [place] FDA and has been authorized for detection and/or diagnosis of SARS-CoV-2 by FDA under an Emergency Use Authorization (EUA). This EUA will remain in effect (meaning this test can be used) for the duration of the COVID-19 declaration under Section 564(b)(1) of the Act, 21 U.S.C. section 360bbb-3(b)(1), unless the authorization is terminated or revoked.  Performed at [facility], [place] [address], [place], [place] [address]     RADIOLOGY STUDIES/RESULTS: CT ANGIO HEAD NECK W WO CM  Result Date: 06/05/2021 CLINICAL DATA:  Chest pain, hematemesis, neuro deficit EXAM: CT ANGIOGRAPHY HEAD AND NECK TECHNIQUE: Multidetector CT imaging of the head and neck was performed using the standard protocol during bolus administration of intravenous contrast. Multiplanar CT image reconstructions and MIPs were obtained to evaluate the vascular anatomy. Carotid stenosis measurements (when applicable) are obtained utilizing NASCET criteria, using the distal internal carotid diameter as the  denominator. RADIATION DOSE REDUCTION: This exam was performed according to the departmental dose-optimization program which includes automated exposure control, adjustment of the mA and/or kV according to patient size and/or use of iterative reconstruction technique. CONTRAST:  14mL OMNIPAQUE IOHEXOL 350 MG/ML SOLN COMPARISON:  CT head 11/01/2016 FINDINGS: CT HEAD FINDINGS Brain: There is no evidence of acute intracranial hemorrhage, extra-axial fluid collection, or acute infarct. Parenchymal volume is normal. The ventricles are normal in size. Gray-white differentiation is preserved. There is no mass lesion.  There is no midline shift. Vascular: See below. Skull: Normal. Negative for fracture or focal lesion. Sinuses and orbits: The paranasal sinuses are clear. The globes and orbits are  unremarkable. Other: None. Review of the MIP images confirms the above findings CTA NECK FINDINGS Aortic arch: The aortic arch is normal. There is a common origin of the brachiocephalic and left common carotid arteries, a normal variant. The origins of the major branch vessels are patent. The subclavian arteries are patent. Right carotid system: The right common, internal common external carotid arteries are patent, without hemodynamically significant stenosis or occlusion. There is no dissection or aneurysm. Left carotid system: The left common, internal, and external carotid arteries are patent, without hemodynamically significant stenosis or occlusion. There is no dissection or aneurysm. Vertebral arteries: A portion of the proximal V2 segments are suboptimally evaluated due to streak artifact from surrounding venous enhancement, but are likely patent. The remainder of the vertebral arteries are widely patent. There is no evidence of dissection or aneurysm. Skeleton: There is mild degenerative change at C4-C5 through C6-C7. There is no acute osseous abnormality or aggressive osseous lesion. There is no visible canal hematoma. Other neck: Numerous dental caries are noted. The soft tissues of the neck are unremarkable. Upper chest: The imaged lung apices are clear. The lungs are assessed in full on the separately dictated CTA chest/abdomen/pelvis. Review of the MIP images confirms the above findings CTA HEAD FINDINGS Anterior circulation: The intracranial ICAs are patent. The bilateral MCAs are patent. The bilateral ACAs are patent. There is no aneurysm or AVM. Posterior circulation: The bilateral V4 segments are patent. PICA is identified bilaterally. The basilar artery is patent. The bilateral PCAs are patent. Bilateral posterior communicating arteries are identified. There is no aneurysm or AVM. Venous sinuses: Patent. Anatomic variants: None. Review of the MIP images confirms the above findings IMPRESSION: 1.  No acute intracranial pathology. 2. Patent vasculature of the head and neck. Electronically Signed   By: Valetta Mole M.D.   On: 06/05/2021 15:40   DG Chest 2 View  Result Date: 06/05/2021 CLINICAL DATA:  Chest pain EXAM: CHEST - 2 VIEW COMPARISON:  Chest x-ray 05/01/2015 FINDINGS: Heart size upper normal. Mediastinal contours are within normal limits. No suspicious pulmonary opacities identified. No pleural effusion or pneumothorax visualized. No acute osseous abnormality appreciated. IMPRESSION: No acute intrathoracic process identified. Electronically Signed   By: Ofilia Neas M.D.   On: 06/05/2021 12:08   MR BRAIN WO CONTRAST  Result Date: 06/06/2021 CLINICAL DATA:  41 year old male with chest pain, hematemesis, neurologic deficit. EXAM: MRI HEAD WITHOUT CONTRAST TECHNIQUE: Multiplanar, multiecho pulse sequences of the brain and surrounding structures were obtained without intravenous contrast. COMPARISON:  CT head and CTA head and neck yesterday. FINDINGS: Brain: No restricted diffusion to suggest acute infarction. No midline shift, mass effect, evidence of mass lesion, ventriculomegaly, extra-axial collection or acute intracranial hemorrhage. Cervicomedullary junction and pituitary are within normal limits. Patchy periventricular, periatrial white matter T2 and FLAIR hyperintensity  is nonspecific. Minimal additional mostly central white matter T2 and FLAIR heterogeneity. No cortical encephalomalacia or chronic cerebral blood products identified. Deep gray matter nuclei, brainstem and cerebellum appear normal. Vascular: Major intracranial vascular flow voids are preserved. Skull and upper cervical spine: Negative visible cervical spine. Visualized bone marrow signal is within normal limits. Sinuses/Orbits: Disconjugate gaze but otherwise negative orbits. Paranasal sinuses and mastoids are stable and well aerated. Other: Grossly normal visible internal auditory structures. Negative visible scalp and  face. IMPRESSION: 1. No acute intracranial abnormality. 2. Mildly to moderately age advanced but nonspecific cerebral white matter signal changes. Electronically Signed   By: Genevie Ann M.D.   On: 06/06/2021 06:41   MR CERVICAL SPINE WO CONTRAST  Result Date: 06/06/2021 CLINICAL DATA:  Chronic myelopathy cervical spine. EXAM: MRI CERVICAL SPINE WITHOUT CONTRAST TECHNIQUE: Multiplanar, multisequence MR imaging of the cervical spine was performed. No intravenous contrast was administered. COMPARISON:  None. FINDINGS: Alignment: Normal Vertebrae: Normal bone marrow.  Negative for fracture or mass Cord: Normal signal and morphology Posterior Fossa, vertebral arteries, paraspinal tissues: Nonpathologic lymph nodes in the neck bilaterally. No mass or soft tissue edema. Disc levels: C2-3: Negative C3-4: Negative C4-5: Mild disc degeneration. Mild disc bulging without significant stenosis. C5-6: Mild disc bulging.  Negative for stenosis C6-7: Negative C7-T1: Mild facet degeneration. Moderate left foraminal narrowing. Mild right foraminal narrowing. IMPRESSION: Mild degenerative changes cervical spine. Foraminal narrowing bilaterally at C7-T1 left greater than right. Normal cord signal. Electronically Signed   By: Franchot Gallo M.D.   On: 06/06/2021 15:05   ECHOCARDIOGRAM COMPLETE BUBBLE STUDY  Result Date: 06/06/2021    ECHOCARDIOGRAM REPORT   Patient Name:   [facility] Date of Exam: 06/06/2021 Medical Rec #:  [id number]       Height:       68.0 in Accession #:    [id number]      Weight:       280.0 lb Date of Birth:  23-Aug-1980       BSA:          2.358 m Patient Age:    37 years        BP:           144/100 mmHg Patient Gender: M               HR:           81 bpm. Exam Location:  Inpatient Procedure: 2D Echo, 3D Echo, Cardiac Doppler, Color Doppler, Strain Analysis and            Saline Contrast Bubble Study Indications:    Stroke  History:        Patient has no prior history of Echocardiogram examinations.                  Signs/Symptoms:Chest Pain and Dizziness/Lightheadedness; Risk                 Factors:Hypertension.  Sonographer:    Roseanna Rainbow RDCS Referring Phys: [id number] [place]  Sonographer Comments: Technically difficult study due to poor echo windows and patient is morbidly obese. Image acquisition challenging due to patient body habitus. IMPRESSIONS  1. Left ventricular ejection fraction, by estimation, is 55 to 60%. The left ventricle has normal function. The left ventricle has no regional wall motion abnormalities. There is severe concentric left ventricular hypertrophy. Left ventricular diastolic  parameters are consistent with Grade I diastolic dysfunction (impaired relaxation). The average left ventricular global longitudinal strain  is -16.9 %. The global longitudinal strain is abnormal.  2. Right ventricular systolic function is normal. The right ventricular size is normal. Tricuspid regurgitation signal is inadequate for assessing PA pressure.  3. The mitral valve is normal in structure. No evidence of mitral valve regurgitation. No evidence of mitral stenosis.  4. The aortic valve is tricuspid. Aortic valve regurgitation is not visualized. No aortic stenosis is present.  5. Aortic dilatation noted. There is mild dilatation of the ascending aorta, measuring 37 mm.  6. The inferior vena cava is normal in size with greater than 50% respiratory variability, suggesting right atrial pressure of 3 mmHg.  7. Negative bubble study, no evidence for PFO or ASD. FINDINGS  Left Ventricle: Left ventricular ejection fraction, by estimation, is 55 to 60%. The left ventricle has normal function. The left ventricle has no regional wall motion abnormalities. The average left ventricular global longitudinal strain is -16.9 %. The global longitudinal strain is abnormal. The left ventricular internal cavity size was normal in size. There is severe concentric left ventricular hypertrophy. Left ventricular diastolic  parameters are consistent with Grade I diastolic dysfunction (impaired relaxation). Right Ventricle: The right ventricular size is normal. No increase in right ventricular wall thickness. Right ventricular systolic function is normal. Tricuspid regurgitation signal is inadequate for assessing PA pressure. Left Atrium: Left atrial size was normal in size. Right Atrium: Right atrial size was normal in size. Pericardium: There is no evidence of pericardial effusion. Mitral Valve: The mitral valve is normal in structure. No evidence of mitral valve regurgitation. No evidence of mitral valve stenosis. Tricuspid Valve: The tricuspid valve is normal in structure. Tricuspid valve regurgitation is not demonstrated. Aortic Valve: The aortic valve is tricuspid. Aortic valve regurgitation is not visualized. No aortic stenosis is present. Pulmonic Valve: The pulmonic valve was normal in structure. Pulmonic valve regurgitation is not visualized. Aorta: The aortic root is normal in size and structure and aortic dilatation noted. There is mild dilatation of the ascending aorta, measuring 37 mm. Venous: The inferior vena cava is normal in size with greater than 50% respiratory variability, suggesting right atrial pressure of 3 mmHg. IAS/Shunts: Negative bubble study, no evidence for PFO or ASD. Agitated saline contrast was given intravenously to evaluate for intracardiac shunting.  LEFT VENTRICLE PLAX 2D LVIDd:         4.20 cm LVIDs:         3.10 cm      2D Longitudinal Strain LV PW:         2.85 cm      2D Strain GLS Avg:     -16.9 % LV IVS:        2.00 cm LVOT diam:     2.30 cm LV SV:         69 LV SV Index:   29           3D Volume EF: LVOT Area:     4.15 cm     3D EF:        50 %                             LV EDV:       233 ml                             LV ESV:       115 ml LV  Volumes (MOD)            LV SV:        117 ml LV vol d, MOD A2C: 150.0 ml LV vol d, MOD A4C: 131.0 ml LV vol s, MOD A2C: 76.2 ml LV vol s, MOD A4C:  62.7 ml LV SV MOD A2C:     73.8 ml LV SV MOD A4C:     131.0 ml LV SV MOD BP:      73.2 ml RIGHT VENTRICLE             IVC RV S prime:     12.00 cm/s  IVC diam: 1.10 cm TAPSE (M-mode): 2.2 cm LEFT ATRIUM             Index        RIGHT ATRIUM           Index LA diam:        2.60 cm 1.10 cm/m   RA Area:     16.60 cm LA Vol (A2C):   30.8 ml 13.06 ml/m  RA Volume:   47.30 ml  20.06 ml/m LA Vol (A4C):   40.2 ml 17.05 ml/m LA Biplane Vol: 36.0 ml 15.27 ml/m  AORTIC VALVE LVOT Vmax:   100.00 cm/s LVOT Vmean:  64.500 cm/s LVOT VTI:    0.167 m  AORTA Ao Root diam: 3.60 cm Ao Asc diam:  3.70 cm MITRAL VALVE MV Area (PHT): 3.68 cm    SHUNTS MV Decel Time: 206 msec    Systemic VTI:  0.17 m MV E velocity: 42.45 cm/s  Systemic Diam: 2.30 cm MV A velocity: 74.95 cm/s MV E/A ratio:  0.57 Dalton McleanMD Electronically signed by Franki Monte Signature Date/Time: 06/06/2021/4:22:19 PM    Final    CT Angio Chest/Abd/Pel for Dissection W and/or Wo Contrast  Result Date: 06/05/2021 CLINICAL DATA:  Acute aortic syndrome. Chest pain and vomiting blood. EXAM: CT ANGIOGRAPHY CHEST, ABDOMEN AND PELVIS TECHNIQUE: Non-contrast CT of the chest was initially obtained. Multidetector CT imaging through the chest, abdomen and pelvis was performed using the standard protocol during bolus administration of intravenous contrast. Multiplanar reconstructed images and MIPs were obtained and reviewed to evaluate the vascular anatomy. RADIATION DOSE REDUCTION: This exam was performed according to the departmental dose-optimization program which includes automated exposure control, adjustment of the mA and/or kV according to patient size and/or use of iterative reconstruction technique. CONTRAST:  1107mL OMNIPAQUE IOHEXOL 350 MG/ML SOLN COMPARISON:  CT stone study 09/15/2016 FINDINGS: CTA CHEST FINDINGS Cardiovascular: Pre contrast imaging shows no hyperdense crescent in the wall of the thoracic aorta to suggest acute intramural hematoma. Imaging  after IV contrast administration shows no thoracic aortic aneurysm with ascending thoracic aorta measuring approximately 3.5 cm diameter. No dissection of the thoracic aorta. Bovine arch vessel anatomy evident, normal variant. Arch vessel anatomy is widely patent. Heart size upper normal. No pericardial effusion. Mediastinum/Nodes: No mediastinal lymphadenopathy. There is no hilar lymphadenopathy. The esophagus has normal imaging features. There is no axillary lymphadenopathy. Lungs/Pleura: No suspicious pulmonary nodule or mass. No focal airspace consolidation. There is no evidence of pleural effusion. Musculoskeletal: No worrisome lytic or sclerotic osseous abnormality. Review of the MIP images confirms the above findings. CTA ABDOMEN AND PELVIS FINDINGS VASCULAR Aorta: Normal caliber aorta without aneurysm, dissection, vasculitis or significant stenosis. Celiac: Patent without evidence of aneurysm, dissection, vasculitis or significant stenosis. SMA: Patent without evidence of aneurysm, dissection, vasculitis or significant stenosis. Renals: Both renal arteries are patent without  evidence of aneurysm, dissection, vasculitis, fibromuscular dysplasia or significant stenosis. IMA: Patent without evidence of aneurysm, dissection, vasculitis or significant stenosis. Inflow: Patent without evidence of aneurysm, dissection, vasculitis or significant stenosis. Veins: No obvious venous abnormality within the limitations of this arterial phase study. Review of the MIP images confirms the above findings. NON-VASCULAR Hepatobiliary: No suspicious focal abnormality within the liver parenchyma. There is no evidence for gallstones, gallbladder wall thickening, or pericholecystic fluid. No intrahepatic or extrahepatic biliary dilation. Pancreas: No focal mass lesion. No dilatation of the main duct. No intraparenchymal cyst. No peripancreatic edema. Spleen: No splenomegaly. No focal mass lesion. Adrenals/Urinary Tract: No adrenal  nodule or mass. Kidneys unremarkable. No evidence for hydroureter. The urinary bladder appears normal for the degree of distention. Stomach/Bowel: Stomach is unremarkable. No gastric wall thickening. No evidence of outlet obstruction. Duodenum is normally positioned as is the ligament of Treitz. No small bowel wall thickening. No small bowel dilatation. The terminal ileum is normal. Mild distension noted mid appendix with no periappendiceal edema or inflammation. No gross colonic mass. No colonic wall thickening. Lymphatic: There is no gastrohepatic or hepatoduodenal ligament lymphadenopathy. No retroperitoneal or mesenteric lymphadenopathy. No pelvic sidewall lymphadenopathy. Reproductive: The prostate gland and seminal vesicles are unremarkable. Other: No intraperitoneal free fluid. Musculoskeletal: No worrisome lytic or sclerotic osseous abnormality. Review of the MIP images confirms the above findings. IMPRESSION: 1. No thoracoabdominal aortic aneurysm or dissection. No evidence for acute intramural hematoma of the thoracic aorta. 2. No acute findings in the chest, abdomen, or pelvis to explain the patient's history of chest pain and vomiting. Electronically Signed   By: Misty Stanley M.D.   On: 06/05/2021 15:47     LOS: 0 days   Oren Binet, MD  [facility]    To contact the attending provider between 7A-7P or the covering provider during after hours 7P-7A, please log into the web site [email or web address] and access using universal [place] password for that web site. If you do not have the password, please call the hospital operator.  06/07/2021, 11:23 AM

## 2021-06-07 NOTE — Consult Note (Addendum)
Cardiology Consultation:   Patient ID: Alejandro Santos MRN: [id number]; DOB: 1981/04/10  Admit date: 06/05/2021 Date of Consult: 06/07/2021  PCP:  Margo Common, PA-C (Inactive)   [facility] Providers Cardiologist:  None        Patient Profile:   Alejandro Santos is a 40 y.o. male with a hx of Urgent Care visit 06/04/2021 for chest pain, HTN, morbid obesity, who is being seen 06/07/2021 for the evaluation of bradycardia at the request of Dr. Sloan Leiter.  History of Present Illness:   Alejandro Santos has never seen cardiology before.  All this started on 2/13.  He was working on a generator, and taking it apart.  The coolant had burned, and created some fumes.  He stated the fumes smelled toxic.  Although he was wearing a mask, he got the fumes and immediately felt lightheaded and dizzy.  Then the chest pain started.  He went to Urgent Care on 2/13 for chest pain described as a squeezing up to a 7/10.  This was associated with shortness of breath and nausea.  He also had some tingling in his fingers.  The pain was both right and left-sided.  He admitted to anxiety.  He requested refills on his medications, he had been out for several weeks.  He was initially in sinus tachycardia, heart rate 160.    He was transferred to the ER for further evaluation.  Cardiac enzymes were negative, labs, ECG and CXR were normal, except for creatinine 1.5.  Creatinine previously 1.23. Chest pain felt to be nonspecific, referred to cardiology as an outpatient.  Prescription for Ultram given.  He came to the [facility] ER on 2/14 for chest pain and vomiting blood.  He stated the blood was dark, not bright red.  He was still having chest pain, described as a pressure and squeezing.  Additionally, he was complaining of a headache as well as right hand numbness and tingling.  Cardiac enzymes were negative, CXR was nonacute.   He was initially hypertensive with blood pressure of 181/126.  CT was negative for aortic  dissection.  Neurology was consulted and recommended admission, he was transferred to [facility].  Head CT and MRI were without acute abnormalities.  Overnight, he was noted to have pauses on telemetry and cardiology was asked to evaluate.  Mr. Entrikin has finally gotten some improvement in his pain.  When the pain was bad, it would get up to a 7/10, tightness and pressure.  He has never had it before.  It was not associated with exertion.  It was associated with shortness of breath and nausea.  On 2/14, he started vomiting.  Initially the vomiting was clear, but then turned bloody.  The vomiting has resolved.  He has been out of his blood pressure medications for several weeks.  He has a blood pressure cuff at home but does not use it much.  He does not know what his blood pressure normally runs.  He has never been told he has sleep apnea.  He has been a heavy snorer for several years.  He denies any previous history of presyncope or syncope.   He denies orthopnea or PND.  He notes that he has been sleeping less recently, waking up after about 4 hours and unable to get back to sleep.  His main barrier to keeping follow-up appointments is his job.  He works on [employer], and is frequently out of town.  He has trouble scheduling appointments in advance, he does not  know for sure that he will be in town on any particular day.   Past Medical History:  Diagnosis Date   History of surgery on upper extremity    Multiple surgeries on right upper arm after an industrial accident   HTN (hypertension)     Past Surgical History:  Procedure Laterality Date   HAND SURGERY Right    right arm  x 6   KNEE SURGERY Left      Home Medications:  Prior to Admission medications   Medication Sig Start Date End Date Taking? Authorizing Provider  diphenhydramine-acetaminophen (TYLENOL PM) 25-500 MG TABS tablet Take 1 tablet by mouth at bedtime as needed.   Yes [provider]   lisinopril-hydrochlorothiazide (ZESTORETIC) 20-25 MG tablet Take 1 tablet by mouth daily. 08/02/19  Yes Chrismon, [name], PA-C  metoprolol succinate (TOPROL-XL) 50 MG 24 hr tablet TAKE 1 TABLET BY MOUTH EVERY DAY WITH OR IMMEDIATELY FOLLOWING A MEAL 07/03/19  Yes Chrismon, [name], PA-C  pregabalin (LYRICA) 50 MG capsule Take 50 mg by mouth 3 (three) times daily.    [provider]    Inpatient Medications: Scheduled Meds:  [START ON 06/08/2021] amLODipine  10 mg Oral Daily   enoxaparin (LOVENOX) injection  40 mg Subcutaneous Daily   Continuous Infusions:  sodium chloride     PRN Meds: acetaminophen **OR** acetaminophen, diphenhydrAMINE, hydrALAZINE, ondansetron **OR** ondansetron (ZOFRAN) IV, polyethylene glycol  Allergies:   No Known Allergies  Social History:   Social History   Socioeconomic History   Marital status: Married    Spouse name: Not on file   Number of children: Not on file   Years of education: Not on file   Highest education level: Not on file  Occupational History   Not on file  Tobacco Use   Smoking status: Never   Smokeless tobacco: Never  Vaping Use   Vaping Use: Never used  Substance and Sexual Activity   Alcohol use: No   Drug use: No   Sexual activity: Not on file  Other Topics Concern   Not on file  Social History Narrative   Not on file   Social Determinants of Health   Financial Resource Strain: Not on file  Food Insecurity: Not on file  Transportation Needs: Not on file  Physical Activity: Not on file  Stress: Not on file  Social Connections: Not on file  Intimate Partner Violence: Not on file    Family History:   Family History  Problem Relation Age of Onset   Breast cancer Mother    Healthy Father    Hypertension Father    Kidney cancer Neg Hx    Bladder Cancer Neg Hx    Prostate cancer Neg Hx      ROS:  Please see the history of present illness.  All other ROS reviewed and negative.     Physical Exam/Data:    Vitals:   06/07/21 0451 06/07/21 0736 06/07/21 1108 06/07/21 1156  BP: (!) 154/113 (!) 168/111 (!) 188/144 (!) 171/114  Pulse: 82 84 85 87  Resp: 14 20 18 12   Temp: 97.9 F (36.6 C) 98.5 F (36.9 C) 98.5 F (36.9 C) 98.7 F (37.1 C)  TempSrc: Oral Oral  Oral  SpO2: 91% 99% 100% 100%  Weight:      Height:        Intake/Output Summary (Last 24 hours) at 06/07/2021 1509 Last data filed at 06/07/2021 1100 Gross per 24 hour  Intake 480 ml  Output --  Net 480 ml   Last 3 Weights 06/05/2021 08/02/2019 02/01/2019  Weight (lbs) 280 lb 285 lb 282 lb  Weight (kg) 127.007 kg 129.275 kg 127.914 kg     Body mass index is 42.57 kg/m.  General:  Well nourished, well developed, in no acute distress HEENT: normal Neck: no JVD Vascular: No carotid bruits; Distal pulses 2+ bilaterally Cardiac:  normal S1, S2; RRR; no murmur  Lungs:  clear to auscultation bilaterally, no wheezing, rhonchi or rales  Abd: soft, nontender, no hepatomegaly  Ext: no edema Musculoskeletal:  No deformities, BUE and BLE strength normal and equal Skin: warm and dry  Neuro:  CNs 2-12 intact, no focal abnormalities noted Psych:  Normal affect   EKG:  The EKG was personally reviewed and demonstrates:  SR, HR 90, no acute ischemic changes Telemetry:  Telemetry was personally reviewed and demonstrates: Sinus rhythm at baseline, episodes of P waves without ventricular activity up to 9 seconds long, during sleep  Relevant CV Studies:  ECHO: 06/06/2021  1. Left ventricular ejection fraction, by estimation, is 55 to 60%. The  left ventricle has normal function. The left ventricle has no regional  wall motion abnormalities. There is severe concentric left ventricular  hypertrophy. Left ventricular diastolic  parameters are consistent with Grade I diastolic dysfunction (impaired relaxation). The average left ventricular global longitudinal strain is -16.9 %. The global longitudinal strain is abnormal.   2. Right  ventricular systolic function is normal. The right ventricular  size is normal. Tricuspid regurgitation signal is inadequate for assessing PA pressure.   3. The mitral valve is normal in structure. No evidence of mitral valve regurgitation. No evidence of mitral stenosis.   4. The aortic valve is tricuspid. Aortic valve regurgitation is not  visualized. No aortic stenosis is present.   5. Aortic dilatation noted. 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   6. The inferior vena cava is normal in size with greater than 50%  respiratory variability, suggesting right atrial pressure of 3 mmHg.   7. Negative bubble study, no evidence for PFO or ASD.   Laboratory Data:  High Sensitivity Troponin:   Recent Labs  Lab 06/05/21 1139 06/05/21 1500  TROPONINIHS 9 11     Chemistry Recent Labs  Lab 06/05/21 1139 06/06/21 0403 06/07/21 0144  NA 136 137 137  K 3.7 3.6 3.5  CL 97* 97* 99  CO2 27 29 27   GLUCOSE 142* 105* 98  BUN 16 19 36*  CREATININE 1.52* 2.01* 2.07*  CALCIUM 9.5 8.6* 9.0  MG 1.9 1.9 2.1  GFRNONAA 59* 42* 41*  ANIONGAP 12 11 11     Recent Labs  Lab 06/06/21 0403  PROT 7.3  ALBUMIN 3.8  AST 24  ALT 27  ALKPHOS 51  BILITOT 0.9   Lipids  Recent Labs  Lab 06/06/21 0403  CHOL 223*  TRIG 134  HDL 45  LDLCALC 151*  CHOLHDL 5.0    Hematology Recent Labs  Lab 06/05/21 1139 06/06/21 0403  WBC 9.6 9.3  RBC 5.38 5.03  HGB 15.5 14.5  HCT 46.3 43.9  MCV 86.1 87.3  MCH 28.8 28.8  MCHC 33.5 33.0  RDW 13.4 13.7  PLT 409* 406*   Thyroid  Recent Labs  Lab 06/07/21 0144  TSH 1.655    BNPNo results for input(s): BNP, PROBNP in the last 168 hours.  DDimer No results for input(s): DDIMER in the last 168 hours.   Radiology/Studies:  CT ANGIO HEAD NECK W WO CM  Result Date: 06/05/2021 CLINICAL DATA:  Chest pain, hematemesis, neuro deficit EXAM: CT ANGIOGRAPHY HEAD AND NECK TECHNIQUE: Multidetector CT imaging of the head and neck was  performed using the standard protocol during bolus administration of intravenous contrast. Multiplanar CT image reconstructions and MIPs were obtained to evaluate the vascular anatomy. Carotid stenosis measurements (when applicable) are obtained utilizing NASCET criteria, using the distal internal carotid diameter as the denominator. RADIATION DOSE REDUCTION: This exam was performed according to the departmental dose-optimization program which includes automated exposure control, adjustment of the mA and/or kV according to patient size and/or use of iterative reconstruction technique. CONTRAST:  116mL OMNIPAQUE IOHEXOL 350 MG/ML SOLN COMPARISON:  CT head 11/01/2016 FINDINGS: CT HEAD FINDINGS Brain: There is no evidence of acute intracranial hemorrhage, extra-axial fluid collection, or acute infarct. Parenchymal volume is normal. The ventricles are normal in size. Gray-white differentiation is preserved. There is no mass lesion.  There is no midline shift. Vascular: See below. Skull: Normal. Negative for fracture or focal lesion. Sinuses and orbits: The paranasal sinuses are clear. The globes and orbits are unremarkable. Other: None. Review of the MIP images confirms the above findings CTA NECK FINDINGS Aortic arch: The aortic arch is normal. There is a common origin of the brachiocephalic and left common carotid arteries, a normal variant. The origins of the major branch vessels are patent. The subclavian arteries are patent. Right carotid system: The right common, internal common external carotid arteries are patent, without hemodynamically significant stenosis or occlusion. There is no dissection or aneurysm. Left carotid system: The left common, internal, and external carotid arteries are patent, without hemodynamically significant stenosis or occlusion. There is no dissection or aneurysm. Vertebral arteries: A portion of the proximal V2 segments are suboptimally evaluated due to streak artifact from surrounding  venous enhancement, but are likely patent. The remainder of the vertebral arteries are widely patent. There is no evidence of dissection or aneurysm. Skeleton: There is mild degenerative change at C4-C5 through C6-C7. There is no acute osseous abnormality or aggressive osseous lesion. There is no visible canal hematoma. Other neck: Numerous dental caries are noted. The soft tissues of the neck are unremarkable. Upper chest: The imaged lung apices are clear. The lungs are assessed in full on the separately dictated CTA chest/abdomen/pelvis. Review of the MIP images confirms the above findings CTA HEAD FINDINGS Anterior circulation: The intracranial ICAs are patent. The bilateral MCAs are patent. The bilateral ACAs are patent. There is no aneurysm or AVM. Posterior circulation: The bilateral V4 segments are patent. PICA is identified bilaterally. The basilar artery is patent. The bilateral PCAs are patent. Bilateral posterior communicating arteries are identified. There is no aneurysm or AVM. Venous sinuses: Patent. Anatomic variants: None. Review of the MIP images confirms the above findings IMPRESSION: 1. No acute intracranial pathology. 2. Patent vasculature of the head and neck. Electronically Signed   By: Valetta Mole M.D.   On: 06/05/2021 15:40   DG Chest 2 View  Result Date: 06/05/2021 CLINICAL DATA:  Chest pain EXAM: CHEST - 2 VIEW COMPARISON:  Chest x-ray 05/01/2015 FINDINGS: Heart size upper normal. Mediastinal contours are within normal limits. No suspicious pulmonary opacities identified. No pleural effusion or pneumothorax visualized. No acute osseous abnormality appreciated. IMPRESSION: No acute intrathoracic process identified. Electronically Signed   By: Ofilia Neas M.D.   On: 06/05/2021 12:08   MR BRAIN WO CONTRAST  Result Date: 06/06/2021 CLINICAL DATA:  41 year old male with chest  pain, hematemesis, neurologic deficit. EXAM: MRI HEAD WITHOUT CONTRAST TECHNIQUE: Multiplanar, multiecho  pulse sequences of the brain and surrounding structures were obtained without intravenous contrast. COMPARISON:  CT head and CTA head and neck yesterday. FINDINGS: Brain: No restricted diffusion to suggest acute infarction. No midline shift, mass effect, evidence of mass lesion, ventriculomegaly, extra-axial collection or acute intracranial hemorrhage. Cervicomedullary junction and pituitary are within normal limits. Patchy periventricular, periatrial white matter T2 and FLAIR hyperintensity is nonspecific. Minimal additional mostly central white matter T2 and FLAIR heterogeneity. No cortical encephalomalacia or chronic cerebral blood products identified. Deep gray matter nuclei, brainstem and cerebellum appear normal. Vascular: Major intracranial vascular flow voids are preserved. Skull and upper cervical spine: Negative visible cervical spine. Visualized bone marrow signal is within normal limits. Sinuses/Orbits: Disconjugate gaze but otherwise negative orbits. Paranasal sinuses and mastoids are stable and well aerated. Other: Grossly normal visible internal auditory structures. Negative visible scalp and face. IMPRESSION: 1. No acute intracranial abnormality. 2. Mildly to moderately age advanced but nonspecific cerebral white matter signal changes. Electronically Signed   By: Genevie Ann M.D.   On: 06/06/2021 06:41   MR CERVICAL SPINE WO CONTRAST  Result Date: 06/06/2021 CLINICAL DATA:  Chronic myelopathy cervical spine. EXAM: MRI CERVICAL SPINE WITHOUT CONTRAST TECHNIQUE: Multiplanar, multisequence MR imaging of the cervical spine was performed. No intravenous contrast was administered. COMPARISON:  None. FINDINGS: Alignment: Normal Vertebrae: Normal bone marrow.  Negative for fracture or mass Cord: Normal signal and morphology Posterior Fossa, vertebral arteries, paraspinal tissues: Nonpathologic lymph nodes in the neck bilaterally. No mass or soft tissue edema. Disc levels: C2-3: Negative C3-4: Negative C4-5:  Mild disc degeneration. Mild disc bulging without significant stenosis. C5-6: Mild disc bulging.  Negative for stenosis C6-7: Negative C7-T1: Mild facet degeneration. Moderate left foraminal narrowing. Mild right foraminal narrowing. IMPRESSION: Mild degenerative changes cervical spine. Foraminal narrowing bilaterally at C7-T1 left greater than right. Normal cord signal. Electronically Signed   By: Franchot Gallo M.D.   On: 06/06/2021 15:05   ECHOCARDIOGRAM COMPLETE BUBBLE STUDY  Result Date: 06/06/2021    ECHOCARDIOGRAM REPORT   Patient Name:   Alejandro Santos Date of Exam: 06/06/2021 Medical Rec #:  [id number]       Height:       68.0 in Accession #:    [id number]      Weight:       280.0 lb Date of Birth:  04-29-1980       BSA:          2.358 m Patient Age:    40 years        BP:           144/100 mmHg Patient Gender: M               HR:           81 bpm. Exam Location:  Inpatient Procedure: 2D Echo, 3D Echo, Cardiac Doppler, Color Doppler, Strain Analysis and            Saline Contrast Bubble Study Indications:    Stroke  History:        Patient has no prior history of Echocardiogram examinations.                 Signs/Symptoms:Chest Pain and Dizziness/Lightheadedness; Risk                 Factors:Hypertension.  Sonographer:    Roseanna Rainbow RDCS Referring Phys: [id number] [place]  Sonographer Comments:  Technically difficult study due to poor echo windows and patient is morbidly obese. Image acquisition challenging due to patient body habitus. IMPRESSIONS  1. Left ventricular ejection fraction, by estimation, is 55 to 60%. The left ventricle has normal function. The left ventricle has no regional wall motion abnormalities. There is severe concentric left ventricular hypertrophy. Left ventricular diastolic  parameters are consistent with Grade I diastolic dysfunction (impaired relaxation). The average left ventricular global longitudinal strain is -16.9 %. The global longitudinal strain is abnormal.  2.  Right ventricular systolic function is normal. The right ventricular size is normal. Tricuspid regurgitation signal is inadequate for assessing PA pressure.  3. The mitral valve is normal in structure. No evidence of mitral valve regurgitation. No evidence of mitral stenosis.  4. The aortic valve is tricuspid. Aortic valve regurgitation is not visualized. No aortic stenosis is present.  5. Aortic dilatation noted. There is mild dilatation of the ascending aorta, measuring 37 mm.  6. The inferior vena cava is normal in size with greater than 50% respiratory variability, suggesting right atrial pressure of 3 mmHg.  7. Negative bubble study, no evidence for PFO or ASD. FINDINGS  Left Ventricle: Left ventricular ejection fraction, by estimation, is 55 to 60%. The left ventricle has normal function. The left ventricle has no regional wall motion abnormalities. The average left ventricular global longitudinal strain is -16.9 %. The global longitudinal strain is abnormal. The left ventricular internal cavity size was normal in size. There is severe concentric left ventricular hypertrophy. Left ventricular diastolic parameters are consistent with Grade I diastolic dysfunction (impaired relaxation). Right Ventricle: The right ventricular size is normal. No increase in right ventricular wall thickness. Right ventricular systolic function is normal. Tricuspid regurgitation signal is inadequate for assessing PA pressure. Left Atrium: Left atrial size was normal in size. Right Atrium: Right atrial size was normal in size. Pericardium: There is no evidence of pericardial effusion. Mitral Valve: The mitral valve is normal in structure. No evidence of mitral valve regurgitation. No evidence of mitral valve stenosis. Tricuspid Valve: The tricuspid valve is normal in structure. Tricuspid valve regurgitation is not demonstrated. Aortic Valve: The aortic valve is tricuspid. Aortic valve regurgitation is not visualized. No aortic  stenosis is present. Pulmonic Valve: The pulmonic valve was normal in structure. Pulmonic valve regurgitation is not visualized. Aorta: The aortic root is normal in size and structure and aortic dilatation noted. There is mild dilatation of the ascending aorta, measuring 37 mm. Venous: The inferior vena cava is normal in size with greater than 50% respiratory variability, suggesting right atrial pressure of 3 mmHg. IAS/Shunts: Negative bubble study, no evidence for PFO or ASD. Agitated saline contrast was given intravenously to evaluate for intracardiac shunting.  LEFT VENTRICLE PLAX 2D LVIDd:         4.20 cm LVIDs:         3.10 cm      2D Longitudinal Strain LV PW:         2.85 cm      2D Strain GLS Avg:     -16.9 % LV IVS:        2.00 cm LVOT diam:     2.30 cm LV SV:         69 LV SV Index:   29           3D Volume EF: LVOT Area:     4.15 cm     3D EF:        50 %  LV EDV:       233 ml                             LV ESV:       115 ml LV Volumes (MOD)            LV SV:        117 ml LV vol d, MOD A2C: 150.0 ml LV vol d, MOD A4C: 131.0 ml LV vol s, MOD A2C: 76.2 ml LV vol s, MOD A4C: 62.7 ml LV SV MOD A2C:     73.8 ml LV SV MOD A4C:     131.0 ml LV SV MOD BP:      73.2 ml RIGHT VENTRICLE             IVC RV S prime:     12.00 cm/s  IVC diam: 1.10 cm TAPSE (M-mode): 2.2 cm LEFT ATRIUM             Index        RIGHT ATRIUM           Index LA diam:        2.60 cm 1.10 cm/m   RA Area:     16.60 cm LA Vol (A2C):   30.8 ml 13.06 ml/m  RA Volume:   47.30 ml  20.06 ml/m LA Vol (A4C):   40.2 ml 17.05 ml/m LA Biplane Vol: 36.0 ml 15.27 ml/m  AORTIC VALVE LVOT Vmax:   100.00 cm/s LVOT Vmean:  64.500 cm/s LVOT VTI:    0.167 m  AORTA Ao Root diam: 3.60 cm Ao Asc diam:  3.70 cm MITRAL VALVE MV Area (PHT): 3.68 cm    SHUNTS MV Decel Time: 206 msec    Systemic VTI:  0.17 m MV E velocity: 42.45 cm/s  Systemic Diam: 2.30 cm MV A velocity: 74.95 cm/s MV E/A ratio:  0.57 Dalton McleanMD Electronically  signed by Franki Monte Signature Date/Time: 06/06/2021/4:22:19 PM    Final    CT Angio Chest/Abd/Pel for Dissection W and/or Wo Contrast  Result Date: 06/05/2021 CLINICAL DATA:  Acute aortic syndrome. Chest pain and vomiting blood. EXAM: CT ANGIOGRAPHY CHEST, ABDOMEN AND PELVIS TECHNIQUE: Non-contrast CT of the chest was initially obtained. Multidetector CT imaging through the chest, abdomen and pelvis was performed using the standard protocol during bolus administration of intravenous contrast. Multiplanar reconstructed images and MIPs were obtained and reviewed to evaluate the vascular anatomy. RADIATION DOSE REDUCTION: This exam was performed according to the departmental dose-optimization program which includes automated exposure control, adjustment of the mA and/or kV according to patient size and/or use of iterative reconstruction technique. CONTRAST:  164mL OMNIPAQUE IOHEXOL 350 MG/ML SOLN COMPARISON:  CT stone study 09/15/2016 FINDINGS: CTA CHEST FINDINGS Cardiovascular: Pre contrast imaging shows no hyperdense crescent in the wall of the thoracic aorta to suggest acute intramural hematoma. Imaging after IV contrast administration shows no thoracic aortic aneurysm with ascending thoracic aorta measuring approximately 3.5 cm diameter. No dissection of the thoracic aorta. Bovine arch vessel anatomy evident, normal variant. Arch vessel anatomy is widely patent. Heart size upper normal. No pericardial effusion. Mediastinum/Nodes: No mediastinal lymphadenopathy. There is no hilar lymphadenopathy. The esophagus has normal imaging features. There is no axillary lymphadenopathy. Lungs/Pleura: No suspicious pulmonary nodule or mass. No focal airspace consolidation. There is no evidence of pleural effusion. Musculoskeletal: No worrisome lytic or sclerotic osseous abnormality. Review of the MIP images confirms the  above findings. CTA ABDOMEN AND PELVIS FINDINGS VASCULAR Aorta: Normal caliber aorta without  aneurysm, dissection, vasculitis or significant stenosis. Celiac: Patent without evidence of aneurysm, dissection, vasculitis or significant stenosis. SMA: Patent without evidence of aneurysm, dissection, vasculitis or significant stenosis. Renals: Both renal arteries are patent without evidence of aneurysm, dissection, vasculitis, fibromuscular dysplasia or significant stenosis. IMA: Patent without evidence of aneurysm, dissection, vasculitis or significant stenosis. Inflow: Patent without evidence of aneurysm, dissection, vasculitis or significant stenosis. Veins: No obvious venous abnormality within the limitations of this arterial phase study. Review of the MIP images confirms the above findings. NON-VASCULAR Hepatobiliary: No suspicious focal abnormality within the liver parenchyma. There is no evidence for gallstones, gallbladder wall thickening, or pericholecystic fluid. No intrahepatic or extrahepatic biliary dilation. Pancreas: No focal mass lesion. No dilatation of the main duct. No intraparenchymal cyst. No peripancreatic edema. Spleen: No splenomegaly. No focal mass lesion. Adrenals/Urinary Tract: No adrenal nodule or mass. Kidneys unremarkable. No evidence for hydroureter. The urinary bladder appears normal for the degree of distention. Stomach/Bowel: Stomach is unremarkable. No gastric wall thickening. No evidence of outlet obstruction. Duodenum is normally positioned as is the ligament of Treitz. No small bowel wall thickening. No small bowel dilatation. The terminal ileum is normal. Mild distension noted mid appendix with no periappendiceal edema or inflammation. No gross colonic mass. No colonic wall thickening. Lymphatic: There is no gastrohepatic or hepatoduodenal ligament lymphadenopathy. No retroperitoneal or mesenteric lymphadenopathy. No pelvic sidewall lymphadenopathy. Reproductive: The prostate gland and seminal vesicles are unremarkable. Other: No intraperitoneal free fluid.  Musculoskeletal: No worrisome lytic or sclerotic osseous abnormality. Review of the MIP images confirms the above findings. IMPRESSION: 1. No thoracoabdominal aortic aneurysm or dissection. No evidence for acute intramural hematoma of the thoracic aorta. 2. No acute findings in the chest, abdomen, or pelvis to explain the patient's history of chest pain and vomiting. Electronically Signed   By: Misty Stanley M.D.   On: 06/05/2021 15:47     Assessment and Plan:   Bradycardia -All episodes of happened during sleep - Based on history and ECG, no evidence of daytime bradycardia - No history of PND, no presyncope or syncope - It always starts off with Darden Amber and 1 dropped beat.  The episodes progress to multiple dropped beats.   -Only once have they lasted long enough to cause a dip in his heart rate noticeable on a graph of telemetry review -During the dropped QRS episodes, the P-P interval lengthens somewhat as well. - If this is indeed related to sleep apnea (likely), then all of the above is vagally mediated. -EP to see -Prior to admission, he was supposed to be on Toprol-XL50 mg qd, but had been out for weeks.  He was restarted on this initially and got a dose yesterday morning. - This was then discontinued and he was placed on Coreg 6.25 mg twice daily. - He got a dose of that last night and then it was discontinued as well.  -He is currently not on a beta-blocker or other rate lowering medication  2.  Probable sleep apnea - he has been a heavy snorer for a long time. - No history of PND - Because of his travel schedule for work, doing a sleep study (even in home), or discharging him on overnight oxygen is problematic  3. Hypertension, uncontrolled -  Valsartan would be a good drug for him, but we cannot start it right now, because his creatinine is above baseline, start it once his  renal function normalizes -He had been off all medications for weeks - He was supposed to be on  lisinopril-HCTZ 20-25 mg daily and Toprol-XL 50 mg daily - Because of his abnormal renal function and dehydration from the nausea and vomiting, as well as the bradycardia, he is off all of his home medications - He has been started on amlodipine 5 mg which she has gotten and then this is being increased to 10 mg daily tomorrow. With the bradycardia, avoid beta-blockers - Discuss adding hydralazine with MD  4.  AKI: - His creatinine was 1.58 in 2018 and over time improved to 1.23 in 2021. -I do not have any labs from 2022, he may not have had any done. - At outside facility 2/13, BUN 50 creatinine 1.50 - On arrival to [facility], creatinine 1.52 and has trended up to 2.07, in the setting of nausea and vomiting - BUN trended up as well, is now 36 -We will add IV normal saline at 75 cc an hour and recheck labs in a.m. - He had a CT angio chest abdomen and pelvis for dissection and a CT angio of the head with and without contrast while in the ER. - He may have an element of contrast-induced nephropathy as well  5.  Chest pain - Symptoms started after inhaling some toxic fumes at work - Symptoms have improved - No history of exertional symptoms, sometimes his job is very physically strenuous -EF is normal by echo with no wall motion abnormalities - Cardiac enzymes remained negative for MI - MD advise if any further evaluation is indicated   Risk Assessment/Risk Scores:     HEAR Score (for undifferentiated chest pain):  HEAR Score: 2   For questions or updates, please contact [place] Please consult [email or web address] for contact info under   Patient seen and examined, note reviewed with the signed Advanced Practice Provider. I personally reviewed laboratory data, imaging studies and relevant notes. I independently examined the patient and formulated the important aspects of the plan. I have personally discussed the plan with the patient and/or family. Comments or changes to the note/plan are  indicated below.  He was sitting eating when I arrived.  NO complaints at this time. I was able to review the Tele script - suspect this is vagally mediated the setting of OSA. All episode occurs during sleep. Best way forward is to diagnose and treat his OSA, but for completion will get EP weigh in as well.   He is hypertensive- currently on Amlodipine, will add valsartan. I was able to take some time to speak to the patient about his diagnosis of hypertension. He need better control because his echo now shows end organ damage with severe LVH.   Morbid Obesity- lifestyle modification.      Berniece Salines DO, MS [facility] Attending Cardiologist [place]  [address] [place], [place] [address] [phone] Website: [email or web address]    SignedRosaria Ferries, PA-C  06/07/2021 3:09 PM

## 2021-06-08 ENCOUNTER — Other Ambulatory Visit (HOSPITAL_COMMUNITY): Payer: Self-pay

## 2021-06-08 DIAGNOSIS — N179 Acute kidney failure, unspecified: Secondary | ICD-10-CM

## 2021-06-08 DIAGNOSIS — R079 Chest pain, unspecified: Secondary | ICD-10-CM

## 2021-06-08 DIAGNOSIS — R001 Bradycardia, unspecified: Secondary | ICD-10-CM

## 2021-06-08 DIAGNOSIS — I161 Hypertensive emergency: Secondary | ICD-10-CM | POA: Diagnosis not present

## 2021-06-08 DIAGNOSIS — R200 Anesthesia of skin: Secondary | ICD-10-CM | POA: Diagnosis not present

## 2021-06-08 LAB — BASIC METABOLIC PANEL
Anion gap: 9 (ref 5–15)
BUN: 27 mg/dL — ABNORMAL HIGH (ref 6–20)
CO2: 26 mmol/L (ref 22–32)
Calcium: 8.9 mg/dL (ref 8.9–10.3)
Chloride: 102 mmol/L (ref 98–111)
Creatinine, Ser: 1.69 mg/dL — ABNORMAL HIGH (ref 0.61–1.24)
GFR, Estimated: 52 mL/min — ABNORMAL LOW (ref >60)
Glucose, Bld: 108 mg/dL — ABNORMAL HIGH (ref 70–99)
Potassium: 3.3 mmol/L — ABNORMAL LOW (ref 3.5–5.1)
Sodium: 137 mmol/L (ref 135–145)

## 2021-06-08 MED ORDER — LOSARTAN POTASSIUM 50 MG PO TABS
100.0000 mg | ORAL_TABLET | Freq: Every day | ORAL | Status: DC
  Administered 2021-06-08: 100 mg via ORAL
  Filled 2021-06-08: qty 2

## 2021-06-08 MED ORDER — POTASSIUM CHLORIDE CRYS ER 20 MEQ PO TBCR
40.0000 meq | EXTENDED_RELEASE_TABLET | Freq: Once | ORAL | Status: AC
Start: 2021-06-08 — End: 2021-06-08
  Administered 2021-06-08: 40 meq via ORAL
  Filled 2021-06-08: qty 2

## 2021-06-08 MED ORDER — ATORVASTATIN CALCIUM 20 MG PO TABS
20.0000 mg | ORAL_TABLET | Freq: Every day | ORAL | 11 refills | Status: DC
  Filled 2021-06-08: qty 30, 30d supply, fill #0

## 2021-06-08 MED ORDER — AMLODIPINE BESYLATE 10 MG PO TABS
10.0000 mg | ORAL_TABLET | Freq: Every day | ORAL | 11 refills | Status: DC
  Filled 2021-06-08: qty 30, 30d supply, fill #0

## 2021-06-08 MED ORDER — LOSARTAN POTASSIUM 50 MG PO TABS: 50.0000 mg | ORAL_TABLET | Freq: Every day | ORAL | Status: DC

## 2021-06-08 MED ORDER — LOSARTAN POTASSIUM 100 MG PO TABS
100.0000 mg | ORAL_TABLET | Freq: Every day | ORAL | 11 refills | Status: DC
  Filled 2021-06-08: qty 30, 30d supply, fill #0

## 2021-06-08 NOTE — Plan of Care (Signed)

## 2021-06-08 NOTE — Progress Notes (Signed)
Pt's BP down to 162/112 after Losartan.   Discharge paperwork reviewed with pt. Pt verbalized understanding. Pt stated he wants to eat his lunch and that is sister is on the way to pick him up for discharge.

## 2021-06-08 NOTE — Progress Notes (Signed)
Rechecked pt's BP because it was elevated this am. Pt was given amlodipine this am. Pt's BP still elevated 178/125. Administered ordered Losartan given, will recheck shortly.

## 2021-06-08 NOTE — Progress Notes (Signed)
Occupational Therapy Treatment Patient Details Name: Alejandro Santos MRN: [id number] DOB: [birth year] Today's Date: 06/08/2021   History of present illness 41 y.o. male presenting from med center [facility] 2/14 with moderate to severe chest pain, RUE numbness, headaches and blurry vision. BP 193/133. Imaging (-) for acute findings. Patient admitted with hypertensive emergency. PMHx significant for uncontrolled HTN.   OT comments  Patient standing at sink completing bathing when COTA arrived in room. Patient was min assist to donn gown due to IV lines.  Patient assisted with making bed while standing. Patient provided squeeze ball for Right hand to address numbness and tingling. Patient is independent with self care and no longer requires OT services.    Recommendations for follow up therapy are one component of a multi-disciplinary discharge planning process, led by the attending physician.  Recommendations may be updated based on patient status, additional functional criteria and insurance authorization.    Follow Up Recommendations  No OT follow up    Assistance Recommended at Discharge None  Patient can return home with the following  Other (comment) (no assistance needed with self care and mobiltiy)   Equipment Recommendations  None recommended by OT    Recommendations for Other Services      Precautions / Restrictions Precautions Precaution Comments: Premissive hypertension SBP <220 Restrictions Weight Bearing Restrictions: No       Mobility Bed Mobility Overal bed mobility: Independent             General bed mobility comments: OOB standng at sink upon entry and sitting on EOB at end of session    Transfers Overall transfer level: Independent Equipment used: None               General transfer comment: independent with mobility in room     Balance Overall balance assessment: Independent                                         ADL either  performed or assessed with clinical judgement   ADL Overall ADL's : Modified independent                                       General ADL Comments: patient required assistance to donn gown due to IV lines    Extremity/Trunk Assessment Upper Extremity Assessment RUE Deficits / Details: AROM and strength WNL RUE Coordination: WNL LUE Deficits / Details: AROM and strength WNL LUE Sensation: WNL LUE Coordination: WNL            Vision       Perception     Praxis      Cognition Arousal/Alertness: Awake/alert Behavior During Therapy: WFL for tasks assessed/performed Overall Cognitive Status: Within Functional Limits for tasks assessed                                 General Comments: A&Ox4        Exercises Exercises: Other exercises Other Exercises Other Exercises: provided squeeze ball and instruction to use to address RUE numbness and tingling    Shoulder Instructions       General Comments      Pertinent Vitals/ Pain       Pain Assessment Pain Assessment: Faces Faces Pain  Scale: Hurts little more Pain Location: headache Pain Descriptors / Indicators: Headache, Constant Pain Intervention(s): Limited activity within patient's tolerance, Monitored during session, Ice applied  Home Living                                          Prior Functioning/Environment              Frequency  Other (comment) (no OT services needed)        Progress Toward Goals  OT Goals(current goals can now be found in the care plan section)  Progress towards OT goals: Progressing toward goals  Acute Rehab OT Goals Patient Stated Goal: go home OT Goal Formulation: With patient Time For Goal Achievement: 06/20/21 Potential to Achieve Goals: Good ADL Goals Additional ADL Goal #1: Patient will complete ADLs with I in prep for return to PLOF.  Plan Discharge plan remains appropriate    Co-evaluation                  AM-PAC OT "6 Clicks" Daily Activity     Outcome Measure   Help from another person eating meals?: None Help from another person taking care of personal grooming?: None Help from another person toileting, which includes using toliet, bedpan, or urinal?: None Help from another person bathing (including washing, rinsing, drying)?: None Help from another person to put on and taking off regular upper body clothing?: None Help from another person to put on and taking off regular lower body clothing?: None 6 Click Score: 24    End of Session    OT Visit Diagnosis: Muscle weakness (generalized) (M62.81)   Activity Tolerance Patient tolerated treatment well   Patient Left in bed;Other (comment) (sitting on EOB)   Nurse Communication Mobility status        Time: 4854-6270 OT Time Calculation (min): 17 min  Charges: OT General Charges $OT Visit: 1 Visit OT Treatments $Self Care/Home Management : 8-22 mins  Alfonse Flavors, OTA Acute Rehabilitation Services  Pager [phone] Office [phone]   Dewain Penning 06/08/2021, 10:33 AM

## 2021-06-08 NOTE — Progress Notes (Signed)
Progress Note  Patient Name: Alejandro Santos Date of Encounter: 06/08/2021  Primary Cardiologist: None   Subjective   Patient was seen examined at his bedside.  He was lying bed when I arrived.  Inpatient Medications    Scheduled Meds:  amLODipine  10 mg Oral Daily   enoxaparin (LOVENOX) injection  40 mg Subcutaneous Daily   losartan  100 mg Oral Daily   Continuous Infusions:  sodium chloride Stopped (06/08/21 1023)   PRN Meds: acetaminophen **OR** acetaminophen, diphenhydrAMINE, hydrALAZINE, ondansetron **OR** ondansetron (ZOFRAN) IV, polyethylene glycol   Vital Signs    Vitals:   06/08/21 0325 06/08/21 0700 06/08/21 0746 06/08/21 1030  BP: (!) 156/111  (!) 179/119 (!) 178/125  Pulse: 83  93 92  Resp: 18  15 20   Temp: 98.4 F (36.9 C) 98.2 F (36.8 C) 98.2 F (36.8 C)   TempSrc: Oral Oral Oral   SpO2: 93%  96% 98%  Weight:      Height:        Intake/Output Summary (Last 24 hours) at 06/08/2021 1152 Last data filed at 06/08/2021 1023 Gross per 24 hour  Intake 785.51 ml  Output --  Net 785.51 ml   Filed Weights   06/05/21 1124  Weight: 127 kg    Telemetry    Sinus rhythm- Personally Reviewed  ECG    None today- Personally Reviewed  Physical Exam    General: Comfortable, sitting up in a chair Head: Atraumatic, normal size  Eyes: PEERLA, EOMI  Neck: Supple, normal JVD Cardiac: Normal S1, S2; RRR; no murmurs, rubs, or gallops Lungs: Clear to auscultation bilaterally Abd: Soft, nontender, no hepatomegaly  Ext: warm, no edema Musculoskeletal: No deformities, BUE and BLE strength normal and equal Skin: Warm and dry, no rashes   Neuro: Alert and oriented to person, place, time, and situation, CNII-XII grossly intact, no focal deficits  Psych: Normal mood and affect   Labs    Chemistry Recent Labs  Lab 06/06/21 0403 06/07/21 0144 06/08/21 0258  NA 137 137 137  K 3.6 3.5 3.3*  CL 97* 99 102  CO2 29 27 26   GLUCOSE 105* 98 108*  BUN 19 36*  27*  CREATININE 2.01* 2.07* 1.69*  CALCIUM 8.6* 9.0 8.9  PROT 7.3  --   --   ALBUMIN 3.8  --   --   AST 24  --   --   ALT 27  --   --   ALKPHOS 51  --   --   BILITOT 0.9  --   --   GFRNONAA 42* 41* 52*  ANIONGAP 11 11 9      Hematology Recent Labs  Lab 06/05/21 1139 06/06/21 0403  WBC 9.6 9.3  RBC 5.38 5.03  HGB 15.5 14.5  HCT 46.3 43.9  MCV 86.1 87.3  MCH 28.8 28.8  MCHC 33.5 33.0  RDW 13.4 13.7  PLT 409* 406*    Cardiac EnzymesNo results for input(s): TROPONINI in the last 168 hours. No results for input(s): TROPIPOC in the last 168 hours.   BNPNo results for input(s): BNP, PROBNP in the last 168 hours.   DDimer No results for input(s): DDIMER in the last 168 hours.   Radiology    MR CERVICAL SPINE WO CONTRAST  Result Date: 06/06/2021 CLINICAL DATA:  Chronic myelopathy cervical spine. EXAM: MRI CERVICAL SPINE WITHOUT CONTRAST TECHNIQUE: Multiplanar, multisequence MR imaging of the cervical spine was performed. No intravenous contrast was administered. COMPARISON:  None. FINDINGS: Alignment: Normal Vertebrae:  Normal bone marrow.  Negative for fracture or mass Cord: Normal signal and morphology Posterior Fossa, vertebral arteries, paraspinal tissues: Nonpathologic lymph nodes in the neck bilaterally. No mass or soft tissue edema. Disc levels: C2-3: Negative C3-4: Negative C4-5: Mild disc degeneration. Mild disc bulging without significant stenosis. C5-6: Mild disc bulging.  Negative for stenosis C6-7: Negative C7-T1: Mild facet degeneration. Moderate left foraminal narrowing. Mild right foraminal narrowing. IMPRESSION: Mild degenerative changes cervical spine. Foraminal narrowing bilaterally at C7-T1 left greater than right. Normal cord signal. Electronically Signed   By: Marlan Palau M.D.   On: 06/06/2021 15:05    Cardiac Studies   ECHO: 06/06/2021  1. Left ventricular ejection fraction, by estimation, is 55 to 60%. The  left ventricle has normal function. The left  ventricle has no regional  wall motion abnormalities. There is severe concentric left ventricular  hypertrophy. Left ventricular diastolic  parameters are consistent with Grade I diastolic dysfunction (impaired relaxation). The average left ventricular global longitudinal strain is -16.9 %. The global longitudinal strain is abnormal.   2. Right ventricular systolic function is normal. The right ventricular  size is normal. Tricuspid regurgitation signal is inadequate for assessing PA pressure.   3. The mitral valve is normal in structure. No evidence of mitral valve regurgitation. No evidence of mitral stenosis.   4. The aortic valve is tricuspid. Aortic valve regurgitation is not  visualized. No aortic stenosis is present.   5. Aortic dilatation noted. 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   6. The inferior vena cava is normal in size with greater than 50%  respiratory variability, suggesting right atrial pressure of 3 mmHg.   7. Negative bubble study, no evidence for PFO or ASD.   Patient Profile     41 y.o. male history of hypertension, morbid obesity.  Assessment & Plan    Sinus bradycardia with insignificant 5 beats which I suspect highly this is vagally mediated given the fact that the patient was always sleeping when this happened.  Suspect he may have sleep apnea.  There is no indication here for pacemaker.  I have asked the patient to follow-up in the outpatient setting so we can monitor him closely. At that time we will also get him set up with a sleep study.  He still has hypertensive continue amlodipine, losartan has been added he does have some elevated creatinine which I think is in the setting of chronic kidney disease given his significant hypertension.  He echo does show evidence of end organ damage with severe LVH.  I spoke with the patient in great length.  He is very young and I explained to him that he needs to be very serious about treating  his hypertension as now he does have endorgan damage with his LVH he does have some chronic kidney disease this could potentially lead to chronic renal failure subsequently possible dialysis.  I also explained to him another reason why people have stroke is giving elevated her blood pressure.  I talked to him about his diet.  He seems ready for change.  He plans to follow-up in the outpatient setting.  [facility] will sign off.   Medication Recommendations: Amlodipine 10 mg daily, losartan 100 mg daily, Other recommendations (labs, testing, etc): May need sleep study in the outpatient setting Follow up as an outpatient: Follow-up with cardiology  For questions or updates, please contact [facility] Please consult [email or web address] for contact info under Cardiology/STEMI.  Signed, Thomasene Ripple, DO  06/08/2021, 11:52 AM

## 2021-06-08 NOTE — Progress Notes (Signed)
Pt discharged home. Pt alert and oriented x4 in no acute distress upon discharge. Pt has taken all of his belongings. Pt's sister transported home via private vehicle.

## 2021-06-08 NOTE — Discharge Summary (Signed)
PATIENT DETAILS Name: Alejandro Santos Age: 41 y.o. Sex: male Date of Birth: 1980-06-11 MRN: [id number]. Admitting Physician: Elmarie Shiley, MD [id number], [name], PA-C (Inactive)  Admit Date: 06/05/2021 Discharge date: 06/08/2021  Recommendations for Outpatient Follow-up:  Follow up with PCP in 1-2 weeks Please obtain CMP/CBC in one week Please ensure follow-up with cardiology Needs outpatient sleep study Avoid beta-blockers/rate controlling agents due to significant sinus pauses/transient second-degree heart block.  Admitted From:  Home  Disposition: Home   Discharge Condition: good  CODE STATUS:   Code Status: Full Code   Diet recommendation:  Diet Order             Diet - low sodium heart healthy           Diet Heart Room service appropriate? Yes; Fluid consistency: Thin  Diet effective now                    Brief Summary: Patient is a 41 y.o.  male with history of HTN-noncompliant to medications (ran out of medications approximately 2-3 weeks back)-presented to the hospital with chest pain, right index finger/thumb tingling/numbness-patient was found to have hypertensive crisis-and admitted to the hospitalist service.   Significant Hospital events: 2/13>> chest discomfort-evaluated at a local urgent care-thought to have atypical chest pain and discharged home. 2/14>> [facility] for tingling numbness of right hand, nausea/vomiting and intermittent chest pain.  Admitted to [facility] 2/16>> 9.4-second sinus pause overnight, transient type II second-degree heart block.  Evaluated by cardiology-thought to be due to OSA   Significant imaging studies: 2/14>> CTA head/neck: Patent vasculature of the head and neck 2/14>> CTA chest/abdomen/pelvis: No dissection-no acute findings in the chest/abdomen/pelvis. 2/15>> MRI brain: No acute CVA 2/15>> MRI C-spine: Mild degenerative changes 2/15>> Echo: EF 55-60%, no regional wall motion  abnormalities, grade 1 diastolic dysfunction   Significant microbiology data: 2/15>> COVID/influenza PCR: Negative   Procedures: None   Consults:  Cardiology  Brief Hospital Course: Hypertensive emergency: BP overall better but still fluctuating quite a bit-continue amlodipine-we will increase losartan to 100 mg on discharge.  Further optimization will be done in the outpatient setting.     Atypical chest pain: Resolved-EKG/troponins negative.  Echo with preserved EF and without any wall motion abnormalities.  No further recommendations from cardiology-further work-up if deemed necessary can be pursued in the outpatient setting.   Right thumb/index finger tingling/numbness: these symptoms have completely resolved-suspect this may have been from uncontrolled hypertension.  MRI brain was negative for CVA.  MRI C-spine did show some mild degenerative disc disease without any significant cord pathology/myelopathy/radiculopathy.  If symptoms recur-May be worth pursuing outpatient EMG/nerve conduction studies  9.45-second sinus pause/secondary heart block: Occurred on 2/16-this occurred when patient was sleeping-and had been started on a beta-blocker the day prior.  Evaluated by cardiology-thought to have underlying OSA likely contributing.  Continue to hold beta-blocker on discharge-discussed with Dr. Lurena Joiner today-okay to discharge-outpatient sleep study and follow-up will be arranged by cardiology.  TSH was stable.  Please continue to avoid beta-blocker and other rate control agents in the future.   AKI on CKD stage IIIa: AKI likely hemodynamically mediated due to better blood pressure control, recent nausea/vomiting.  HCTZ/losartan was held-which is supportive care-creatinine has improved and now back to baseline.  Continue to watch closely.    Noncompliance to medications:counseled.  Probable undiagnosed OSA: Likely contributing to his HTN/sinus pauses.  Discussed with  cardiologist-Dr. Clydene Fake today-outpatient sleep study will be arranged.  Patient aware for the need to lose weight.  HLD: Start statin-recheck lipid panel in 3 months.   Morbid Obesity: Estimated body mass index is 42.57 kg/m as calculated from the following:   Height as of this encounter: 5\' 8"  (1.727 m).   Weight as of this encounter: 127 kg.    Discharge Diagnoses:  Principal Problem:   Hypertensive emergency Active Problems:   Numbness and tingling of right arm   Atypical chest pain   Discharge Instructions:  Activity:  As tolerated   Discharge Instructions     Call MD for:  extreme fatigue   Complete by: As directed    Call MD for:  persistant dizziness or light-headedness   Complete by: As directed    Call MD for:  severe uncontrolled pain   Complete by: As directed    Diet - low sodium heart healthy   Complete by: As directed    Discharge instructions   Complete by: As directed    Follow with Primary MD  Chrismon, [name], PA-C (Inactive) in 1-2 weeks  Cardiology office cardiology office will call you with a follow-up appointment-please follow-up as instructed.  You will need a outpatient sleep study-please ask your primary cardiologist or your primary care practitioner to arrange for it.  Please take your medications as prescribed.  Please get a complete blood count and chemistry panel checked by your Primary MD at your next visit, and again as instructed by your Primary MD.  Get Medicines reviewed and adjusted: Please take all your medications with you for your next visit with your Primary MD  Laboratory/radiological data: Please request your Primary MD to go over all hospital tests and procedure/radiological results at the follow up, please ask your Primary MD to get all Hospital records sent to his/her office.  In some cases, they will be blood work, cultures and biopsy results pending at the time of your discharge. Please request that your primary care  M.D. follows up on these results.  Also Note the following: If you experience worsening of your admission symptoms, develop shortness of breath, life threatening emergency, suicidal or homicidal thoughts you must seek medical attention immediately by calling 911 or calling your MD immediately  if symptoms less severe.  You must read complete instructions/literature along with all the possible adverse reactions/side effects for all the Medicines you take and that have been prescribed to you. Take any new Medicines after you have completely understood and accpet all the possible adverse reactions/side effects.   Do not drive when taking Pain medications or sleeping medications (Benzodaizepines)  Do not take more than prescribed Pain, Sleep and Anxiety Medications. It is not advisable to combine anxiety,sleep and pain medications without talking with your primary care practitioner  Special Instructions: If you have smoked or chewed Tobacco  in the last 2 yrs please stop smoking, stop any regular Alcohol  and or any Recreational drug use.  Wear Seat belts while driving.  Please note: You were cared for by a hospitalist during your hospital stay. Once you are discharged, your primary care physician will handle any further medical issues. Please note that NO REFILLS for any discharge medications will be authorized once you are discharged, as it is imperative that you return to your primary care physician (or establish a relationship with a primary care physician if you do not have one) for your post hospital discharge needs so that they can reassess your need for medications and monitor your lab values.  Increase activity slowly   Complete by: As directed       Allergies as of 06/08/2021   No Known Allergies      Medication List     STOP taking these medications    diphenhydramine-acetaminophen 25-500 MG Tabs tablet Commonly known as: TYLENOL PM   lisinopril-hydrochlorothiazide 20-25 MG  tablet Commonly known as: ZESTORETIC   metoprolol succinate 50 MG 24 hr tablet Commonly known as: TOPROL-XL   pregabalin 50 MG capsule Commonly known as: LYRICA       TAKE these medications    amLODipine 10 MG tablet Commonly known as: NORVASC Take 1 tablet (10 mg total) by mouth daily. Start taking on: June 09, 2021   atorvastatin 20 MG tablet Commonly known as: Lipitor Take 1 tablet (20 mg total) by mouth daily.   losartan 100 MG tablet Commonly known as: COZAAR Take 1 tablet (100 mg total) by mouth daily.        Follow-up Information     Chrismon, [name], PA-C Follow up.   Specialty: Family Medicine        Tobb, Kardie, DO Follow up.   Specialty: Cardiology Why: Office will call with date/time, If you dont hear from them,please give them a call Contact information: [address] [place] [address] [phone]                No Known Allergies   Other Procedures/Studies: CT ANGIO HEAD NECK W WO CM  Result Date: 06/05/2021 CLINICAL DATA:  Chest pain, hematemesis, neuro deficit EXAM: CT ANGIOGRAPHY HEAD AND NECK TECHNIQUE: Multidetector CT imaging of the head and neck was performed using the standard protocol during bolus administration of intravenous contrast. Multiplanar CT image reconstructions and MIPs were obtained to evaluate the vascular anatomy. Carotid stenosis measurements (when applicable) are obtained utilizing NASCET criteria, using the distal internal carotid diameter as the denominator. RADIATION DOSE REDUCTION: This exam was performed according to the departmental dose-optimization program which includes automated exposure control, adjustment of the mA and/or kV according to patient size and/or use of iterative reconstruction technique. CONTRAST:  110mL OMNIPAQUE IOHEXOL 350 MG/ML SOLN COMPARISON:  CT head 11/01/2016 FINDINGS: CT HEAD FINDINGS Brain: There is no evidence of acute intracranial hemorrhage, extra-axial  fluid collection, or acute infarct. Parenchymal volume is normal. The ventricles are normal in size. Gray-white differentiation is preserved. There is no mass lesion.  There is no midline shift. Vascular: See below. Skull: Normal. Negative for fracture or focal lesion. Sinuses and orbits: The paranasal sinuses are clear. The globes and orbits are unremarkable. Other: None. Review of the MIP images confirms the above findings CTA NECK FINDINGS Aortic arch: The aortic arch is normal. There is a common origin of the brachiocephalic and left common carotid arteries, a normal variant. The origins of the major branch vessels are patent. The subclavian arteries are patent. Right carotid system: The right common, internal common external carotid arteries are patent, without hemodynamically significant stenosis or occlusion. There is no dissection or aneurysm. Left carotid system: The left common, internal, and external carotid arteries are patent, without hemodynamically significant stenosis or occlusion. There is no dissection or aneurysm. Vertebral arteries: A portion of the proximal V2 segments are suboptimally evaluated due to streak artifact from surrounding venous enhancement, but are likely patent. The remainder of the vertebral arteries are widely patent. There is no evidence of dissection or aneurysm. Skeleton: There is mild degenerative change at C4-C5 through C6-C7. There is no acute osseous  abnormality or aggressive osseous lesion. There is no visible canal hematoma. Other neck: Numerous dental caries are noted. The soft tissues of the neck are unremarkable. Upper chest: The imaged lung apices are clear. The lungs are assessed in full on the separately dictated CTA chest/abdomen/pelvis. Review of the MIP images confirms the above findings CTA HEAD FINDINGS Anterior circulation: The intracranial ICAs are patent. The bilateral MCAs are patent. The bilateral ACAs are patent. There is no aneurysm or AVM. Posterior  circulation: The bilateral V4 segments are patent. PICA is identified bilaterally. The basilar artery is patent. The bilateral PCAs are patent. Bilateral posterior communicating arteries are identified. There is no aneurysm or AVM. Venous sinuses: Patent. Anatomic variants: None. Review of the MIP images confirms the above findings IMPRESSION: 1. No acute intracranial pathology. 2. Patent vasculature of the head and neck. Electronically Signed   By: Valetta Mole M.D.   On: 06/05/2021 15:40   DG Chest 2 View  Result Date: 06/05/2021 CLINICAL DATA:  Chest pain EXAM: CHEST - 2 VIEW COMPARISON:  Chest x-ray 05/01/2015 FINDINGS: Heart size upper normal. Mediastinal contours are within normal limits. No suspicious pulmonary opacities identified. No pleural effusion or pneumothorax visualized. No acute osseous abnormality appreciated. IMPRESSION: No acute intrathoracic process identified. Electronically Signed   By: Ofilia Neas M.D.   On: 06/05/2021 12:08   MR BRAIN WO CONTRAST  Result Date: 06/06/2021 CLINICAL DATA:  41 year old male with chest pain, hematemesis, neurologic deficit. EXAM: MRI HEAD WITHOUT CONTRAST TECHNIQUE: Multiplanar, multiecho pulse sequences of the brain and surrounding structures were obtained without intravenous contrast. COMPARISON:  CT head and CTA head and neck yesterday. FINDINGS: Brain: No restricted diffusion to suggest acute infarction. No midline shift, mass effect, evidence of mass lesion, ventriculomegaly, extra-axial collection or acute intracranial hemorrhage. Cervicomedullary junction and pituitary are within normal limits. Patchy periventricular, periatrial white matter T2 and FLAIR hyperintensity is nonspecific. Minimal additional mostly central white matter T2 and FLAIR heterogeneity. No cortical encephalomalacia or chronic cerebral blood products identified. Deep gray matter nuclei, brainstem and cerebellum appear normal. Vascular: Major intracranial vascular flow  voids are preserved. Skull and upper cervical spine: Negative visible cervical spine. Visualized bone marrow signal is within normal limits. Sinuses/Orbits: Disconjugate gaze but otherwise negative orbits. Paranasal sinuses and mastoids are stable and well aerated. Other: Grossly normal visible internal auditory structures. Negative visible scalp and face. IMPRESSION: 1. No acute intracranial abnormality. 2. Mildly to moderately age advanced but nonspecific cerebral white matter signal changes. Electronically Signed   By: Genevie Ann M.D.   On: 06/06/2021 06:41   MR CERVICAL SPINE WO CONTRAST  Result Date: 06/06/2021 CLINICAL DATA:  Chronic myelopathy cervical spine. EXAM: MRI CERVICAL SPINE WITHOUT CONTRAST TECHNIQUE: Multiplanar, multisequence MR imaging of the cervical spine was performed. No intravenous contrast was administered. COMPARISON:  None. FINDINGS: Alignment: Normal Vertebrae: Normal bone marrow.  Negative for fracture or mass Cord: Normal signal and morphology Posterior Fossa, vertebral arteries, paraspinal tissues: Nonpathologic lymph nodes in the neck bilaterally. No mass or soft tissue edema. Disc levels: C2-3: Negative C3-4: Negative C4-5: Mild disc degeneration. Mild disc bulging without significant stenosis. C5-6: Mild disc bulging.  Negative for stenosis C6-7: Negative C7-T1: Mild facet degeneration. Moderate left foraminal narrowing. Mild right foraminal narrowing. IMPRESSION: Mild degenerative changes cervical spine. Foraminal narrowing bilaterally at C7-T1 left greater than right. Normal cord signal. Electronically Signed   By: Franchot Gallo M.D.   On: 06/06/2021 15:05   ECHOCARDIOGRAM COMPLETE BUBBLE STUDY  Result  Date: 06/06/2021    ECHOCARDIOGRAM REPORT   Patient Name:   Alejandro Santos Date of Exam: 06/06/2021 Medical Rec #:  [id number]       Height:       68.0 in Accession #:    [id number]      Weight:       280.0 lb Date of Birth:  [birth year]       BSA:          2.358 m Patient Age:     40 years        BP:           144/100 mmHg Patient Gender: M               HR:           81 bpm. Exam Location:  Inpatient Procedure: 2D Echo, 3D Echo, Cardiac Doppler, Color Doppler, Strain Analysis and            Saline Contrast Bubble Study Indications:    Stroke  History:        Patient has no prior history of Echocardiogram examinations.                 Signs/Symptoms:Chest Pain and Dizziness/Lightheadedness; Risk                 Factors:Hypertension.  Sonographer:    Sheralyn Boatman RDCS Referring Phys: [id number] Deno Lunger [facility]  Sonographer Comments: Technically difficult study due to poor echo windows and patient is morbidly obese. Image acquisition challenging due to patient body habitus. IMPRESSIONS  1. Left ventricular ejection fraction, by estimation, is 55 to 60%. The left ventricle has normal function. The left ventricle has no regional wall motion abnormalities. There is severe concentric left ventricular hypertrophy. Left ventricular diastolic  parameters are consistent with Grade I diastolic dysfunction (impaired relaxation). The average left ventricular global longitudinal strain is -16.9 %. The global longitudinal strain is abnormal.  2. Right ventricular systolic function is normal. The right ventricular size is normal. Tricuspid regurgitation signal is inadequate for assessing PA pressure.  3. The mitral valve is normal in structure. No evidence of mitral valve regurgitation. No evidence of mitral stenosis.  4. The aortic valve is tricuspid. Aortic valve regurgitation is not visualized. No aortic stenosis is present.  5. Aortic dilatation noted. There is mild dilatation of the ascending aorta, measuring 37 mm.  6. The inferior vena cava is normal in size with greater than 50% respiratory variability, suggesting right atrial pressure of 3 mmHg.  7. Negative bubble study, no evidence for PFO or ASD. FINDINGS  Left Ventricle: Left ventricular ejection fraction, by estimation, is 55 to 60%. The left  ventricle has normal function. The left ventricle has no regional wall motion abnormalities. The average left ventricular global longitudinal strain is -16.9 %. The global longitudinal strain is abnormal. The left ventricular internal cavity size was normal in size. There is severe concentric left ventricular hypertrophy. Left ventricular diastolic parameters are consistent with Grade I diastolic dysfunction (impaired relaxation). Right Ventricle: The right ventricular size is normal. No increase in right ventricular wall thickness. Right ventricular systolic function is normal. Tricuspid regurgitation signal is inadequate for assessing PA pressure. Left Atrium: Left atrial size was normal in size. Right Atrium: Right atrial size was normal in size. Pericardium: There is no evidence of pericardial effusion. Mitral Valve: The mitral valve is normal in structure. No evidence of mitral valve regurgitation. No evidence of mitral valve stenosis. Tricuspid Valve:  The tricuspid valve is normal in structure. Tricuspid valve regurgitation is not demonstrated. Aortic Valve: The aortic valve is tricuspid. Aortic valve regurgitation is not visualized. No aortic stenosis is present. Pulmonic Valve: The pulmonic valve was normal in structure. Pulmonic valve regurgitation is not visualized. Aorta: The aortic root is normal in size and structure and aortic dilatation noted. There is mild dilatation of the ascending aorta, measuring 37 mm. Venous: The inferior vena cava is normal in size with greater than 50% respiratory variability, suggesting right atrial pressure of 3 mmHg. IAS/Shunts: Negative bubble study, no evidence for PFO or ASD. Agitated saline contrast was given intravenously to evaluate for intracardiac shunting.  LEFT VENTRICLE PLAX 2D LVIDd:         4.20 cm LVIDs:         3.10 cm      2D Longitudinal Strain LV PW:         2.85 cm      2D Strain GLS Avg:     -16.9 % LV IVS:        2.00 cm LVOT diam:     2.30 cm LV SV:          69 LV SV Index:   29           3D Volume EF: LVOT Area:     4.15 cm     3D EF:        50 %                             LV EDV:       233 ml                             LV ESV:       115 ml LV Volumes (MOD)            LV SV:        117 ml LV vol d, MOD A2C: 150.0 ml LV vol d, MOD A4C: 131.0 ml LV vol s, MOD A2C: 76.2 ml LV vol s, MOD A4C: 62.7 ml LV SV MOD A2C:     73.8 ml LV SV MOD A4C:     131.0 ml LV SV MOD BP:      73.2 ml RIGHT VENTRICLE             IVC RV S prime:     12.00 cm/s  IVC diam: 1.10 cm TAPSE (M-mode): 2.2 cm LEFT ATRIUM             Index        RIGHT ATRIUM           Index LA diam:        2.60 cm 1.10 cm/m   RA Area:     16.60 cm LA Vol (A2C):   30.8 ml 13.06 ml/m  RA Volume:   47.30 ml  20.06 ml/m LA Vol (A4C):   40.2 ml 17.05 ml/m LA Biplane Vol: 36.0 ml 15.27 ml/m  AORTIC VALVE LVOT Vmax:   100.00 cm/s LVOT Vmean:  64.500 cm/s LVOT VTI:    0.167 m  AORTA Ao Root diam: 3.60 cm Ao Asc diam:  3.70 cm MITRAL VALVE MV Area (PHT): 3.68 cm    SHUNTS MV Decel Time: 206 msec    Systemic VTI:  0.17 m MV E velocity: 42.45 cm/s  Systemic  Diam: 2.30 cm MV A velocity: 74.95 cm/s MV E/A ratio:  0.57 Dalton McleanMD Electronically signed by Franki Monte Signature Date/Time: 06/06/2021/4:22:19 PM    Final    CT Angio Chest/Abd/Pel for Dissection W and/or Wo Contrast  Result Date: 06/05/2021 CLINICAL DATA:  Acute aortic syndrome. Chest pain and vomiting blood. EXAM: CT ANGIOGRAPHY CHEST, ABDOMEN AND PELVIS TECHNIQUE: Non-contrast CT of the chest was initially obtained. Multidetector CT imaging through the chest, abdomen and pelvis was performed using the standard protocol during bolus administration of intravenous contrast. Multiplanar reconstructed images and MIPs were obtained and reviewed to evaluate the vascular anatomy. RADIATION DOSE REDUCTION: This exam was performed according to the departmental dose-optimization program which includes automated exposure control, adjustment of the mA  and/or kV according to patient size and/or use of iterative reconstruction technique. CONTRAST:  148mL OMNIPAQUE IOHEXOL 350 MG/ML SOLN COMPARISON:  CT stone study 09/15/2016 FINDINGS: CTA CHEST FINDINGS Cardiovascular: Pre contrast imaging shows no hyperdense crescent in the wall of the thoracic aorta to suggest acute intramural hematoma. Imaging after IV contrast administration shows no thoracic aortic aneurysm with ascending thoracic aorta measuring approximately 3.5 cm diameter. No dissection of the thoracic aorta. Bovine arch vessel anatomy evident, normal variant. Arch vessel anatomy is widely patent. Heart size upper normal. No pericardial effusion. Mediastinum/Nodes: No mediastinal lymphadenopathy. There is no hilar lymphadenopathy. The esophagus has normal imaging features. There is no axillary lymphadenopathy. Lungs/Pleura: No suspicious pulmonary nodule or mass. No focal airspace consolidation. There is no evidence of pleural effusion. Musculoskeletal: No worrisome lytic or sclerotic osseous abnormality. Review of the MIP images confirms the above findings. CTA ABDOMEN AND PELVIS FINDINGS VASCULAR Aorta: Normal caliber aorta without aneurysm, dissection, vasculitis or significant stenosis. Celiac: Patent without evidence of aneurysm, dissection, vasculitis or significant stenosis. SMA: Patent without evidence of aneurysm, dissection, vasculitis or significant stenosis. Renals: Both renal arteries are patent without evidence of aneurysm, dissection, vasculitis, fibromuscular dysplasia or significant stenosis. IMA: Patent without evidence of aneurysm, dissection, vasculitis or significant stenosis. Inflow: Patent without evidence of aneurysm, dissection, vasculitis or significant stenosis. Veins: No obvious venous abnormality within the limitations of this arterial phase study. Review of the MIP images confirms the above findings. NON-VASCULAR Hepatobiliary: No suspicious focal abnormality within the liver  parenchyma. There is no evidence for gallstones, gallbladder wall thickening, or pericholecystic fluid. No intrahepatic or extrahepatic biliary dilation. Pancreas: No focal mass lesion. No dilatation of the main duct. No intraparenchymal cyst. No peripancreatic edema. Spleen: No splenomegaly. No focal mass lesion. Adrenals/Urinary Tract: No adrenal nodule or mass. Kidneys unremarkable. No evidence for hydroureter. The urinary bladder appears normal for the degree of distention. Stomach/Bowel: Stomach is unremarkable. No gastric wall thickening. No evidence of outlet obstruction. Duodenum is normally positioned as is the ligament of Treitz. No small bowel wall thickening. No small bowel dilatation. The terminal ileum is normal. Mild distension noted mid appendix with no periappendiceal edema or inflammation. No gross colonic mass. No colonic wall thickening. Lymphatic: There is no gastrohepatic or hepatoduodenal ligament lymphadenopathy. No retroperitoneal or mesenteric lymphadenopathy. No pelvic sidewall lymphadenopathy. Reproductive: The prostate gland and seminal vesicles are unremarkable. Other: No intraperitoneal free fluid. Musculoskeletal: No worrisome lytic or sclerotic osseous abnormality. Review of the MIP images confirms the above findings. IMPRESSION: 1. No thoracoabdominal aortic aneurysm or dissection. No evidence for acute intramural hematoma of the thoracic aorta. 2. No acute findings in the chest, abdomen, or pelvis to explain the patient's history of chest pain and vomiting. Electronically Signed  By: Misty Stanley M.D.   On: 06/05/2021 15:47     TODAY-DAY OF DISCHARGE:  Subjective:   Ale[place] Santos today has no headache,no chest abdominal pain,no new weakness tingling or numbness, feels much better wants to go home today.  Objective:   Blood pressure (!) 179/119, pulse 93, temperature 98.2 F (36.8 C), temperature source Oral, resp. rate 15, height 5\' 8"  (1.727 m), weight 127 kg, SpO2  96 %.  Intake/Output Summary (Last 24 hours) at 06/08/2021 0933 Last data filed at 06/08/2021 0400 Gross per 24 hour  Intake 913.35 ml  Output --  Net 913.35 ml   Filed Weights   06/05/21 1124  Weight: 127 kg    Exam: Awake Alert, Oriented *3, No new F.N deficits, Normal affect [place].AT,PERRAL Supple Neck,No JVD, No cervical lymphadenopathy appriciated.  Symmetrical Chest wall movement, Good air movement bilaterally, CTAB RRR,No Gallops,Rubs or new Murmurs, No Parasternal Heave +ve B.Sounds, Abd Soft, Non tender, No organomegaly appriciated, No rebound -guarding or rigidity. No Cyanosis, Clubbing or edema, No new Rash or bruise   PERTINENT RADIOLOGIC STUDIES: MR CERVICAL SPINE WO CONTRAST  Result Date: 06/06/2021 CLINICAL DATA:  Chronic myelopathy cervical spine. EXAM: MRI CERVICAL SPINE WITHOUT CONTRAST TECHNIQUE: Multiplanar, multisequence MR imaging of the cervical spine was performed. No intravenous contrast was administered. COMPARISON:  None. FINDINGS: Alignment: Normal Vertebrae: Normal bone marrow.  Negative for fracture or mass Cord: Normal signal and morphology Posterior Fossa, vertebral arteries, paraspinal tissues: Nonpathologic lymph nodes in the neck bilaterally. No mass or soft tissue edema. Disc levels: C2-3: Negative C3-4: Negative C4-5: Mild disc degeneration. Mild disc bulging without significant stenosis. C5-6: Mild disc bulging.  Negative for stenosis C6-7: Negative C7-T1: Mild facet degeneration. Moderate left foraminal narrowing. Mild right foraminal narrowing. IMPRESSION: Mild degenerative changes cervical spine. Foraminal narrowing bilaterally at C7-T1 left greater than right. Normal cord signal. Electronically Signed   By: Franchot Gallo M.D.   On: 06/06/2021 15:05   ECHOCARDIOGRAM COMPLETE BUBBLE STUDY  Result Date: 06/06/2021    ECHOCARDIOGRAM REPORT   Patient Name:   [place] Santos Date of Exam: 06/06/2021 Medical Rec #:  [id number]       Height:       68.0 in  Accession #:    [id number]      Weight:       280.0 lb Date of Birth:  1980/09/06       BSA:          2.358 m Patient Age:    45 years        BP:           144/100 mmHg Patient Gender: M               HR:           81 bpm. Exam Location:  Inpatient Procedure: 2D Echo, 3D Echo, Cardiac Doppler, Color Doppler, Strain Analysis and            Saline Contrast Bubble Study Indications:    Stroke  History:        Patient has no prior history of Echocardiogram examinations.                 Signs/Symptoms:Chest Pain and Dizziness/Lightheadedness; Risk                 Factors:Hypertension.  Sonographer:    Roseanna Rainbow RDCS Referring Phys: [id number] [place]  Sonographer Comments: Technically difficult study due to poor echo windows and  patient is morbidly obese. Image acquisition challenging due to patient body habitus. IMPRESSIONS  1. Left ventricular ejection fraction, by estimation, is 55 to 60%. The left ventricle has normal function. The left ventricle has no regional wall motion abnormalities. There is severe concentric left ventricular hypertrophy. Left ventricular diastolic  parameters are consistent with Grade I diastolic dysfunction (impaired relaxation). The average left ventricular global longitudinal strain is -16.9 %. The global longitudinal strain is abnormal.  2. Right ventricular systolic function is normal. The right ventricular size is normal. Tricuspid regurgitation signal is inadequate for assessing PA pressure.  3. The mitral valve is normal in structure. No evidence of mitral valve regurgitation. No evidence of mitral stenosis.  4. The aortic valve is tricuspid. Aortic valve regurgitation is not visualized. No aortic stenosis is present.  5. Aortic dilatation noted. There is mild dilatation of the ascending aorta, measuring 37 mm.  6. The inferior vena cava is normal in size with greater than 50% respiratory variability, suggesting right atrial pressure of 3 mmHg.  7. Negative bubble study, no  evidence for PFO or ASD. FINDINGS  Left Ventricle: Left ventricular ejection fraction, by estimation, is 55 to 60%. The left ventricle has normal function. The left ventricle has no regional wall motion abnormalities. The average left ventricular global longitudinal strain is -16.9 %. The global longitudinal strain is abnormal. The left ventricular internal cavity size was normal in size. There is severe concentric left ventricular hypertrophy. Left ventricular diastolic parameters are consistent with Grade I diastolic dysfunction (impaired relaxation). Right Ventricle: The right ventricular size is normal. No increase in right ventricular wall thickness. Right ventricular systolic function is normal. Tricuspid regurgitation signal is inadequate for assessing PA pressure. Left Atrium: Left atrial size was normal in size. Right Atrium: Right atrial size was normal in size. Pericardium: There is no evidence of pericardial effusion. Mitral Valve: The mitral valve is normal in structure. No evidence of mitral valve regurgitation. No evidence of mitral valve stenosis. Tricuspid Valve: The tricuspid valve is normal in structure. Tricuspid valve regurgitation is not demonstrated. Aortic Valve: The aortic valve is tricuspid. Aortic valve regurgitation is not visualized. No aortic stenosis is present. Pulmonic Valve: The pulmonic valve was normal in structure. Pulmonic valve regurgitation is not visualized. Aorta: The aortic root is normal in size and structure and aortic dilatation noted. There is mild dilatation of the ascending aorta, measuring 37 mm. Venous: The inferior vena cava is normal in size with greater than 50% respiratory variability, suggesting right atrial pressure of 3 mmHg. IAS/Shunts: Negative bubble study, no evidence for PFO or ASD. Agitated saline contrast was given intravenously to evaluate for intracardiac shunting.  LEFT VENTRICLE PLAX 2D LVIDd:         4.20 cm LVIDs:         3.10 cm      2D  Longitudinal Strain LV PW:         2.85 cm      2D Strain GLS Avg:     -16.9 % LV IVS:        2.00 cm LVOT diam:     2.30 cm LV SV:         69 LV SV Index:   29           3D Volume EF: LVOT Area:     4.15 cm     3D EF:        50 %  LV EDV:       233 ml                             LV ESV:       115 ml LV Volumes (MOD)            LV SV:        117 ml LV vol d, MOD A2C: 150.0 ml LV vol d, MOD A4C: 131.0 ml LV vol s, MOD A2C: 76.2 ml LV vol s, MOD A4C: 62.7 ml LV SV MOD A2C:     73.8 ml LV SV MOD A4C:     131.0 ml LV SV MOD BP:      73.2 ml RIGHT VENTRICLE             IVC RV S prime:     12.00 cm/s  IVC diam: 1.10 cm TAPSE (M-mode): 2.2 cm LEFT ATRIUM             Index        RIGHT ATRIUM           Index LA diam:        2.60 cm 1.10 cm/m   RA Area:     16.60 cm LA Vol (A2C):   30.8 ml 13.06 ml/m  RA Volume:   47.30 ml  20.06 ml/m LA Vol (A4C):   40.2 ml 17.05 ml/m LA Biplane Vol: 36.0 ml 15.27 ml/m  AORTIC VALVE LVOT Vmax:   100.00 cm/s LVOT Vmean:  64.500 cm/s LVOT VTI:    0.167 m  AORTA Ao Root diam: 3.60 cm Ao Asc diam:  3.70 cm MITRAL VALVE MV Area (PHT): 3.68 cm    SHUNTS MV Decel Time: 206 msec    Systemic VTI:  0.17 m MV E velocity: 42.45 cm/s  Systemic Diam: 2.30 cm MV A velocity: 74.95 cm/s MV E/A ratio:  0.57 Dalton McleanMD Electronically signed by Franki Monte Signature Date/Time: 06/06/2021/4:22:19 PM    Final      PERTINENT LAB RESULTS: CBC: Recent Labs    06/05/21 1139 06/06/21 0403  WBC 9.6 9.3  HGB 15.5 14.5  HCT 46.3 43.9  PLT 409* 406*   CMET CMP     Component Value Date/Time   NA 137 06/08/2021 0258   NA 141 08/02/2019 0908   K 3.3 (L) 06/08/2021 0258   CL 102 06/08/2021 0258   CO2 26 06/08/2021 0258   GLUCOSE 108 (H) 06/08/2021 0258   BUN 27 (H) 06/08/2021 0258   BUN 10 08/02/2019 0908   CREATININE 1.69 (H) 06/08/2021 0258   CALCIUM 8.9 06/08/2021 0258   PROT 7.3 06/06/2021 0403   PROT 7.5 08/02/2019 0908   ALBUMIN 3.8 06/06/2021  0403   ALBUMIN 4.4 08/02/2019 0908   AST 24 06/06/2021 0403   ALT 27 06/06/2021 0403   ALKPHOS 51 06/06/2021 0403   BILITOT 0.9 06/06/2021 0403   BILITOT 0.4 08/02/2019 0908   GFRNONAA 52 (L) 06/08/2021 0258   GFRAA 86 08/02/2019 0908    GFR Estimated Creatinine Clearance: 75.4 mL/min (A) (by C-G formula based on SCr of 1.69 mg/dL (H)). No results for input(s): LIPASE, AMYLASE in the last 72 hours. No results for input(s): CKTOTAL, CKMB, CKMBINDEX, TROPONINI in the last 72 hours. Invalid input(s): POCBNP No results for input(s): DDIMER in the last 72 hours. Recent Labs    06/06/21 0403  HGBA1C 5.6   Recent Labs  06/06/21 0403  CHOL 223*  HDL 45  LDLCALC 151*  TRIG 134  CHOLHDL 5.0   Recent Labs    06/07/21 0144  TSH 1.655   No results for input(s): VITAMINB12, FOLATE, FERRITIN, TIBC, IRON, RETICCTPCT in the last 72 hours. Coags: No results for input(s): INR in the last 72 hours.  Invalid input(s): PT Microbiology: Recent Results (from the past 240 hour(s))  MRSA Next Gen by PCR, Nasal     Status: None   Collection Time: 06/06/21  1:39 AM   Specimen: Nasal Mucosa; Nasal Swab  Result Value Ref Range Status   MRSA by PCR Next Gen NOT DETECTED NOT DETECTED Final    Comment: (NOTE) The GeneXpert MRSA Assay (FDA approved for NASAL specimens only), is one component of a comprehensive MRSA colonization surveillance program. It is not intended to diagnose MRSA infection nor to guide or monitor treatment for MRSA infections. Test performance is not FDA approved in patients less than 34 years old. Performed at [facility], [place] [address], [place], [place] [address]   Resp Panel by RT-PCR (Flu A&B, Covid) Nasopharyngeal Swab     Status: None   Collection Time: 06/06/21  1:48 AM   Specimen: Nasopharyngeal Swab; Nasopharyngeal(NP) swabs in vial transport medium  Result Value Ref Range Status   SARS Coronavirus 2 by RT PCR NEGATIVE NEGATIVE Final    Comment:  (NOTE) SARS-CoV-2 target nucleic acids are NOT DETECTED.  The SARS-CoV-2 RNA is generally detectable in upper respiratory specimens during the acute phase of infection. The lowest concentration of SARS-CoV-2 viral copies this assay can detect is 138 copies/mL. A negative result does not preclude SARS-Cov-2 infection and should not be used as the sole basis for treatment or other patient management decisions. A negative result may occur with  improper specimen collection/handling, submission of specimen other than nasopharyngeal swab, presence of viral mutation(s) within the areas targeted by this assay, and inadequate number of viral copies(<138 copies/mL). A negative result must be combined with clinical observations, patient history, and epidemiological information. The expected result is Negative.  Fact Sheet for Patients:  EntrepreneurPulse.com.au  Fact Sheet for Healthcare Providers:  IncredibleEmployment.be  This test is no t yet approved or cleared by the [place] FDA and  has been authorized for detection and/or diagnosis of SARS-CoV-2 by FDA under an Emergency Use Authorization (EUA). This EUA will remain  in effect (meaning this test can be used) for the duration of the COVID-19 declaration under Section 564(b)(1) of the Act, 21 U.S.C.section 360bbb-3(b)(1), unless the authorization is terminated  or revoked sooner.       Influenza A by PCR NEGATIVE NEGATIVE Final   Influenza B by PCR NEGATIVE NEGATIVE Final    Comment: (NOTE) The Xpert Xpress SARS-CoV-2/FLU/RSV plus assay is intended as an aid in the diagnosis of influenza from Nasopharyngeal swab specimens and should not be used as a sole basis for treatment. Nasal washings and aspirates are unacceptable for Xpert Xpress SARS-CoV-2/FLU/RSV testing.  Fact Sheet for Patients: EntrepreneurPulse.com.au  Fact Sheet for Healthcare  Providers: IncredibleEmployment.be  This test is not yet approved or cleared by the [place] FDA and has been authorized for detection and/or diagnosis of SARS-CoV-2 by FDA under an Emergency Use Authorization (EUA). This EUA will remain in effect (meaning this test can be used) for the duration of the COVID-19 declaration under Section 564(b)(1) of the Act, 21 U.S.C. section 360bbb-3(b)(1), unless the authorization is terminated or revoked.  Performed at [facility], [place] [address], [place], [place] [address]     FURTHER DISCHARGE INSTRUCTIONS:  Get Medicines reviewed and adjusted: Please take all your medications with you for your next visit with your Primary MD  Laboratory/radiological data: Please request your Primary MD to go over all hospital tests and procedure/radiological results at the follow up, please ask your Primary MD to get all Hospital records sent to his/her office.  In some cases, they will be blood work, cultures and biopsy results pending at the time of your discharge. Please request that your primary care M.D. goes through all the records of your hospital data and follows up on these results.  Also Note the following: If you experience worsening of your admission symptoms, develop shortness of breath, life threatening emergency, suicidal or homicidal thoughts you must seek medical attention immediately by calling 911 or calling your MD immediately  if symptoms less severe.  You must read complete instructions/literature along with all the possible adverse reactions/side effects for all the Medicines you take and that have been prescribed to you. Take any new Medicines after you have completely understood and accpet all the possible adverse reactions/side effects.   Do not drive when taking Pain medications or sleeping medications (Benzodaizepines)  Do not take more than prescribed Pain, Sleep and Anxiety Medications. It is not  advisable to combine anxiety,sleep and pain medications without talking with your primary care practitioner  Special Instructions: If you have smoked or chewed Tobacco  in the last 2 yrs please stop smoking, stop any regular Alcohol  and or any Recreational drug use.  Wear Seat belts while driving.  Please note: You were cared for by a hospitalist during your hospital stay. Once you are discharged, your primary care physician will handle any further medical issues. Please note that NO REFILLS for any discharge medications will be authorized once you are discharged, as it is imperative that you return to your primary care physician (or establish a relationship with a primary care physician if you do not have one) for your post hospital discharge needs so that they can reassess your need for medications and monitor your lab values.  Total Time spent coordinating discharge including counseling, education and face to face time equals greater than 30 minutes.  SignedOren Binet 06/08/2021 9:33 AM

## 2021-06-13 ENCOUNTER — Ambulatory Visit (INDEPENDENT_AMBULATORY_CARE_PROVIDER_SITE_OTHER): Payer: 59 | Admitting: Cardiology

## 2021-06-13 ENCOUNTER — Other Ambulatory Visit: Payer: Self-pay

## 2021-06-13 ENCOUNTER — Ambulatory Visit (INDEPENDENT_AMBULATORY_CARE_PROVIDER_SITE_OTHER): Payer: 59

## 2021-06-13 ENCOUNTER — Other Ambulatory Visit (HOSPITAL_COMMUNITY): Payer: Self-pay

## 2021-06-13 ENCOUNTER — Encounter: Payer: Self-pay | Admitting: Cardiology

## 2021-06-13 VITALS — BP 170/110 | HR 113 | Ht 68.0 in | Wt 289.0 lb

## 2021-06-13 DIAGNOSIS — I10 Essential (primary) hypertension: Secondary | ICD-10-CM | POA: Diagnosis not present

## 2021-06-13 DIAGNOSIS — R002 Palpitations: Secondary | ICD-10-CM

## 2021-06-13 DIAGNOSIS — R400 Somnolence: Secondary | ICD-10-CM | POA: Diagnosis not present

## 2021-06-13 DIAGNOSIS — R0683 Snoring: Secondary | ICD-10-CM

## 2021-06-13 MED ORDER — ATORVASTATIN CALCIUM 20 MG PO TABS: 20.0000 mg | ORAL_TABLET | Freq: Every day | ORAL | 11 refills | Status: DC

## 2021-06-13 MED ORDER — AMLODIPINE BESYLATE 10 MG PO TABS: 10.0000 mg | ORAL_TABLET | Freq: Every day | ORAL | 11 refills | Status: DC

## 2021-06-13 MED ORDER — LOSARTAN POTASSIUM 100 MG PO TABS: 100.0000 mg | ORAL_TABLET | Freq: Every day | ORAL | 11 refills | Status: DC

## 2021-06-13 MED ORDER — CARVEDILOL 6.25 MG PO TABS: 6.2500 mg | ORAL_TABLET | Freq: Two times a day (BID) | ORAL | 3 refills | Status: DC

## 2021-06-13 NOTE — Patient Instructions (Addendum)
Medication Instructions:  Your physician has recommended you make the following change in your medication:  START: Coreg 6.25 mg twice daily *If you need a refill on your cardiac medications before your next appointment, please call your pharmacy*   Lab Work: None If you have labs (blood work) drawn today and your tests are completely normal, you will receive your results only by: MyChart Message (if you have MyChart) OR A paper copy in the mail If you have any lab test that is abnormal or we need to change your treatment, we will call you to review the results.   Testing/Procedures: Your physician has recommended that you have a sleep study. This test records several body functions during sleep, including: brain activity, eye movement, oxygen and carbon dioxide blood levels, heart rate and rhythm, breathing rate and rhythm, the flow of air through your mouth and nose, snoring, body muscle movements, and chest and belly movement.  ZIO XT- Long Term Monitor Instructions  Your physician has requested you wear a ZIO patch monitor for 14 days.  This is a single patch monitor. [employer] supplies one patch monitor per enrollment. Additional stickers are not available. Please do not apply patch if you will be having a Nuclear Stress Test,  Echocardiogram, Cardiac CT, MRI, or Chest Xray during the period you would be wearing the  monitor. The patch cannot be worn during these tests. You cannot remove and re-apply the  ZIO XT patch monitor.  Your ZIO patch monitor will be mailed 3 day USPS to your address on file. It may take 3-5 days  to receive your monitor after you have been enrolled.  Once you have received your monitor, please review the enclosed instructions. Your monitor  has already been registered assigning a specific monitor serial # to you.  Billing and Patient Assistance Program Information  We have supplied [employer] with any of your insurance information on file for billing  purposes. [employer] offers a sliding scale Patient Assistance Program for patients that do not have  insurance, or whose insurance does not completely cover the cost of the ZIO monitor.  You must apply for the Patient Assistance Program to qualify for this discounted rate.  To apply, please call [employer] at [phone], select option 4, select option 2, ask to apply for  Patient Assistance Program. Meredeth Ide will ask your household income, and how many people  are in your household. They will quote your out-of-pocket cost based on that information.  [employer] will also be able to set up a 20-month, interest-free payment plan if needed.  Applying the monitor   Shave hair from upper left chest.  Hold abrader disc by orange tab. Rub abrader in 40 strokes over the upper left chest as  indicated in your monitor instructions.  Clean area with 4 enclosed alcohol pads. Let dry.  Apply patch as indicated in monitor instructions. Patch will be placed under collarbone on left  side of chest with arrow pointing upward.  Rub patch adhesive wings for 2 minutes. Remove white label marked "1". Remove the white  label marked "2". Rub patch adhesive wings for 2 additional minutes.  While looking in a mirror, press and release button in center of patch. A small green light will  flash 3-4 times. This will be your only indicator that the monitor has been turned on.  Do not shower for the first 24 hours. You may shower after the first 24 hours.  Press the button if you feel a symptom.  You will hear a small click. Record Date, Time and  Symptom in the Patient Logbook.  When you are ready to remove the patch, follow instructions on the last 2 pages of Patient  Logbook. Stick patch monitor onto the last page of Patient Logbook.  Place Patient Logbook in the blue and white box. Use locking tab on box and tape box closed  securely. The blue and white box has prepaid postage on it. Please place it in the mailbox as  soon  as possible. Your physician should have your test results approximately 7 days after the  monitor has been mailed back to [facility].  Call [facility] Customer Care at [phone] if you have questions regarding  your ZIO XT patch monitor. Call them immediately if you see an orange light blinking on your  monitor.  If your monitor falls off in less than 4 days, contact our Monitor department at [phone].  If your monitor becomes loose or falls off after 4 days call [employer] at [phone] for  suggestions on securing your monitor    Follow-Up: At [facility], you and your health needs are our priority.  As part of our continuing mission to provide you with exceptional heart care, we have created designated Provider Care Teams.  These Care Teams include your primary Cardiologist (physician) and Advanced Practice Providers (APPs -  Physician Assistants and Nurse Practitioners) who all work together to provide you with the care you need, when you need it.  We recommend signing up for the patient portal called "MyChart".  Sign up information is provided on this After Visit Summary.  MyChart is used to connect with patients for Virtual Visits (Telemedicine).  Patients are able to view lab/test results, encounter notes, upcoming appointments, etc.  Non-urgent messages can be sent to your provider as well.   To learn more about what you can do with MyChart, go to [email or web address].    Your next appointment:   2 week(s)  The format for your next appointment:   In Person  Provider:   Thomasene Ripple, DO     Other Instructions

## 2021-06-13 NOTE — Progress Notes (Unsigned)
Enrolled for [employer] to mail a ZIO XT long term holter monitor to the patients address on file.  
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
## 2021-06-18 ENCOUNTER — Other Ambulatory Visit (HOSPITAL_BASED_OUTPATIENT_CLINIC_OR_DEPARTMENT_OTHER): Payer: Self-pay

## 2021-06-18 DIAGNOSIS — R002 Palpitations: Secondary | ICD-10-CM | POA: Diagnosis not present

## 2021-06-25 ENCOUNTER — Telehealth: Payer: Self-pay | Admitting: *Deleted

## 2021-06-25 NOTE — Telephone Encounter (Signed)
Patient notified of sleep study appointment details. 

## 2021-07-05 ENCOUNTER — Encounter: Payer: Self-pay | Admitting: Cardiology

## 2021-07-05 ENCOUNTER — Ambulatory Visit (INDEPENDENT_AMBULATORY_CARE_PROVIDER_SITE_OTHER): Payer: 59 | Admitting: Cardiology

## 2021-07-05 ENCOUNTER — Other Ambulatory Visit: Payer: Self-pay

## 2021-07-05 VITALS — BP 140/88 | HR 85 | Ht 68.0 in | Wt 296.6 lb

## 2021-07-05 DIAGNOSIS — E782 Mixed hyperlipidemia: Secondary | ICD-10-CM | POA: Diagnosis not present

## 2021-07-05 DIAGNOSIS — I10 Essential (primary) hypertension: Secondary | ICD-10-CM | POA: Diagnosis not present

## 2021-07-05 MED ORDER — CARVEDILOL 6.25 MG PO TABS: 6.2500 mg | ORAL_TABLET | Freq: Two times a day (BID) | ORAL | 3 refills | Status: DC

## 2021-07-05 MED ORDER — LOSARTAN POTASSIUM 100 MG PO TABS: 100.0000 mg | ORAL_TABLET | Freq: Every day | ORAL | 3 refills | Status: DC

## 2021-07-05 MED ORDER — ATORVASTATIN CALCIUM 20 MG PO TABS: 20.0000 mg | ORAL_TABLET | Freq: Every day | ORAL | 3 refills | Status: DC

## 2021-07-05 MED ORDER — AMLODIPINE BESYLATE 10 MG PO TABS: 10.0000 mg | ORAL_TABLET | Freq: Every day | ORAL | 3 refills | Status: DC

## 2021-07-05 NOTE — Progress Notes (Signed)
?Cardiology Office Note:   ? ?Date:  07/05/2021  ? ?ID:  Alejandro Santos, DOB 1981/02/05, MRN [id number] ? ?PCP:  Chrismon, Jodell Cipro, PA-C (Inactive)  ?Cardiologist:  Thomasene Ripple, DO  ?Electrophysiologist:  None  ? ?Referring MD: No ref. provider found  ? ?" I am doing ok" ? ?History of Present Illness:   ? ?Alejandro Santos is a 41 y.o. male with a hx of hypertension, morbid obesity here today for follow-up visit. ? ?Saw the patient June 13, 2021 posthospitalization.  During that visit he was hypertensive, on losartan and amlodipine, added carvedilol 6.25 mg twice daily to his regimen. ? ?During that time I also placed a monitor on the patient and set him up for sleep study. ? ? ?He is here today for follow-up visit. ? ?He tells me that he feels lightheaded at times.  He does feel slightly feel like he is not focusing as well.  No other complaints. ? ?Past Medical History:  ?Diagnosis Date  ? History of surgery on upper extremity   ? Multiple surgeries on right upper arm after an industrial accident  ? HTN (hypertension)   ? ? ?Past Surgical History:  ?Procedure Laterality Date  ? HAND SURGERY Right   ? right arm  x 6  ? KNEE SURGERY Left   ? ? ?Current Medications: ?Current Meds  ?Medication Sig  ? amLODipine (NORVASC) 10 MG tablet Take 1 tablet (10 mg total) by mouth daily.  ? atorvastatin (LIPITOR) 20 MG tablet Take 1 tablet (20 mg total) by mouth daily.  ? carvedilol (COREG) 6.25 MG tablet Take 1 tablet (6.25 mg total) by mouth 2 (two) times daily.  ? losartan (COZAAR) 100 MG tablet Take 1 tablet (100 mg total) by mouth daily.  ? traMADol (ULTRAM) 50 MG tablet Take 50 mg by mouth every 6 (six) hours as needed.  ?  ? ?Allergies:   Patient has no known allergies.  ? ?Social History  ? ?Socioeconomic History  ? Marital status: Married  ?  Spouse name: Not on file  ? Number of children: Not on file  ? Years of education: Not on file  ? Highest education level: Not on file  ?Occupational History  ? Not on file   ?Tobacco Use  ? Smoking status: Never  ? Smokeless tobacco: Never  ?Vaping Use  ? Vaping Use: Never used  ?Substance and Sexual Activity  ? Alcohol use: No  ? Drug use: No  ? Sexual activity: Not on file  ?Other Topics Concern  ? Not on file  ?Social History Narrative  ? Not on file  ? ?Social Determinants of Health  ? ?Financial Resource Strain: Not on file  ?Food Insecurity: Not on file  ?Transportation Needs: Not on file  ?Physical Activity: Not on file  ?Stress: Not on file  ?Social Connections: Not on file  ?  ? ?Family History: ?The patient's family history includes Breast cancer in his mother; Healthy in his father; Hypertension in his father. There is no history of Kidney cancer, Bladder Cancer, or Prostate cancer. ? ?ROS:   ?Review of Systems  ?Constitution: Negative for decreased appetite, fever and weight gain.  ?HENT: Negative for congestion, ear discharge, hoarse voice and sore throat.   ?Eyes: Negative for discharge, redness, vision loss in right eye and visual halos.  ?Cardiovascular: Negative for chest pain, dyspnea on exertion, leg swelling, orthopnea and palpitations.  ?Respiratory: Negative for cough, hemoptysis, shortness of breath and snoring.   ?Endocrine: Negative for  heat intolerance and polyphagia.  ?Hematologic/Lymphatic: Negative for bleeding problem. Does not bruise/bleed easily.  ?Skin: Negative for flushing, nail changes, rash and suspicious lesions.  ?Musculoskeletal: Negative for arthritis, joint pain, muscle cramps, myalgias, neck pain and stiffness.  ?Gastrointestinal: Negative for abdominal pain, bowel incontinence, diarrhea and excessive appetite.  ?Genitourinary: Negative for decreased libido, genital sores and incomplete emptying.  ?Neurological: Negative for brief paralysis, focal weakness, headaches and loss of balance.  ?Psychiatric/Behavioral: Negative for altered mental status, depression and suicidal ideas.  ?Allergic/Immunologic: Negative for HIV exposure and persistent  infections.  ? ? ?EKGs/Labs/Other Studies Reviewed:   ? ?The following studies were reviewed today: ? ? ?EKG:  none today ? ?TTE 06/06/2021 IMPRESSIONS  ? 1. Left ventricular ejection fraction, by estimation, is 55 to 60%. The  ?left ventricle has normal function. The left ventricle has no regional  ?wall motion abnormalities. There is severe concentric left ventricular  ?hypertrophy. Left ventricular diastolic  ? parameters are consistent with Grade I diastolic dysfunction (impaired  ?relaxation). The average left ventricular global longitudinal strain is  ?-16.9 %. The global longitudinal strain is abnormal.  ? 2. Right ventricular systolic function is normal. The right ventricular  ?size is normal. Tricuspid regurgitation signal is inadequate for assessing  ?PA pressure.  ? 3. The mitral valve is normal in structure. No evidence of mitral valve  ?regurgitation. No evidence of mitral stenosis.  ? 4. The aortic valve is tricuspid. Aortic valve regurgitation is not  ?visualized. No aortic stenosis is present.  ? 5. Aortic dilatation noted. 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  ? 6. The inferior vena cava is normal in size with greater than 50%  ?respiratory variability, suggesting right atrial pressure of 3 mmHg.  ? 7. Negative bubble study, no evidence for PFO or ASD.  ? ?FINDINGS  ? Left Ventricle: Left ventricular ejection fraction, by estimation, is 55  ?to 60%. The left ventricle has normal function. The left ventricle has no  ?regional wall motion abnormalities. The average left ventricular global  ?longitudinal strain is -16.9 %.  ?The global longitudinal strain is abnormal. The left ventricular internal  ?cavity size was normal in size. There is severe concentric left  ?ventricular hypertrophy. Left ventricular diastolic parameters are  ?consistent with Grade I diastolic dysfunction  ?(impaired relaxation).  ? ?Right Ventricle: The right ventricular size is normal. No increase  in  ?right ventricular wall thickness. Right ventricular systolic function is  ?normal. Tricuspid regurgitation signal is inadequate for assessing PA  ?pressure.  ? ?Left Atrium: Left atrial size was normal in size.  ? ?Right Atrium: Right atrial size was normal in size.  ? ?Pericardium: There is no evidence of pericardial effusion.  ? ?Mitral Valve: The mitral valve is normal in structure. No evidence of  ?mitral valve regurgitation. No evidence of mitral valve stenosis.  ? ?Tricuspid Valve: The tricuspid valve is normal in structure. Tricuspid  ?valve regurgitation is not demonstrated.  ? ?Aortic Valve: The aortic valve is tricuspid. Aortic valve regurgitation is  ?not visualized. No aortic stenosis is present.  ? ?Pulmonic Valve: The pulmonic valve was normal in structure. Pulmonic valve  ?regurgitation is not visualized.  ? ?Aorta: The aortic root is normal in size and structure and aortic dilatation noted. There is mild dilatation of the ascending aorta,  ?measuring 37 mm.  ? ?Venous: The inferior vena cava is normal in size with greater than 50% respiratory variability, suggesting right atrial pressure of  3 mmHg.  ? ?IAS/Shunts: Negative bubble study, no evidence for PFO or ASD. Agitated saline contrast was given intravenously to evaluate for intracardiac  ?shunting.  ?  ?Other imaging study ?2/14>> CTA head/neck: Patent vasculature of the head and neck ?2/14>> CTA chest/abdomen/pelvis: No dissection-no acute findings in the chest/abdomen/pelvis. ?2/15>> MRI brain: No acute CVA ?2/15>> MRI C-spine: Mild degenerative changes ?2/15>> Echo: EF 55-60%, no regional wall motion abnormalities, grade 1 diastolic dysfunction ? ? ?Recent Labs: ?06/06/2021: ALT 27; Hemoglobin 14.5; Platelets 406 ?06/07/2021: Magnesium 2.1; TSH 1.655 ?06/08/2021: BUN 27; Creatinine, Ser 1.69; Potassium 3.3; Sodium 137  ?Recent Lipid Panel ?   ?Component Value Date/Time  ? CHOL 223 (H) 06/06/2021 0403  ? CHOL 186 08/02/2019 0908  ? TRIG 134  06/06/2021 0403  ? HDL 45 06/06/2021 0403  ? HDL 41 08/02/2019 0908  ? CHOLHDL 5.0 06/06/2021 0403  ? VLDL 27 06/06/2021 0403  ? LDLCALC 151 (H) 06/06/2021 0403  ? LDLCALC 117 (H) 08/02/2019 0908  ? ? ?Phys

## 2021-07-05 NOTE — Patient Instructions (Signed)
Medication Instructions:  ?Your physician recommends that you continue on your current medications as directed. Please refer to the Current Medication list given to you today.  ?Hold your Lipitor for 2 weeks, send a MyChart message to update [place] about your symptoms.  ?*If you need a refill on your cardiac medications before your next appointment, please call your pharmacy* ? ? ?Lab Work: ?None ?If you have labs (blood work) drawn today and your tests are completely normal, you will receive your results only by: ?MyChart Message (if you have MyChart) OR ?A paper copy in the mail ?If you have any lab test that is abnormal or we need to change your treatment, we will call you to review the results. ? ? ?Testing/Procedures: ?None ? ? ?Follow-Up: ?At [facility], you and your health needs are our priority.  As part of our continuing mission to provide you with exceptional heart care, we have created designated Provider Care Teams.  These Care Teams include your primary Cardiologist (physician) and Advanced Practice Providers (APPs -  Physician Assistants and Nurse Practitioners) who all work together to provide you with the care you need, when you need it. ? ?We recommend signing up for the patient portal called "MyChart".  Sign up information is provided on this After Visit Summary.  MyChart is used to connect with patients for Virtual Visits (Telemedicine).  Patients are able to view lab/test results, encounter notes, upcoming appointments, etc.  Non-urgent messages can be sent to your provider as well.   ?To learn more about what you can do with MyChart, go to [email or web address].   ? ?Your next appointment:   ?12 week(s) ? ?The format for your next appointment:   ?In Person ? ?Provider:   ?Thomasene Ripple, DO   ? ? ?Other Instructions ?  ?

## 2021-07-10 ENCOUNTER — Telehealth: Payer: Self-pay | Admitting: Cardiology

## 2021-07-10 NOTE — Telephone Encounter (Signed)
Called Triage, no answer ?

## 2021-07-10 NOTE — Telephone Encounter (Signed)
Tried calling the provided number, no answer at this time. Received a bust tone. Called twice.  ?

## 2021-07-10 NOTE — Telephone Encounter (Signed)
[facility] is calling to give critical result ?

## 2021-07-10 NOTE — Telephone Encounter (Signed)
After consulting with Dr. Flora Lipps, DOD. Called pt to see how he was feeling. He states he does get dizzy and his heart races but it depends on what he is doing. He states if he drives over 2 hours he has these symptoms as well. Pt's sleep study is scheduled for April 5th. He will go through with his study since sleep apnea is suspected. Dr. Flora Lipps suggested having the pt evaluated by EP. Will get message over to Dr. Servando Salina for review. Pt verbalized understanding,.  ?

## 2021-07-10 NOTE — Telephone Encounter (Signed)
Spoke with Marcille Blanco. She reports 7.8 sec pause due to possible high AV block. Page 37 strip 9.  ?Back to back 3.9, 5.8 pause due to possible AV block page 35 strip 6.  ?Longest: 7.8 secs.  ?Will print report and show to DOD.  ?

## 2021-07-11 ENCOUNTER — Other Ambulatory Visit: Payer: Self-pay

## 2021-07-11 ENCOUNTER — Ambulatory Visit (INDEPENDENT_AMBULATORY_CARE_PROVIDER_SITE_OTHER): Payer: Commercial Managed Care - PPO

## 2021-07-11 ENCOUNTER — Telehealth: Payer: Self-pay

## 2021-07-11 DIAGNOSIS — I4430 Unspecified atrioventricular block: Secondary | ICD-10-CM

## 2021-07-11 DIAGNOSIS — I459 Conduction disorder, unspecified: Secondary | ICD-10-CM

## 2021-07-11 MED ORDER — HYDRALAZINE HCL 25 MG PO TABS: 25.0000 mg | ORAL_TABLET | Freq: Two times a day (BID) | ORAL | 3 refills | Status: DC

## 2021-07-11 NOTE — Progress Notes (Unsigned)
Enrolled patient for a 14 day Zio XT  monitor to be mailed to patients home  °

## 2021-07-11 NOTE — Telephone Encounter (Signed)
Called pt to update him about Dr. Terrial Rhodes recommendations after seeing his Zio report.  ?His is to stop Coreg. Start Hydralazine 25 mg twice daily. He will see Dr. Curt Bears, as well as repeat the [facility] monitor 2 weeks after stopping Coreg. He verbalized understanding, MyChart message will be sent with instructions since pt is currently at work.  NO further questions or concerns at this time.  ?

## 2021-07-25 ENCOUNTER — Ambulatory Visit (HOSPITAL_BASED_OUTPATIENT_CLINIC_OR_DEPARTMENT_OTHER): Payer: Commercial Managed Care - PPO | Attending: Cardiology | Admitting: Cardiovascular Disease

## 2021-07-25 DIAGNOSIS — G4733 Obstructive sleep apnea (adult) (pediatric): Secondary | ICD-10-CM | POA: Insufficient documentation

## 2021-07-25 DIAGNOSIS — I10 Essential (primary) hypertension: Secondary | ICD-10-CM | POA: Diagnosis not present

## 2021-07-25 DIAGNOSIS — R0683 Snoring: Secondary | ICD-10-CM

## 2021-07-25 DIAGNOSIS — R400 Somnolence: Secondary | ICD-10-CM

## 2021-07-26 DIAGNOSIS — I459 Conduction disorder, unspecified: Secondary | ICD-10-CM

## 2021-07-26 DIAGNOSIS — I4430 Unspecified atrioventricular block: Secondary | ICD-10-CM | POA: Diagnosis not present

## 2021-08-02 ENCOUNTER — Telehealth: Payer: Self-pay | Admitting: *Deleted

## 2021-08-02 ENCOUNTER — Encounter (HOSPITAL_BASED_OUTPATIENT_CLINIC_OR_DEPARTMENT_OTHER): Payer: Self-pay | Admitting: Cardiovascular Disease

## 2021-08-02 NOTE — Telephone Encounter (Signed)
-----   Message from Troy Sine, MD sent at 08/02/2021  1:57 PM EDT ----- ?Mariann Laster, please notify pt and set up with DME for CPAP initiation ?

## 2021-08-02 NOTE — Telephone Encounter (Signed)
Received email 07/26/2021  ? ?Hello, ?Account: [facility]- [address] ?Patient InitialsRoque LiasSN: [id number] ? ?We are reaching out to notify you that the patient's device listed above fell off with less than 24 hours of wear. ?A replacement device will be shipped to the patient. ?Please let us know if you have any questions. ? ?Rollene Fare, ?[employer] Customer Care ?[phone] ?

## 2021-08-02 NOTE — Procedures (Signed)
? ? ? ? ?Patient Name: Alejandro Santos, Alejandro Santos ?Study Date: 07/25/2021 ?Gender: Male ?D.O.B: Jul 06, 1980 ?Age (years): 78 ?Referring Provider: Berniece Salines DO ?Height (inches): 68 ?Interpreting Physician: Shelva Majestic MD, ABSM ?Weight (lbs): 290 ?RPSGT: Laren Everts ?BMI: 44 ?MRN: [id number] ?Neck Size: 18.50 ? ?CLINICAL INFORMATION ?Sleep Study Type: Split Night CPAP ? ?Indication for sleep study: Fatigue, Hypertension, Morning Headaches, Obesity, OSA, Snoring.  Prior outside evaluation 01/24/2017: AHI 111.2/h  ? ?Epworth Sleepiness Score: 3 ? ?SLEEP STUDY TECHNIQUE ?As per the AASM Manual for the Scoring of Sleep and Associated Events v2.3 (April 2016) with a hypopnea requiring 4% desaturations. ? ?The channels recorded and monitored were frontal, central and occipital EEG, electrooculogram (EOG), submentalis EMG (chin), nasal and oral airflow, thoracic and abdominal wall motion, anterior tibialis EMG, snore microphone, electrocardiogram, and pulse oximetry. Continuous positive airway pressure (CPAP) was initiated when the patient met split night criteria and was titrated according to treat sleep-disordered breathing. ? ?MEDICATIONS ?amLODipine (NORVASC) 10 MG tablet ?atorvastatin (LIPITOR) 20 MG tablet ?carvedilol (COREG) 6.25 MG tablet ?hydrALAZINE (APRESOLINE) 25 MG tablet ?losartan (COZAAR) 100 MG tablet ?traMADol (ULTRAM) 50 MG tablet  ?Medications self-administered by patient taken the night of the study : HYDRALAZINE ? ?RESPIRATORY PARAMETERS ?Diagnostic ?Total AHI (/hr): 19.1 RDI (/hr): 44.1 OA Index (/hr): - CA Index (/hr): 0.4 ?REM AHI (/hr): 50.5 NREM AHI (/hr): 16.9 Supine AHI (/hr): 61.3 Non-supine AHI (/hr): 11.3 ?Min O2 Sat (%): 89.0 Mean O2 (%): 95.4 Time below 88% (min): 0  ? ?Titration ?Optimal Pressure (cm): 16 AHI at Optimal Pressure (/hr): 0 Min O2 at Optimal Pressure (%): 97.0 ?Supine % at Optimal (%): 100 Sleep % at Optimal (%): 56  ? ?SLEEP ARCHITECTURE ?The recording time for the entire  night was 421.2 minutes. ? ?During a baseline period of 182.1 minutes, the patient slept for 151.2 minutes in REM and nonREM, yielding a sleep efficiency of 83.0%%. Sleep onset after lights out was 8.0 minutes with a REM latency of 72.0 minutes. The patient spent 13.7%% of the night in stage N1 sleep, 80.0%% in stage N2 sleep, 0.0%% in stage N3 and 6.3% in REM. ? ?During the titration period of 231.6 minutes, the patient slept for 97.5 minutes in REM and nonREM, yielding a sleep efficiency of 42.1%%. Sleep onset after CPAP initiation was 124.8 minutes with a REM latency of 39.5 minutes. The patient spent 13.8%% of the night in stage N1 sleep, 25.1%% in stage N2 sleep, 0.0%% in stage N3 and 61% in REM. ? ?CARDIAC DATA ?The 2 lead EKG demonstrated sinus rhythm. The mean heart rate was 100.0 beats per minute. Other EKG findings include: None. ? ?LEG MOVEMENT DATA ?The total Periodic Limb Movements of Sleep (PLMS) were 0. The PLMS index was 0.0 . ? ?IMPRESSIONS ?- Moderate obstructive sleep apnea occurred during the diagnostic portion of the study (AHI 19.1/h; with RDI increased at 44.1/h); however, sleep apnea was severe during REM sleep (AHI 50.5/h) and with supine sleep (AHI 61.3/h).  CPAP was initiated at 5 cm and was titrated to optimal PAP pressure at 16 cm of water ( AHI 0, RDI 0, O2 nadir 97%). ?- No significant central sleep apnea occurred during the diagnostic portion of the study (CAI = 0.4/hour). ?- The patient had minimal or no oxygen desaturation during the diagnostic portion of the study (Min O2 = 89.0%) ?- The patient snored with moderate snoring volume during the diagnostic portion of the study. ?- No cardiac abnormalities were noted during this study. ?-  Clinically significant periodic limb movements did not occur during sleep. ? ?DIAGNOSIS ?- Obstructive Sleep Apnea (G47.33) ? ?RECOMMENDATIONS ?- Recommend an initial trial of CPAP Auto therapy with EPR of 3 at 15 - 20 cm of water with heated  humidification.  An Extra Small size Fisher&Paykel Full Face Evora Full mask was used for the titration. ?- Effort should be made to opimize nasal and oropharyngeal patency. ?- Avoid alcohol, sedatives and other CNS depressants that may worsen sleep apnea and disrupt normal sleep architecture. ?- Sleep hygiene should be reviewed to assess factors that may improve sleep quality. ?- Weight management and regular exercise should be initiated or continued. ?- Recommend a download and sleep clinic evaluation after 4 weeks of therapy. ? ?[Electronically signed] 08/02/2021 01:52 PM ? ?Shelva Majestic MD, [facility], ABSM ?Diplomate, Tax adviser of Sleep Medicine ? ?NPI: [id number] ? ?[place] ?PH: [phone]   FX: [phone] ?ACCREDITED BY THE AMERICAN ACADEMY OF SLEEP MEDICINE ? ?

## 2021-08-02 NOTE — Telephone Encounter (Signed)
Patient informed of sleep study results and recommendations. He agrees to proceed with CPAP therapy. CPAP order has been sent to [facility]. ?

## 2021-08-06 ENCOUNTER — Telehealth: Payer: Self-pay | Admitting: Cardiology

## 2021-08-06 NOTE — Telephone Encounter (Signed)
Alejandro Santos with [employer] called to report that the pt wore a Zio patch from 4/6-4/7... 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  ? ?I will forward to Dr. Servando Salina triage...  ? ?Pt has Consult with Dr. Elberta Fortis 09/03/21.  ? ? ?

## 2021-08-06 NOTE — Telephone Encounter (Signed)
Alejandro Santos with [employer] calling with abnormal Zio patch results. ?

## 2021-08-08 ENCOUNTER — Encounter: Payer: Self-pay | Admitting: Cardiology

## 2021-08-18 ENCOUNTER — Telehealth: Payer: Self-pay | Admitting: Medical

## 2021-08-18 NOTE — Telephone Encounter (Signed)
? ?  Notified by [facility] that patient had episodes of pauses on 14-day Zio patch monitor.  They report back-to-back pauses for total of 9.3 seconds.  Suspect this may be the episode referenced in a telephone note from 08/06/2021.  He is undergoing work-up/treatment for OSA.  Confirmed that he is not taking his carvedilol.  He is scheduled to see Dr. Elberta Fortis 09/03/2021 and was encouraged to keep this appointment.  We reviewed ED precautions.  He was appreciative of the call. ? ?Beatriz Stallion, PA-C ?08/18/21; 2:24 PM ? ?

## 2021-09-03 ENCOUNTER — Encounter: Payer: Self-pay | Admitting: Cardiology

## 2021-09-03 ENCOUNTER — Ambulatory Visit: Payer: Commercial Managed Care - PPO | Admitting: Cardiology

## 2021-09-03 VITALS — BP 124/86 | HR 93 | Ht 68.0 in | Wt 294.4 lb

## 2021-09-03 DIAGNOSIS — I442 Atrioventricular block, complete: Secondary | ICD-10-CM | POA: Diagnosis not present

## 2021-09-03 NOTE — Progress Notes (Signed)
? ?Electrophysiology Office Note ? ? ?Date:  09/03/2021  ? ?ID:  Alejandro Santos, DOB February 11, 1981, MRN [id number] ? ?PCP:  Chrismon, Jodell Cipro, PA-C (Inactive)  ?Cardiologist:  Tobb ?Primary Electrophysiologist:  Tyishia Aune Jorja Loa, MD   ? ?Chief Complaint: heart block ?  ?History of Present Illness: ?Alejandro Santos is a 41 y.o. male who is being seen today for the evaluation of heart block at the request of Tobb, Kardie, DO. Presenting today for electrophysiology evaluation. ? ?He has a history significant for hypertension, morbid obesity.  He was hospitalized 06/13/2021.  He presented with nausea and shortness of breath as well as a 7 out of 10 squeezing in his chest.  He had tingling in his fingers.  He was initially in sinus tachycardia with heart rates of 160.  Troponins were negative.  He was found to be hypertensive with heart rates of 191/126.  He had a head CT and MRI without abnormality.  He was noted to have pauses on telemetry.  Of note he has been out of his blood pressure medications for several weeks.   ? ?He is noted to be hypertensive at that time and was started on losartan, amlodipine, carvedilol.  He is unfortunately been lightheaded at times.  He has not been able to focus very well. ? ?Today, he denies symptoms of chest pain, shortness of breath, orthopnea, PND, lower extremity edema, claudication, dizziness, presyncope, syncope, bleeding, or neurologic sequela. The patient is tolerating medications without difficulties.  He is continue to have episodes where he feels tired and fatigued intermittently.  These are short-lived episodes.  His cardiac monitor has continued to show pauses, though he works various shifts.  Some of the pauses do appear to be occurring when he is sleeping.  There are some that occurs while he is awake. ? ? ?Past Medical History:  ?Diagnosis Date  ? History of surgery on upper extremity   ? Multiple surgeries on right upper arm after an industrial accident  ? HTN  (hypertension)   ? ?Past Surgical History:  ?Procedure Laterality Date  ? HAND SURGERY Right   ? right arm  x 6  ? KNEE SURGERY Left   ? ? ? ?Current Outpatient Medications  ?Medication Sig Dispense Refill  ? amLODipine (NORVASC) 10 MG tablet Take 1 tablet (10 mg total) by mouth daily. 90 tablet 3  ? atorvastatin (LIPITOR) 20 MG tablet Take 1 tablet (20 mg total) by mouth daily. 90 tablet 3  ? hydrALAZINE (APRESOLINE) 25 MG tablet Take 1 tablet (25 mg total) by mouth in the morning and at bedtime. 180 tablet 3  ? losartan (COZAAR) 100 MG tablet Take 1 tablet (100 mg total) by mouth daily. 90 tablet 3  ? traMADol (ULTRAM) 50 MG tablet Take 50 mg by mouth every 6 (six) hours as needed.    ? ?No current facility-administered medications for this visit.  ? ? ?Allergies:   Patient has no known allergies.  ? ?Social History:  The patient  reports that he has never smoked. He has never used smokeless tobacco. He reports that he does not drink alcohol and does not use drugs.  ? ?Family History:  The patient's family history includes Breast cancer in his mother; Healthy in his father; Hypertension in his father.  ? ? ?ROS:  Please see the history of present illness.   Otherwise, review of systems is positive for none.   All other systems are reviewed and negative.  ? ? ?PHYSICAL EXAM: ?VS:  BP 124/86   Pulse 93   Ht 5\' 8"  (1.727 m)   Wt 294 lb 6.4 oz (133.5 kg)   SpO2 99%   BMI 44.76 kg/m?  , BMI Body mass index is 44.76 kg/m?. ?GEN: Well nourished, well developed, in no acute distress  ?HEENT: normal  ?Neck: no JVD, carotid bruits, or masses ?Cardiac: RRR; no murmurs, rubs, or gallops,no edema  ?Respiratory:  clear to auscultation bilaterally, normal work of breathing ?GI: soft, nontender, nondistended, + BS ?MS: no deformity or atrophy  ?Skin: warm and dry ?Neuro:  Strength and sensation are intact ?Psych: euthymic mood, full affect ? ?EKG:  EKG is not ordered today. ?Personal review of the ekg ordered 06/06/21 shows  sinus rhythm, rate 90 ? ?Recent Labs: ?06/06/2021: ALT 27; Hemoglobin 14.5; Platelets 406 ?06/07/2021: Magnesium 2.1; TSH 1.655 ?06/08/2021: BUN 27; Creatinine, Ser 1.69; Potassium 3.3; Sodium 137  ? ? ?Lipid Panel  ?   ?Component Value Date/Time  ? CHOL 223 (H) 06/06/2021 0403  ? CHOL 186 08/02/2019 0908  ? TRIG 134 06/06/2021 0403  ? HDL 45 06/06/2021 0403  ? HDL 41 08/02/2019 0908  ? CHOLHDL 5.0 06/06/2021 0403  ? VLDL 27 06/06/2021 0403  ? LDLCALC 151 (H) 06/06/2021 0403  ? LDLCALC 117 (H) 08/02/2019 0908  ? ? ? ?Wt Readings from Last 3 Encounters:  ?09/03/21 294 lb 6.4 oz (133.5 kg)  ?07/25/21 290 lb (131.5 kg)  ?07/05/21 296 lb 9.6 oz (134.5 kg)  ?  ? ? ?Other studies Reviewed: ?Additional studies/ records that were reviewed today include: TTE 06/06/21  ?Review of the above records today demonstrates:  ? 1. Left ventricular ejection fraction, by estimation, is 55 to 60%. The  ?left ventricle has normal function. The left ventricle has no regional  ?wall motion abnormalities. There is severe concentric left ventricular  ?hypertrophy. Left ventricular diastolic  ? parameters are consistent with Grade Alejandro diastolic dysfunction (impaired  ?relaxation). The average left ventricular global longitudinal strain is  ?-16.9 %. The global longitudinal strain is abnormal.  ? 2. Right ventricular systolic function is normal. The right ventricular  ?size is normal. Tricuspid regurgitation signal is inadequate for assessing  ?PA pressure.  ? 3. The mitral valve is normal in structure. No evidence of mitral valve  ?regurgitation. No evidence of mitral stenosis.  ? 4. The aortic valve is tricuspid. Aortic valve regurgitation is not  ?visualized. No aortic stenosis is present.  ? 5. Aortic dilatation noted. 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  ? 6. The inferior vena cava is normal in size with greater than 50%  ?respiratory variability, suggesting right atrial pressure of 3 mmHg.  ? 7. Negative bubble  study, no evidence for PFO or ASD.  ? ?Cardiac monitor 08/22/2021 personally reviewed ?Sinus pauses with high-grade AV block.  ? ?ASSESSMENT AND PLAN: ? ?1.  Intermittent complete heart block:    Bradycardia while in the hospital was all nocturnal.  Recent cardiac monitor showed sinus pauses with AV block.  He has not yet stopped his carvedilol.  Due to his young age, would prefer to avoid pacemaker implant.  Due to that, we Alejandro Santos stop his carvedilol.  Alejandro Santos.  If he does not have further symptoms, we Alejandro Santos continue to monitor.  If symptoms do return, he would likely need to wear a another monitor.  Of note, he does work around 10/22/2021 and thus pacemaker implant  would complicate his job. ? ?2.  Obstructive sleep apnea: CPAP compliance encouraged ? ?3.  Hypertension: Currently well controlled ? ?4.  Morbid obesity: Body mass index is 44.76 kg/m?. ?Diet and exercise encouraged ? ?Case discussed with sleep medicine physician and primary cardiology ? ?Current medicines are reviewed at length with the patient today.   ?The patient does not have concerns regarding his medicines.  The following changes were made today:  none ? ?Labs/ tests ordered today include:  ?No orders of the defined types were placed in this encounter. ? ? ? ?Disposition:   FU with Alejandro Santos 3 Santos ? ?Signed, ?Alejandro Santos Jorja LoaMartin Demiah Gullickson, MD  ?09/03/2021 8:55 AM    ? ?[facility] ?[address] ?[address] ?[place] [address] ?[phone] (office) ?[phone] (fax) ? ?

## 2021-09-03 NOTE — Patient Instructions (Signed)
Medication Instructions:  ?Your physician has recommended you make the following change in your medication:  ?STOP Carvedilol (Coreg) ? ?*If you need a refill on your cardiac medications before your next appointment, please call your pharmacy* ? ? ?Lab Work: ?None ordered ? ? ?Testing/Procedures: ?None ordered ? ? ?Follow-Up: ?At [facility], you and your health needs are our priority.  As part of our continuing mission to provide you with exceptional heart care, we have created designated Provider Care Teams.  These Care Teams include your primary Cardiologist (physician) and Advanced Practice Providers (APPs -  Physician Assistants and Nurse Practitioners) who all work together to provide you with the care you need, when you need it. ? ?We recommend signing up for the patient portal called "MyChart".  Sign up information is provided on this After Visit Summary.  MyChart is used to connect with patients for Virtual Visits (Telemedicine).  Patients are able to view lab/test results, encounter notes, upcoming appointments, etc.  Non-urgent messages can be sent to your provider as well.   ?To learn more about what you can do with MyChart, go to [email or web address].   ? ?Your next appointment:   ?3 month(s) ? ?The format for your next appointment:   ?In Person ? ?Provider:   ?Loman Brooklyn, MD  ? ? ?Thank you for choosing [facility]!! ? ? ?Dory Horn, RN ?[phone] ? ?Other Instructions ? ? ? ?Important Information About Sugar ? ? ? ? ? ? ? ? ?  ?

## 2021-09-20 ENCOUNTER — Encounter: Payer: Self-pay | Admitting: Cardiology

## 2021-09-24 ENCOUNTER — Encounter: Payer: Self-pay | Admitting: Cardiology

## 2021-09-24 ENCOUNTER — Ambulatory Visit: Payer: Commercial Managed Care - PPO | Admitting: Cardiology

## 2021-09-24 VITALS — BP 128/90 | HR 101 | Ht 68.0 in | Wt 287.4 lb

## 2021-09-24 DIAGNOSIS — E782 Mixed hyperlipidemia: Secondary | ICD-10-CM

## 2021-09-24 DIAGNOSIS — I4430 Unspecified atrioventricular block: Secondary | ICD-10-CM | POA: Diagnosis not present

## 2021-09-24 DIAGNOSIS — G4733 Obstructive sleep apnea (adult) (pediatric): Secondary | ICD-10-CM

## 2021-09-24 DIAGNOSIS — I10 Essential (primary) hypertension: Secondary | ICD-10-CM | POA: Diagnosis not present

## 2021-09-24 MED ORDER — VALSARTAN 160 MG PO TABS: 160.0000 mg | ORAL_TABLET | Freq: Every day | ORAL | 3 refills | Status: DC

## 2021-09-24 MED ORDER — AMLODIPINE BESYLATE 10 MG PO TABS: 10.0000 mg | ORAL_TABLET | Freq: Every day | ORAL | 3 refills | Status: DC

## 2021-09-24 MED ORDER — HYDRALAZINE HCL 25 MG PO TABS: 25.0000 mg | ORAL_TABLET | Freq: Two times a day (BID) | ORAL | 3 refills | Status: DC

## 2021-09-24 MED ORDER — ATORVASTATIN CALCIUM 20 MG PO TABS: 20.0000 mg | ORAL_TABLET | Freq: Every day | ORAL | 3 refills | Status: DC

## 2021-09-24 NOTE — Patient Instructions (Signed)
Medication Instructions:  Your physician has recommended you make the following change in your medication:  STOP: Losartan  START: Valsartan 160 mg once daily *If you need a refill on your cardiac medications before your next appointment, please call your pharmacy*   Lab Work: None If you have labs (blood work) drawn today and your tests are completely normal, you will receive your results only by: MyChart Message (if you have MyChart) OR A paper copy in the mail If you have any lab test that is abnormal or we need to change your treatment, we will call you to review the results.   Testing/Procedures: None   Follow-Up: At [facility], you and your health needs are our priority.  As part of our continuing mission to provide you with exceptional heart care, we have created designated Provider Care Teams.  These Care Teams include your primary Cardiologist (physician) and Advanced Practice Providers (APPs -  Physician Assistants and Nurse Practitioners) who all work together to provide you with the care you need, when you need it.  We recommend signing up for the patient portal called "MyChart".  Sign up information is provided on this After Visit Summary.  MyChart is used to connect with patients for Virtual Visits (Telemedicine).  Patients are able to view lab/test results, encounter notes, upcoming appointments, etc.  Non-urgent messages can be sent to your provider as well.   To learn more about what you can do with MyChart, go to [email or web address].    Your next appointment:   5 month(s)  The format for your next appointment:   In Person  Provider:   Thomasene Ripple, DO     Other Instructions   Important Information About Sugar

## 2021-09-24 NOTE — Progress Notes (Signed)
Cardiology Office Note:    Date:  09/24/2021   ID:  Alejandro Santos, DOB 1980-12-22, MRN [id number]  PCP:  Margo Common, PA-C (Inactive)  Cardiologist:  Berniece Salines, DO  Electrophysiologist:  None   Referring MD: No ref. provider found   " I am doing fine"  History of Present Illness:    Alejandro Santos is a 41 y.o. male with a hx of hypertension, morbid obesity, recently diagnosed obstructive sleep apnea still waiting for CPAP, intermittent complete heart block however had not stop his carvedilol-he did see EP and plans for monitoring.  At his last visit we did not adjust his antihypertensive medication given his symptoms of fogginess and lightheadedness.  He suspected that the fogginess was from the statin therefore we held the statin.  In the meantime his monitor did come back showing evidence of intermittent complete AV block.  He was asked to stop his beta-blocker but unfortunately he had not stopped taking the carvedilol.  Therefore plan is to monitor the patient now off beta-blocker with repeat monitor.  He is here today for follow-up visit.  He shares his home reading with me.  His blood pressure has improved significantly but not steady under his target of less than 130/80 mmHg.  He notes that he has some lightheadedness but there not as frequent.  He still pending his CPAP titration study.  He is asking about this information on how he can set this up.  Past Medical History:  Diagnosis Date   History of surgery on upper extremity    Multiple surgeries on right upper arm after an industrial accident   HTN (hypertension)     Past Surgical History:  Procedure Laterality Date   HAND SURGERY Right    right arm  x 6   KNEE SURGERY Left     Current Medications: Current Meds  Medication Sig   traMADol (ULTRAM) 50 MG tablet Take 50 mg by mouth every 6 (six) hours as needed.   valsartan (DIOVAN) 160 MG tablet Take 1 tablet (160 mg total) by mouth daily.    [DISCONTINUED] amLODipine (NORVASC) 10 MG tablet Take 1 tablet (10 mg total) by mouth daily.   [DISCONTINUED] atorvastatin (LIPITOR) 20 MG tablet Take 1 tablet (20 mg total) by mouth daily.   [DISCONTINUED] hydrALAZINE (APRESOLINE) 25 MG tablet Take 1 tablet (25 mg total) by mouth in the morning and at bedtime.   [DISCONTINUED] losartan (COZAAR) 100 MG tablet Take 1 tablet (100 mg total) by mouth daily.     Allergies:   Patient has no known allergies.   Social History   Socioeconomic History   Marital status: Married    Spouse name: Not on file   Number of children: Not on file   Years of education: Not on file   Highest education level: Not on file  Occupational History   Not on file  Tobacco Use   Smoking status: Never   Smokeless tobacco: Never  Vaping Use   Vaping Use: Never used  Substance and Sexual Activity   Alcohol use: No   Drug use: No   Sexual activity: Not on file  Other Topics Concern   Not on file  Social History Narrative   Not on file   Social Determinants of Health   Financial Resource Strain: Not on file  Food Insecurity: Not on file  Transportation Needs: Not on file  Physical Activity: Not on file  Stress: Not on file  Social Connections: Not on file  Family History: The patient's family history includes Breast cancer in his mother; Healthy in his father; Hypertension in his father. There is no history of Kidney cancer, Bladder Cancer, or Prostate cancer.  ROS:   Review of Systems  Constitution: Negative for decreased appetite, fever and weight gain.  HENT: Negative for congestion, ear discharge, hoarse voice and sore throat.   Eyes: Negative for discharge, redness, vision loss in right eye and visual halos.  Cardiovascular: Negative for chest pain, dyspnea on exertion, leg swelling, orthopnea and palpitations.  Respiratory: Negative for cough, hemoptysis, shortness of breath and snoring.   Endocrine: Negative for heat intolerance and  polyphagia.  Hematologic/Lymphatic: Negative for bleeding problem. Does not bruise/bleed easily.  Skin: Negative for flushing, nail changes, rash and suspicious lesions.  Musculoskeletal: Negative for arthritis, joint pain, muscle cramps, myalgias, neck pain and stiffness.  Gastrointestinal: Negative for abdominal pain, bowel incontinence, diarrhea and excessive appetite.  Genitourinary: Negative for decreased libido, genital sores and incomplete emptying.  Neurological: Negative for brief paralysis, focal weakness, headaches and loss of balance.  Psychiatric/Behavioral: Negative for altered mental status, depression and suicidal ideas.  Allergic/Immunologic: Negative for HIV exposure and persistent infections.    EKGs/Labs/Other Studies Reviewed:    The following studies were reviewed today:   EKG: None today  ZIO monitor Aug 20, 2021 Patch Wear Time:  15 days and 5 hours (2023-04-06T05:59:45-0400 to 2023-04-22T08:04:55-0400)   Monitor 1 Patient had a min HR of 21 bpm, max HR of 149 bpm, and avg HR of 95 bpm. Predominant underlying rhythm was Sinus Rhythm. First Degree AV Block was present. 8 Pauses occurred, the longest lasting 6 secs (10 bpm). Pauses occurred due to High Grade AV  Block. 4 episode(s) of AV Block (High Grade) occurred, lasting a total of 14 secs. Second Degree AV Block-Mobitz I (Wenckebach) was present. Isolated SVEs were rare (<1.0%), and no SVE Couplets or SVE Triplets were present. Isolated VEs were rare  (<1.0%), and no VE Couplets or VE Triplets were present. MD notification criteria for High Grade AV Block (6.0 seconds of Ventricular Asystole) met - report posted prior to notification per account request (AK).   Monitor 2 Patient had a min HR of 21 bpm, max HR of 231 bpm, and avg HR of 96 bpm. Predominant underlying rhythm was Sinus Rhythm. First Degree AV Block was present. 1 run of Ventricular Tachycardia occurred lasting 12 beats with a max rate of 231 bpm (avg  202  bpm). 56 Pauses occurred, the longest lasting 7.7 secs (8 bpm). Pause(s) occurred due to Possible High Grade AV Block. 47 episode(s) of AV Block (High Grade) occurred, lasting a total of 5 mins 13 secs. Second Degree AV Block-Mobitz I (Wenckebach) was  present. Wenckebach was detected within +/- 45 seconds of symptomatic patient event(s). Isolated SVEs were rare (<1.0%), SVE Couplets were rare (<1.0%), and no SVE Triplets were present. Isolated VEs were rare (<1.0%), and no VE Couplets or VE Triplets  were present. MD notification criteria for High Grade AV Block (6.0-7.7 seconds of Ventricular Asystole) met - report posted prior to notification per account request (ES).     Conclusion: Sinus pauses with high-grade AV block. He has been referred to electrophysiology for evaluation.    TTE 06/06/2021 IMPRESSIONS   1. Left ventricular ejection fraction, by estimation, is 55 to 60%. The  left ventricle has normal function. The left ventricle has no regional  wall motion abnormalities. There is severe concentric left ventricular  hypertrophy. Left ventricular  diastolic   parameters are consistent with Grade I diastolic dysfunction (impaired  relaxation). The average left ventricular global longitudinal strain is  -16.9 %. The global longitudinal strain is abnormal.   2. Right ventricular systolic function is normal. The right ventricular  size is normal. Tricuspid regurgitation signal is inadequate for assessing  PA pressure.   3. The mitral valve is normal in structure. No evidence of mitral valve  regurgitation. No evidence of mitral stenosis.   4. The aortic valve is tricuspid. Aortic valve regurgitation is not  visualized. No aortic stenosis is present.   5. Aortic dilatation noted. 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   6. The inferior vena cava is normal in size with greater than 50%  respiratory variability, suggesting right atrial pressure of 3 mmHg.    7. Negative bubble study, no evidence for PFO or ASD.   FINDINGS   Left Ventricle: Left ventricular ejection fraction, by estimation, is 55  to 60%. The left ventricle has normal function. The left ventricle has no  regional wall motion abnormalities. The average left ventricular global  longitudinal strain is -16.9 %.  The global longitudinal strain is abnormal. The left ventricular internal  cavity size was normal in size. There is severe concentric left  ventricular hypertrophy. Left ventricular diastolic parameters are  consistent with Grade I diastolic dysfunction  (impaired relaxation).   Right Ventricle: The right ventricular size is normal. No increase in  right ventricular wall thickness. Right ventricular systolic function is  normal. Tricuspid regurgitation signal is inadequate for assessing PA  pressure.   Left Atrium: Left atrial size was normal in size.   Right Atrium: Right atrial size was normal in size.   Pericardium: There is no evidence of pericardial effusion.   Mitral Valve: The mitral valve is normal in structure. No evidence of  mitral valve regurgitation. No evidence of mitral valve stenosis.   Tricuspid Valve: The tricuspid valve is normal in structure. Tricuspid  valve regurgitation is not demonstrated.   Aortic Valve: The aortic valve is tricuspid. Aortic valve regurgitation is  not visualized. No aortic stenosis is present.   Pulmonic Valve: The pulmonic valve was normal in structure. Pulmonic valve  regurgitation is not visualized.   Aorta: The aortic root is normal in size and structure and aortic dilatation noted. There is mild dilatation of the ascending aorta,  measuring 37 mm.   Venous: The inferior vena cava is normal in size with greater than 50% respiratory variability, suggesting right atrial pressure of 3 mmHg.   IAS/Shunts: Negative bubble study, no evidence for PFO or ASD. Agitated saline contrast was given intravenously to evaluate  for intracardiac  shunting.    Other imaging study 2/14>> CTA head/neck: Patent vasculature of the head and neck 2/14>> CTA chest/abdomen/pelvis: No dissection-no acute findings in the chest/abdomen/pelvis. 2/15>> MRI brain: No acute CVA 2/15>> MRI C-spine: Mild degenerative changes 2/15>> Echo: EF 55-60%, no regional wall motion abnormalities, grade 1 diastolic dysfunction    Recent Labs: 06/06/2021: ALT 27; Hemoglobin 14.5; Platelets 406 06/07/2021: Magnesium 2.1; TSH 1.655 06/08/2021: BUN 27; Creatinine, Ser 1.69; Potassium 3.3; Sodium 137  Recent Lipid Panel    Component Value Date/Time   CHOL 223 (H) 06/06/2021 0403   CHOL 186 08/02/2019 0908   TRIG 134 06/06/2021 0403   HDL 45 06/06/2021 0403   HDL 41 08/02/2019 0908   CHOLHDL 5.0 06/06/2021 0403   VLDL 27 06/06/2021 0403   LDLCALC  151 (H) 06/06/2021 0403   LDLCALC 117 (H) 08/02/2019 0908    Physical Exam:    VS:  BP 128/90   Pulse (!) 101   Ht '5\' 8"'  (1.727 m)   Wt 287 lb 6.4 oz (130.4 kg)   SpO2 99%   BMI 43.70 kg/m     Wt Readings from Last 3 Encounters:  09/24/21 287 lb 6.4 oz (130.4 kg)  09/03/21 294 lb 6.4 oz (133.5 kg)  07/25/21 290 lb (131.5 kg)     GEN: Well nourished, well developed in no acute distress HEENT: Normal NECK: No JVD; No carotid bruits LYMPHATICS: No lymphadenopathy CARDIAC: S1S2 noted,RRR, no murmurs, rubs, gallops RESPIRATORY:  Clear to auscultation without rales, wheezing or rhonchi  ABDOMEN: Soft, non-tender, non-distended, +bowel sounds, no guarding. EXTREMITIES: No edema, No cyanosis, no clubbing MUSCULOSKELETAL:  No deformity  SKIN: Warm and dry NEUROLOGIC:  Alert and oriented x 3, non-focal PSYCHIATRIC:  Normal affect, good insight  ASSESSMENT:    1. Essential hypertension   2. Morbid obesity ([place])   3. Mixed hyperlipidemia   4. AV heart block   5. OSA (obstructive sleep apnea)    PLAN:     Hypertension-I reviewed his home blood pressure recording he has had  significant improvement but not steady at his target.  So what I like to do is transition the patient off losartan and start valsartan 160 mg daily, continue his amlodipine 10 mg daily, continue hydralazine 25 mg twice a day.  Hopefully this will keep him less than 130/80 mmHg.  Intermittent AV block-plan for repeat monitor.  We will do that in about 90 days from now if this Slight symptoms continues.  OSA-he is still waiting to be set up for his CPAP titration study.  We will follow-up with this to hopefully get the patient scheduled.  The patient understands the need to lose weight with diet and exercise. We have discussed specific strategies for this.  The patient is in agreement with the above plan. The patient left the office in stable condition.  The patient will follow up in   Medication Adjustments/Labs and Tests Ordered: Current medicines are reviewed at length with the patient today.  Concerns regarding medicines are outlined above.  No orders of the defined types were placed in this encounter.  Meds ordered this encounter  Medications   valsartan (DIOVAN) 160 MG tablet    Sig: Take 1 tablet (160 mg total) by mouth daily.    Dispense:  90 tablet    Refill:  3   amLODipine (NORVASC) 10 MG tablet    Sig: Take 1 tablet (10 mg total) by mouth daily.    Dispense:  90 tablet    Refill:  3   atorvastatin (LIPITOR) 20 MG tablet    Sig: Take 1 tablet (20 mg total) by mouth daily.    Dispense:  90 tablet    Refill:  3   hydrALAZINE (APRESOLINE) 25 MG tablet    Sig: Take 1 tablet (25 mg total) by mouth in the morning and at bedtime.    Dispense:  180 tablet    Refill:  3    Patient Instructions  Medication Instructions:  Your physician has recommended you make the following change in your medication:  STOP: Losartan  START: Valsartan 160 mg once daily *If you need a refill on your cardiac medications before your next appointment, please call your pharmacy*   Lab  Work: None If you have labs (blood work)  drawn today and your tests are completely normal, you will receive your results only by: MyChart Message (if you have MyChart) OR A paper copy in the mail If you have any lab test that is abnormal or we need to change your treatment, we will call you to review the results.   Testing/Procedures: None   Follow-Up: At [facility], you and your health needs are our priority.  As part of our continuing mission to provide you with exceptional heart care, we have created designated Provider Care Teams.  These Care Teams include your primary Cardiologist (physician) and Advanced Practice Providers (APPs -  Physician Assistants and Nurse Practitioners) who all work together to provide you with the care you need, when you need it.  We recommend signing up for the patient portal called "MyChart".  Sign up information is provided on this After Visit Summary.  MyChart is used to connect with patients for Virtual Visits (Telemedicine).  Patients are able to view lab/test results, encounter notes, upcoming appointments, etc.  Non-urgent messages can be sent to your provider as well.   To learn more about what you can do with MyChart, go to [email or web address].    Your next appointment:   5 month(s)  The format for your next appointment:   In Person  Provider:   Berniece Salines, DO     Other Instructions   Important Information About Sugar         Adopting a Healthy Lifestyle.  Know what a healthy weight is for you (roughly BMI <25) and aim to maintain this   Aim for 7+ servings of fruits and vegetables daily   65-80+ fluid ounces of water or unsweet tea for healthy kidneys   Limit to max 1 drink of alcohol per day; avoid smoking/tobacco   Limit animal fats in diet for cholesterol and heart health - choose grass fed whenever available   Avoid highly processed foods, and foods high in saturated/trans fats   Aim for low stress - take time  to unwind and care for your mental health   Aim for 150 min of moderate intensity exercise weekly for heart health, and weights twice weekly for bone health   Aim for 7-9 hours of sleep daily   When it comes to diets, agreement about the perfect plan isnt easy to find, even among the experts. Experts at the [employer] developed an idea known as the Healthy Eating Plate. Just imagine a plate divided into logical, healthy portions.   The emphasis is on diet quality:   Load up on vegetables and fruits - one-half of your plate: Aim for color and variety, and remember that potatoes dont count.   Go for whole grains - one-quarter of your plate: Whole wheat, barley, wheat berries, quinoa, oats, brown rice, and foods made with them. If you want pasta, go with whole wheat pasta.   Protein power - one-quarter of your plate: Fish, chicken, beans, and nuts are all healthy, versatile protein sources. Limit red meat.   The diet, however, does go beyond the plate, offering a few other suggestions.   Use healthy plant oils, such as olive, canola, soy, corn, sunflower and peanut. Check the labels, and avoid partially hydrogenated oil, which have unhealthy trans fats.   If youre thirsty, drink water. Coffee and tea are good in moderation, but skip sugary drinks and limit milk and dairy products to one or two daily servings.   The type of carbohydrate in  the diet is more important than the amount. Some sources of carbohydrates, such as vegetables, fruits, whole grains, and beans-are healthier than others.   Finally, stay active  Signed, Berniece Salines, DO  09/24/2021 8:45 AM    [place]

## 2021-10-08 ENCOUNTER — Encounter: Payer: Self-pay | Admitting: Cardiology

## 2021-10-25 ENCOUNTER — Other Ambulatory Visit: Payer: Self-pay | Admitting: Cardiology

## 2021-12-03 ENCOUNTER — Encounter: Payer: Self-pay | Admitting: Cardiology

## 2021-12-03 ENCOUNTER — Ambulatory Visit: Payer: Commercial Managed Care - PPO | Admitting: Cardiology

## 2021-12-03 VITALS — BP 136/88 | HR 88 | Ht 68.0 in | Wt 283.4 lb

## 2021-12-03 DIAGNOSIS — I442 Atrioventricular block, complete: Secondary | ICD-10-CM

## 2021-12-03 NOTE — Progress Notes (Signed)
Electrophysiology Office Note   Date:  12/03/2021   ID:  Alejandro Santos, DOB [birth year], MRN [id number]  PCP:  Tamsen Roers, PA-C (Inactive)  Cardiologist:  [place] Primary Electrophysiologist:  Evelyn Moch Jorja Loa, MD    Chief Complaint: heart block   History of Present Illness: Alejandro Santos is a 41 y.o. male who is being seen today for the evaluation of heart block at the request of No ref. provider found. Presenting today for electrophysiology evaluation.  Has a history significant hypertension, morbid obesity.  He was hospitalized 06/03/2021.  He presented with nausea, shortness of breath and 7 out of 10 squeezing chest pain.  He had tingling in his fingers.  He was tachycardic with heart rates of 160.  Troponins were negative.  He was found to be hypertensive.  Head CT and MRI without abnormality.  He was noted to have pauses on telemetry.  He had been out of his blood pressure medications for several weeks.  He was started on losartan, amlodipine, carvedilol.  He had been lightheaded.  He wore a cardiac monitor that showed episodic sinus bradycardia as well as pauses.  He was still taking his carvedilol.  Today, denies symptoms of palpitations, chest pain, shortness of breath, orthopnea, PND, lower extremity edema, claudication, dizziness, presyncope, syncope, bleeding, or neurologic sequela. The patient is tolerating medications without difficulties.  Since stopping his carvedilol he is felt much improved.  He has no chest pain or shortness of breath.  His near syncopal episodes have also significantly improved.  He states that he can work a 14-hour a day, and feels tired at the end of the day, but his level of fatigue is better.  He has a watch that monitors his heart rate and has not shown any evidence of bradycardia.   Past Medical History:  Diagnosis Date   History of surgery on upper extremity    Multiple surgeries on right upper arm after an industrial accident   HTN  (hypertension)    Past Surgical History:  Procedure Laterality Date   HAND SURGERY Right    right arm  x 6   KNEE SURGERY Left      Current Outpatient Medications  Medication Sig Dispense Refill   amLODipine (NORVASC) 10 MG tablet Take 1 tablet (10 mg total) by mouth daily. 90 tablet 3   atorvastatin (LIPITOR) 20 MG tablet Take 1 tablet (20 mg total) by mouth daily. 90 tablet 3   hydrALAZINE (APRESOLINE) 25 MG tablet Take 1 tablet (25 mg total) by mouth in the morning and at bedtime. 180 tablet 3   valsartan (DIOVAN) 160 MG tablet Take 1 tablet (160 mg total) by mouth daily. 90 tablet 3   No current facility-administered medications for this visit.    Allergies:   Patient has no known allergies.   Social History:  The patient  reports that he has never smoked. He has never used smokeless tobacco. He reports that he does not drink alcohol and does not use drugs.   Family History:  The patient's family history includes Breast cancer in his mother; Healthy in his father; Hypertension in his father.    ROS:  Please see the history of present illness.   Otherwise, review of systems is positive for none.   All other systems are reviewed and negative.   PHYSICAL EXAM: VS:  BP 136/88   Pulse 88   Ht 5\' 8"  (1.727 m)   Wt 283 lb 6.4 oz (128.5 kg)  SpO2 98%   BMI 43.09 kg/m  , BMI Body mass index is 43.09 kg/m. GEN: Well nourished, well developed, in no acute distress  HEENT: normal  Neck: no JVD, carotid bruits, or masses Cardiac: RRR; no murmurs, rubs, or gallops,no edema  Respiratory:  clear to auscultation bilaterally, normal work of breathing GI: soft, nontender, nondistended, + BS MS: no deformity or atrophy  Skin: warm and dry Neuro:  Strength and sensation are intact Psych: euthymic mood, full affect  EKG:  EKG is ordered today. Personal review of the ekg ordered shows sinus rhythm, rate 88  Recent Labs: 06/06/2021: ALT 27; Hemoglobin 14.5; Platelets 406 06/07/2021:  Magnesium 2.1; TSH 1.655 06/08/2021: BUN 27; Creatinine, Ser 1.69; Potassium 3.3; Sodium 137    Lipid Panel     Component Value Date/Time   CHOL 223 (H) 06/06/2021 0403   CHOL 186 08/02/2019 0908   TRIG 134 06/06/2021 0403   HDL 45 06/06/2021 0403   HDL 41 08/02/2019 0908   CHOLHDL 5.0 06/06/2021 0403   VLDL 27 06/06/2021 0403   LDLCALC 151 (H) 06/06/2021 0403   LDLCALC 117 (H) 08/02/2019 0908     Wt Readings from Last 3 Encounters:  12/03/21 283 lb 6.4 oz (128.5 kg)  09/24/21 287 lb 6.4 oz (130.4 kg)  09/03/21 294 lb 6.4 oz (133.5 kg)      Other studies Reviewed: Additional studies/ records that were reviewed today include: TTE 06/06/21  Review of the above records today demonstrates:   1. Left ventricular ejection fraction, by estimation, is 55 to 60%. The  left ventricle has normal function. The left ventricle has no regional  wall motion abnormalities. There is severe concentric left ventricular  hypertrophy. Left ventricular diastolic   parameters are consistent with Grade I diastolic dysfunction (impaired  relaxation). The average left ventricular global longitudinal strain is  -16.9 %. The global longitudinal strain is abnormal.   2. Right ventricular systolic function is normal. The right ventricular  size is normal. Tricuspid regurgitation signal is inadequate for assessing  PA pressure.   3. The mitral valve is normal in structure. No evidence of mitral valve  regurgitation. No evidence of mitral stenosis.   4. The aortic valve is tricuspid. Aortic valve regurgitation is not  visualized. No aortic stenosis is present.   5. Aortic dilatation noted. 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   6. The inferior vena cava is normal in size with greater than 50%  respiratory variability, suggesting right atrial pressure of 3 mmHg.   7. Negative bubble study, no evidence for PFO or ASD.   Cardiac monitor 08/22/2021 personally reviewed Sinus  pauses with high-grade AV block.   ASSESSMENT AND PLAN:  1.  Intermittent complete heart block: Had bradycardia while in the hospital.  At a monitor that showed sinus pauses with AV block.  He had previously not stopped his carvedilol.  Carvedilol has since been held.  He is felt much better since his carvedilol was held.  He has had no further episodes of near syncope.  He is quite happy with his control.  He does monitor his heart rates on a wearable.  2.  Obstructive sleep apnea: CPAP compliance encouraged  3.  Hypertension: Mildly elevated but better controlled.  4.  Morbid obesity: Diet and exercise encouraged Body mass index is 43.09 kg/m.    Current medicines are reviewed at length with the patient today.   The patient does not have concerns  regarding his medicines.  The following changes were made today: None  Labs/ tests ordered today include:  Orders Placed This Encounter  Procedures   EKG 12-Lead     Disposition:   FU with Saralynn Langhorst 6 months  Signed, Erman Thum Jorja Loa, MD  12/03/2021 9:49 AM     [facility] [address] [place] [address] [phone] (office) [phone] (fax)

## 2022-01-03 ENCOUNTER — Encounter: Payer: Self-pay | Admitting: Cardiology

## 2022-02-25 ENCOUNTER — Ambulatory Visit: Payer: Commercial Managed Care - PPO | Attending: Cardiology | Admitting: Cardiology

## 2022-02-25 ENCOUNTER — Encounter: Payer: Self-pay | Admitting: Cardiology

## 2022-02-25 VITALS — BP 146/88 | HR 100 | Ht 68.0 in | Wt 292.0 lb

## 2022-02-25 DIAGNOSIS — R079 Chest pain, unspecified: Secondary | ICD-10-CM

## 2022-02-25 DIAGNOSIS — R0789 Other chest pain: Secondary | ICD-10-CM

## 2022-02-25 DIAGNOSIS — G4733 Obstructive sleep apnea (adult) (pediatric): Secondary | ICD-10-CM

## 2022-02-25 DIAGNOSIS — I10 Essential (primary) hypertension: Secondary | ICD-10-CM

## 2022-02-25 DIAGNOSIS — E782 Mixed hyperlipidemia: Secondary | ICD-10-CM | POA: Diagnosis not present

## 2022-02-25 DIAGNOSIS — Z7689 Persons encountering health services in other specified circumstances: Secondary | ICD-10-CM

## 2022-02-25 MED ORDER — HYDRALAZINE HCL 25 MG PO TABS: 25.0000 mg | ORAL_TABLET | Freq: Two times a day (BID) | ORAL | 3 refills | Status: DC

## 2022-02-25 NOTE — Patient Instructions (Signed)
Medication Instructions:  Your physician recommends that you continue on your current medications as directed. Please refer to the Current Medication list given to you today.  *If you need a refill on your cardiac medications before your next appointment, please call your pharmacy*   Lab Work: Your physician recommends that you have the following lab drawn today: Lipid Panel  If you have labs (blood work) drawn today and your tests are completely normal, you will receive your results only by: MyChart Message (if you have MyChart) OR A paper copy in the mail If you have any lab test that is abnormal or we need to change your treatment, we will call you to review the results.   Testing/Procedures: Your physician has requested that you have an echocardiogram. Echocardiography is a painless test that uses sound waves to create images of your heart. It provides your doctor with information about the size and shape of your heart and how well your heart's chambers and valves are working. This procedure takes approximately one hour. There are no restrictions for this procedure. Please do NOT wear cologne, perfume, aftershave, or lotions (deodorant is allowed). Please arrive 15 minutes prior to your appointment time.    Follow-Up: At [facility], you and your health needs are our priority.  As part of our continuing mission to provide you with exceptional heart care, we have created designated Provider Care Teams.  These Care Teams include your primary Cardiologist (physician) and Advanced Practice Providers (APPs -  Physician Assistants and Nurse Practitioners) who all work together to provide you with the care you need, when you need it.  We recommend signing up for the patient portal called "MyChart".  Sign up information is provided on this After Visit Summary.  MyChart is used to connect with patients for Virtual Visits (Telemedicine).  Patients are able to view lab/test results,  encounter notes, upcoming appointments, etc.  Non-urgent messages can be sent to your provider as well.   To learn more about what you can do with MyChart, go to [email or web address].    Your next appointment:   6 month(s)  The format for your next appointment:   In Person  Provider:   Berniece Salines, DO

## 2022-02-25 NOTE — Progress Notes (Signed)
Cardiology Office Note:    Date:  02/25/2022   ID:  Alejandro Santos, DOB 03/31/1981, MRN [id number]  PCP:  Margo Common, PA-C (Inactive)  Cardiologist:  Berniece Salines, DO  Electrophysiologist:  Will Meredith Leeds, MD   Referring MD: No ref. provider found   " I am doing fine"  History of Present Illness:    Alejandro Santos is a 41 y.o. male with a hx of hypertension, morbid obesity, recently diagnosed obstructive sleep apnea still waiting for CPAP, intermittent complete heart block however had not stop his carvedilol-he did see EP and plans for monitoring.  At his last visit he was still experiencing lightheadedness and dizziness I placed a monitor on the patient.  His monitor did show intermittent heart block.  There may have been a misunderstanding that the patient was still taking his carvedilol.  He did see EP at that time he was still on the carvedilol so the medication was stopped.  Since that visit he has not complained of any dizziness.  He did get his sleep study still waiting on CPAP.  His complaint is mostly chest discomfort.  He tells me he is having midsternal heartburn sensation.  Comes and goes.  Sometimes it will last longer than other.  No radiation nothing makes it better or worse.  No shortness of breath associated with this.  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  Past Medical History:  Diagnosis Date   History of surgery on upper extremity    Multiple surgeries on right upper arm after an industrial accident   HTN (hypertension)     Past Surgical History:  Procedure Laterality Date   HAND SURGERY Right    right arm  x 6   KNEE SURGERY Left     Current Medications: Current Meds  Medication Sig   amLODipine (NORVASC) 10 MG tablet Take 1 tablet (10 mg total) by mouth daily.   atorvastatin (LIPITOR) 20 MG tablet Take 1 tablet (20 mg total) by mouth daily.   valsartan (DIOVAN) 160 MG tablet Take 1 tablet (160 mg total) by mouth daily.   [DISCONTINUED]  hydrALAZINE (APRESOLINE) 25 MG tablet Take 1 tablet (25 mg total) by mouth in the morning and at bedtime.     Allergies:   Patient has no known allergies.   Social History   Socioeconomic History   Marital status: Married    Spouse name: Not on file   Number of children: Not on file   Years of education: Not on file   Highest education level: Not on file  Occupational History   Not on file  Tobacco Use   Smoking status: Never   Smokeless tobacco: Never  Vaping Use   Vaping Use: Never used  Substance and Sexual Activity   Alcohol use: No   Drug use: No   Sexual activity: Not on file  Other Topics Concern   Not on file  Social History Narrative   Not on file   Social Determinants of Health   Financial Resource Strain: Not on file  Food Insecurity: Not on file  Transportation Needs: Not on file  Physical Activity: Not on file  Stress: Not on file  Social Connections: Not on file     Family History: The patient's family history includes Breast cancer in his mother; Healthy in his father; Hypertension in his father. There is no history of Kidney cancer, Bladder Cancer, or Prostate cancer.  ROS:   Review of Systems  Constitution: Negative for decreased appetite,  fever and weight gain.  HENT: Negative for congestion, ear discharge, hoarse voice and sore throat.   Eyes: Negative for discharge, redness, vision loss in right eye and visual halos.  Cardiovascular: Negative for chest pain, dyspnea on exertion, leg swelling, orthopnea and palpitations.  Respiratory: Negative for cough, hemoptysis, shortness of breath and snoring.   Endocrine: Negative for heat intolerance and polyphagia.  Hematologic/Lymphatic: Negative for bleeding problem. Does not bruise/bleed easily.  Skin: Negative for flushing, nail changes, rash and suspicious lesions.  Musculoskeletal: Negative for arthritis, joint pain, muscle cramps, myalgias, neck pain and stiffness.  Gastrointestinal: Negative for  abdominal pain, bowel incontinence, diarrhea and excessive appetite.  Genitourinary: Negative for decreased libido, genital sores and incomplete emptying.  Neurological: Negative for brief paralysis, focal weakness, headaches and loss of balance.  Psychiatric/Behavioral: Negative for altered mental status, depression and suicidal ideas.  Allergic/Immunologic: Negative for HIV exposure and persistent infections.    EKGs/Labs/Other Studies Reviewed:    The following studies were reviewed today:   EKG: None today  ZIO monitor Aug 20, 2021 Patch Wear Time:  15 days and 5 hours (2023-04-06T05:59:45-0400 to 2023-04-22T08:04:55-0400)   Monitor 1 Patient had a min HR of 21 bpm, max HR of 149 bpm, and avg HR of 95 bpm. Predominant underlying rhythm was Sinus Rhythm. First Degree AV Block was present. 8 Pauses occurred, the longest lasting 6 secs (10 bpm). Pauses occurred due to High Grade AV  Block. 4 episode(s) of AV Block (High Grade) occurred, lasting a total of 14 secs. Second Degree AV Block-Mobitz I (Wenckebach) was present. Isolated SVEs were rare (<1.0%), and no SVE Couplets or SVE Triplets were present. Isolated VEs were rare  (<1.0%), and no VE Couplets or VE Triplets were present. MD notification criteria for High Grade AV Block (6.0 seconds of Ventricular Asystole) met - report posted prior to notification per account request (AK).   Monitor 2 Patient had a min HR of 21 bpm, max HR of 231 bpm, and avg HR of 96 bpm. Predominant underlying rhythm was Sinus Rhythm. First Degree AV Block was present. 1 run of Ventricular Tachycardia occurred lasting 12 beats with a max rate of 231 bpm (avg 202  bpm). 56 Pauses occurred, the longest lasting 7.7 secs (8 bpm). Pause(s) occurred due to Possible High Grade AV Block. 47 episode(s) of AV Block (High Grade) occurred, lasting a total of 5 mins 13 secs. Second Degree AV Block-Mobitz I (Wenckebach) was  present. Wenckebach was detected within +/- 45  seconds of symptomatic patient event(s). Isolated SVEs were rare (<1.0%), SVE Couplets were rare (<1.0%), and no SVE Triplets were present. Isolated VEs were rare (<1.0%), and no VE Couplets or VE Triplets  were present. MD notification criteria for High Grade AV Block (6.0-7.7 seconds of Ventricular Asystole) met - report posted prior to notification per account request (ES).     Conclusion: Sinus pauses with high-grade AV block. He has been referred to electrophysiology for evaluation.    TTE 06/06/2021 IMPRESSIONS   1. Left ventricular ejection fraction, by estimation, is 55 to 60%. The  left ventricle has normal function. The left ventricle has no regional  wall motion abnormalities. There is severe concentric left ventricular  hypertrophy. Left ventricular diastolic   parameters are consistent with Grade I diastolic dysfunction (impaired  relaxation). The average left ventricular global longitudinal strain is  -16.9 %. The global longitudinal strain is abnormal.   2. Right ventricular systolic function is normal. The right ventricular  size  is normal. Tricuspid regurgitation signal is inadequate for assessing  PA pressure.   3. The mitral valve is normal in structure. No evidence of mitral valve  regurgitation. No evidence of mitral stenosis.   4. The aortic valve is tricuspid. Aortic valve regurgitation is not  visualized. No aortic stenosis is present.   5. Aortic dilatation noted. 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   6. The inferior vena cava is normal in size with greater than 50%  respiratory variability, suggesting right atrial pressure of 3 mmHg.   7. Negative bubble study, no evidence for PFO or ASD.   FINDINGS   Left Ventricle: Left ventricular ejection fraction, by estimation, is 55  to 60%. The left ventricle has normal function. The left ventricle has no  regional wall motion abnormalities. The average left ventricular global   longitudinal strain is -16.9 %.  The global longitudinal strain is abnormal. The left ventricular internal  cavity size was normal in size. There is severe concentric left  ventricular hypertrophy. Left ventricular diastolic parameters are  consistent with Grade I diastolic dysfunction  (impaired relaxation).   Right Ventricle: The right ventricular size is normal. No increase in  right ventricular wall thickness. Right ventricular systolic function is  normal. Tricuspid regurgitation signal is inadequate for assessing PA  pressure.   Left Atrium: Left atrial size was normal in size.   Right Atrium: Right atrial size was normal in size.   Pericardium: There is no evidence of pericardial effusion.   Mitral Valve: The mitral valve is normal in structure. No evidence of  mitral valve regurgitation. No evidence of mitral valve stenosis.   Tricuspid Valve: The tricuspid valve is normal in structure. Tricuspid  valve regurgitation is not demonstrated.   Aortic Valve: The aortic valve is tricuspid. Aortic valve regurgitation is  not visualized. No aortic stenosis is present.   Pulmonic Valve: The pulmonic valve was normal in structure. Pulmonic valve  regurgitation is not visualized.   Aorta: The aortic root is normal in size and structure and aortic dilatation noted. There is mild dilatation of the ascending aorta,  measuring 37 mm.   Venous: The inferior vena cava is normal in size with greater than 50% respiratory variability, suggesting right atrial pressure of 3 mmHg.   IAS/Shunts: Negative bubble study, no evidence for PFO or ASD. Agitated saline contrast was given intravenously to evaluate for intracardiac  shunting.    Other imaging study 2/14>> CTA head/neck: Patent vasculature of the head and neck 2/14>> CTA chest/abdomen/pelvis: No dissection-no acute findings in the chest/abdomen/pelvis. 2/15>> MRI brain: No acute CVA 2/15>> MRI C-spine: Mild degenerative  changes 2/15>> Echo: EF 55-60%, no regional wall motion abnormalities, grade 1 diastolic dysfunction    Recent Labs: 06/06/2021: ALT 27; Hemoglobin 14.5; Platelets 406 06/07/2021: Magnesium 2.1; TSH 1.655 06/08/2021: BUN 27; Creatinine, Ser 1.69; Potassium 3.3; Sodium 137  Recent Lipid Panel    Component Value Date/Time   CHOL 223 (H) 06/06/2021 0403   CHOL 186 08/02/2019 0908   TRIG 134 06/06/2021 0403   HDL 45 06/06/2021 0403   HDL 41 08/02/2019 0908   CHOLHDL 5.0 06/06/2021 0403   VLDL 27 06/06/2021 0403   LDLCALC 151 (H) 06/06/2021 0403   LDLCALC 117 (H) 08/02/2019 0908    Physical Exam:    VS:  BP (!) 146/88 (BP Location: Left Arm, Patient Position: Sitting, Cuff Size: Large)   Pulse 100   Ht _0  (1.727 m)  Wt 292 lb (132.5 kg)   SpO2 99%   BMI 44.40 kg/m     Wt Readings from Last 3 Encounters:  02/25/22 292 lb (132.5 kg)  12/03/21 283 lb 6.4 oz (128.5 kg)  09/24/21 287 lb 6.4 oz (130.4 kg)     GEN: Well nourished, well developed in no acute distress HEENT: Normal NECK: No JVD; No carotid bruits LYMPHATICS: No lymphadenopathy CARDIAC: S1S2 noted,RRR, no murmurs, rubs, gallops RESPIRATORY:  Clear to auscultation without rales, wheezing or rhonchi  ABDOMEN: Soft, non-tender, non-distended, +bowel sounds, no guarding. EXTREMITIES: No edema, No cyanosis, no clubbing MUSCULOSKELETAL:  No deformity  SKIN: Warm and dry NEUROLOGIC:  Alert and oriented x 3, non-focal PSYCHIATRIC:  Normal affect, good insight  ASSESSMENT:    1. Atypical chest pain   2. Mixed hyperlipidemia   3. Chest pain, unspecified type   4. Essential hypertension   5. Morbid obesity ([place])   6. OSA (obstructive sleep apnea)   7. 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  Hypertension-I reviewed his home blood pressure recording he has had significant improvement and is mostly at target.  I will continue valsartan 160 mg daily, continue his amlodipine 10 mg daily, continue hydralazine 25 mg twice a day.  Hopefully this will keep him less than 130/80 mmHg.  Intermittent AV block-plan for repeat monitor.  Has improved since off his beta-blocker.  OSA-he is still waiting to be set up for his CPAP titration study.  We will reach out to our sleep team about this the patient understands the need to lose weight with diet and exercise. We have discussed specific strategies for this.  The patient is in agreement with the above plan. The patient left the office in stable condition.  The patient will follow up in   Medication Adjustments/Labs and Tests Ordered: Current medicines are reviewed at length with the patient today.  Concerns regarding medicines are outlined above.  Orders Placed This Encounter  Procedures   Lipid panel   Ambulatory Referral to Primary Care   Cardiac Stress Test: Informed Consent Details: Physician/Practitioner Attestation; Transcribe to consent form and obtain patient signature   ECHOCARDIOGRAM STRESS TEST   Meds ordered this encounter  Medications   hydrALAZINE (APRESOLINE) 25 MG tablet    Sig: Take 1 tablet (25 mg total) by mouth in the morning and at bedtime.    Dispense:  180 tablet    Refill:  3    Patient Instructions  Medication Instructions:  Your physician recommends that you continue on your current medications as directed. Please refer to the Current Medication list given to you today.  *If you need a refill on your cardiac medications before your next appointment, please call your pharmacy*   Lab Work: Your physician recommends that you have the following lab drawn today: Lipid Panel  If you have labs (blood work) drawn today and your tests are completely normal,  you will receive your results only by: MyChart Message (if you have MyChart) OR A paper copy in the mail If you have any lab test that is abnormal or we need to change your treatment, we will call you to review the results.   Testing/Procedures:  Your physician has requested that you have an echocardiogram. Echocardiography is a painless test that uses sound waves to create images of your heart. It provides your doctor with information about the size and shape of your heart and how well your heart's chambers and valves are working. This procedure takes approximately one hour. There are no restrictions for this procedure. Please do NOT wear cologne, perfume, aftershave, or lotions (deodorant is allowed). Please arrive 15 minutes prior to your appointment time.    Follow-Up: At [facility], you and your health needs are our priority.  As part of our continuing mission to provide you with exceptional heart care, we have created designated Provider Care Teams.  These Care Teams include your primary Cardiologist (physician) and Advanced Practice Providers (APPs -  Physician Assistants and Nurse Practitioners) who all work together to provide you with the care you need, when you need it.  We recommend signing up for the patient portal called "MyChart".  Sign up information is provided on this After Visit Summary.  MyChart is used to connect with patients for Virtual Visits (Telemedicine).  Patients are able to view lab/test results, encounter notes, upcoming appointments, etc.  Non-urgent messages can be sent to your provider as well.   To learn more about what you can do with MyChart, go to [email or web address].    Your next appointment:   6 month(s)  The format for your next appointment:   In Person  Provider:   Berniece Salines, DO      Adopting a Healthy Lifestyle.  Know what a healthy weight is for you (roughly BMI <25) and aim to maintain this   Aim for 7+ servings of fruits and  vegetables daily   65-80+ fluid ounces of water or unsweet tea for healthy kidneys   Limit to max 1 drink of alcohol per day; avoid smoking/tobacco   Limit animal fats in diet for cholesterol and heart health - choose grass fed whenever available   Avoid highly processed foods, and foods high in saturated/trans fats   Aim for low stress - take time to unwind and care for your mental health   Aim for 150 min of moderate intensity exercise weekly for heart health, and weights twice weekly for bone health   Aim for 7-9 hours of sleep daily   When it comes to diets, agreement about the perfect plan isnt easy to find, even among the experts. Experts at the [employer] developed an idea known as the Healthy Eating Plate. Just imagine a plate divided into logical, healthy portions.   The emphasis is on diet quality:   Load up on vegetables and fruits - one-half of your plate: Aim for color and variety, and remember that potatoes dont count.   Go for whole grains - one-quarter of your plate: Whole wheat, barley, wheat berries, quinoa, oats, brown rice, and foods made with them. If you want pasta, go with whole wheat pasta.   Protein power - one-quarter of your plate: Fish, chicken, beans, and nuts are all healthy, versatile protein sources. Limit red meat.   The diet, however, does go beyond the plate, offering a few other suggestions.   Use healthy plant oils, such as olive, canola, soy, corn, sunflower and peanut. Check the labels, and avoid partially hydrogenated oil, which have unhealthy trans fats.   If youre thirsty, drink water. Coffee and tea are good in moderation, but skip sugary drinks and limit milk and dairy products  to one or two daily servings.   The type of carbohydrate in the diet is more important than the amount. Some sources of carbohydrates, such as vegetables, fruits, whole grains, and beans-are healthier than others.   Finally, stay  active  Signed, Berniece Salines, DO  02/25/2022 8:56 AM    [place]

## 2022-02-26 LAB — LIPID PANEL
Chol/HDL Ratio: 3.2 ratio (ref 0.0–5.0)
Cholesterol, Total: 149 mg/dL (ref 100–199)
HDL: 47 mg/dL (ref >39)
LDL Chol Calc (NIH): 81 mg/dL (ref 0–99)
Triglycerides: 115 mg/dL (ref 0–149)
VLDL Cholesterol Cal: 21 mg/dL (ref 5–40)

## 2022-03-13 ENCOUNTER — Telehealth (HOSPITAL_COMMUNITY): Payer: Self-pay | Admitting: *Deleted

## 2022-03-13 NOTE — Telephone Encounter (Signed)
Left message on voicemail per DPR in reference to upcoming appointment scheduled on  03/19/22 with detailed instructions  LM to arrive 15 minutes early, and that it is imperative to arrive on time for appointment to keep from having the test rescheduled. If you need to cancel or reschedule your appointment, please call the office within 24 hours of your appointment. Failure to do so may result in a cancellation of your appointment, and a $50 no show fee. Phone number given for call back for any questions.

## 2022-03-19 ENCOUNTER — Ambulatory Visit (HOSPITAL_BASED_OUTPATIENT_CLINIC_OR_DEPARTMENT_OTHER): Payer: Commercial Managed Care - PPO

## 2022-03-19 ENCOUNTER — Ambulatory Visit (HOSPITAL_COMMUNITY): Payer: Commercial Managed Care - PPO | Attending: Cardiovascular Disease

## 2022-03-19 DIAGNOSIS — R079 Chest pain, unspecified: Secondary | ICD-10-CM | POA: Diagnosis present

## 2022-03-19 MED ORDER — PERFLUTREN LIPID MICROSPHERE
1.0000 mL | INTRAVENOUS | Status: AC | PRN
  Administered 2022-03-19 (×3): 2 mL via INTRAVENOUS

## 2022-03-20 LAB — ECHOCARDIOGRAM STRESS TEST
Area-P 1/2: 3.21 cm2
S' Lateral: 2.7 cm

## 2022-05-03 ENCOUNTER — Telehealth: Payer: Self-pay | Admitting: *Deleted

## 2022-05-03 NOTE — Telephone Encounter (Signed)
Note given to me yesterday by [facility] to check on patient's CPAP machine. I reached out to [facility] on today and per Francoise Ceo they reached out to the patient several times in June with no response or call back from him. Order was sent to [facility] in October  due to company no longer doing CPAP. Ivin Booty found out afterwards that [facility] does not take the patient's insurance. She has never heard back from [facility]  saying if they contacted the patient or forwarded the order on to another DME company. I will contact the patient to see if he has heard from anyone. If not I will send the order to a participating DME company.

## 2022-05-03 NOTE — Telephone Encounter (Signed)
Order for CPAP sent to [facility].

## 2022-07-15 ENCOUNTER — Encounter: Payer: Self-pay | Admitting: Cardiology

## 2022-07-15 ENCOUNTER — Ambulatory Visit: Payer: Commercial Managed Care - PPO | Attending: Cardiology | Admitting: Cardiology

## 2022-07-15 VITALS — BP 148/88 | HR 111 | Ht 68.0 in | Wt 292.0 lb

## 2022-07-15 DIAGNOSIS — I442 Atrioventricular block, complete: Secondary | ICD-10-CM

## 2022-07-15 DIAGNOSIS — G4733 Obstructive sleep apnea (adult) (pediatric): Secondary | ICD-10-CM | POA: Diagnosis not present

## 2022-07-15 DIAGNOSIS — I10 Essential (primary) hypertension: Secondary | ICD-10-CM

## 2022-07-15 NOTE — Patient Instructions (Signed)
Medication Instructions:  Your physician recommends that you continue on your current medications as directed. Please refer to the Current Medication list given to you today. *If you need a refill on your cardiac medications before your next appointment, please call your pharmacy*   Follow-Up: At [facility], you and your health needs are our priority.  As part of our continuing mission to provide you with exceptional heart care, we have created designated Provider Care Teams.  These Care Teams include your primary Cardiologist (physician) and Advanced Practice Providers (APPs -  Physician Assistants and Nurse Practitioners) who all work together to provide you with the care you need, when you need it.   Your next appointment:   As needed   Provider:   Allegra Lai, MD

## 2022-07-15 NOTE — Progress Notes (Signed)
Electrophysiology Office Note   Date:  07/15/2022   ID:  Alejandro Santos, DOB November 28, 1980, MRN [id number]  PCP:  Margo Common, PA-C (Inactive)  Cardiologist:  Tobb Primary Electrophysiologist:  Raevin Wierenga Meredith Leeds, MD    Chief Complaint: heart block   History of Present Illness: Alejandro Santos is a 42 y.o. male who is being seen today for the evaluation of heart block at the request of No ref. provider found. Presenting today for electrophysiology evaluation.  He has a history significant hypertension, morbid obesity.  He was hospitalized 06/03/2021.  He presented with nausea, shortness of breath, 7 out of 10 squeezing chest pain.  Tingling in his fingers.  Heart rate was 160.  Troponins were negative.  He was found to be hypertensive.  Head CT and MRI without abnormality.  He was noted to have pauses on telemetry.  He had been out of his blood pressure medications for several weeks.  He was started on losartan, amlodipine, carvedilol.  He wore a cardiac monitor that showed sinus bradycardia as well as pauses.  He stopped his carvedilol and felt much improved.  Today, denies symptoms of palpitations, chest pain, shortness of breath, orthopnea, PND, lower extremity edema, claudication, dizziness, presyncope, syncope, bleeding, or neurologic sequela. The patient is tolerating medications without difficulties.  Today he feels well.  He does note that his heart rate has been fast at times.  This he says occurs when he is exerting himself.  He can rest and his heart rate comes back down.  He does note intermittent palpitations.  Aside from that, he is doing well without major complaint.  His blood pressure is elevated today.  When he checked it at home this morning it was in the [id number] systolic.   Past Medical History:  Diagnosis Date   History of surgery on upper extremity    Multiple surgeries on right upper arm after an industrial accident   HTN (hypertension)    Past Surgical History:   Procedure Laterality Date   HAND SURGERY Right    right arm  x 6   KNEE SURGERY Left      Current Outpatient Medications  Medication Sig Dispense Refill   amLODipine (NORVASC) 10 MG tablet Take 1 tablet (10 mg total) by mouth daily. 90 tablet 3   atorvastatin (LIPITOR) 20 MG tablet Take 1 tablet (20 mg total) by mouth daily. 90 tablet 3   hydrALAZINE (APRESOLINE) 25 MG tablet Take 1 tablet (25 mg total) by mouth in the morning and at bedtime. 180 tablet 3   valsartan (DIOVAN) 160 MG tablet Take 1 tablet (160 mg total) by mouth daily. 90 tablet 3   No current facility-administered medications for this visit.    Allergies:   Patient has no known allergies.   Social History:  The patient  reports that he has never smoked. He has never used smokeless tobacco. He reports that he does not drink alcohol and does not use drugs.   Family History:  The patient's family history includes Breast cancer in his mother; Healthy in his father; Hypertension in his father.   ROS:  Please see the history of present illness.   Otherwise, review of systems is positive for none.   All other systems are reviewed and negative.   PHYSICAL EXAM: VS:  BP (!) 148/88   Pulse (!) 111   Ht 5\' 8"  (1.727 m)   Wt 292 lb (132.5 kg)   SpO2 98%   BMI 44.40 kg/m  ,  BMI Body mass index is 44.4 kg/m. GEN: Well nourished, well developed, in no acute distress  HEENT: normal  Neck: no JVD, carotid bruits, or masses Cardiac: RRR; no murmurs, rubs, or gallops,no edema  Respiratory:  clear to auscultation bilaterally, normal work of breathing GI: soft, nontender, nondistended, + BS MS: no deformity or atrophy  Skin: warm and dry Neuro:  Strength and sensation are intact Psych: euthymic mood, full affect  EKG:  EKG is ordered today. Personal review of the ekg ordered shows sinus rhythm, PVCs  Recent Labs: No results found for requested labs within last 365 days.    Lipid Panel     Component Value Date/Time    CHOL 149 02/25/2022 1457   TRIG 115 02/25/2022 1457   HDL 47 02/25/2022 1457   CHOLHDL 3.2 02/25/2022 1457   CHOLHDL 5.0 06/06/2021 0403   VLDL 27 06/06/2021 0403   LDLCALC 81 02/25/2022 1457     Wt Readings from Last 3 Encounters:  07/15/22 292 lb (132.5 kg)  02/25/22 292 lb (132.5 kg)  12/03/21 283 lb 6.4 oz (128.5 kg)      Other studies Reviewed: Additional studies/ records that were reviewed today include: TTE 06/06/21  Review of the above records today demonstrates:   1. Left ventricular ejection fraction, by estimation, is 55 to 60%. The  left ventricle has normal function. The left ventricle has no regional  wall motion abnormalities. There is severe concentric left ventricular  hypertrophy. Left ventricular diastolic   parameters are consistent with Grade I diastolic dysfunction (impaired  relaxation). The average left ventricular global longitudinal strain is  -16.9 %. The global longitudinal strain is abnormal.   2. Right ventricular systolic function is normal. The right ventricular  size is normal. Tricuspid regurgitation signal is inadequate for assessing  PA pressure.   3. The mitral valve is normal in structure. No evidence of mitral valve  regurgitation. No evidence of mitral stenosis.   4. The aortic valve is tricuspid. Aortic valve regurgitation is not  visualized. No aortic stenosis is present.   5. Aortic dilatation noted. 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   6. The inferior vena cava is normal in size with greater than 50%  respiratory variability, suggesting right atrial pressure of 3 mmHg.   7. Negative bubble study, no evidence for PFO or ASD.   Cardiac monitor 08/22/2021 personally reviewed Sinus pauses with high-grade AV block.   ASSESSMENT AND PLAN:  1.  Intermittent complete heart block: Had bradycardia while in the hospital.  Cardiac monitor showed sinus pauses with AV block.  He had previously stopped his  carvedilol.  He felt much better off of carvedilol and has had no further episodes of near syncope.  2.  Obstructive sleep apnea: CPAP compliance encouraged  3.  Hypertension: Elevated today.  Normal at home.  Plan per primary cardiology.  Recheck with improvement in blood pressure.  4.  Morbid obesity: Lifestyle modification encouraged Body mass index is 44.4 kg/m.    Current medicines are reviewed at length with the patient today.   The patient does not have concerns regarding his medicines.  The following changes were made today: none  Labs/ tests ordered today include:  Orders Placed This Encounter  Procedures   EKG 12-Lead     Disposition:   FU PRN months  Signed, Adian Jablonowski Meredith Leeds, MD  07/15/2022 9:48 AM     [address]  [place] [address] [phone] (office) [phone] (fax)

## 2022-08-26 ENCOUNTER — Ambulatory Visit: Payer: Commercial Managed Care - PPO | Admitting: Cardiology

## 2022-09-06 ENCOUNTER — Ambulatory Visit: Payer: Commercial Managed Care - PPO | Attending: Cardiology | Admitting: Cardiology

## 2022-09-06 VITALS — BP 142/88 | HR 98 | Ht 68.0 in | Wt 289.6 lb

## 2022-09-06 DIAGNOSIS — Z7689 Persons encountering health services in other specified circumstances: Secondary | ICD-10-CM | POA: Diagnosis not present

## 2022-09-06 DIAGNOSIS — I10 Essential (primary) hypertension: Secondary | ICD-10-CM | POA: Diagnosis not present

## 2022-09-06 DIAGNOSIS — G4733 Obstructive sleep apnea (adult) (pediatric): Secondary | ICD-10-CM

## 2022-09-06 NOTE — Patient Instructions (Signed)
Medication Instructions:  Your physician recommends that you continue on your current medications as directed. Please refer to the Current Medication list given to you today.  *If you need a refill on your cardiac medications before your next appointment, please call your pharmacy*   Lab Work: None   Testing/Procedures: None   Follow-Up: At [facility], you and your health needs are our priority.  As part of our continuing mission to provide you with exceptional heart care, we have created designated Provider Care Teams.  These Care Teams include your primary Cardiologist (physician) and Advanced Practice Providers (APPs -  Physician Assistants and Nurse Practitioners) who all work together to provide you with the care you need, when you need it.   Your next appointment:   1 year(s)  Provider:   Kardie Tobb, DO   

## 2022-09-06 NOTE — Progress Notes (Signed)
Cardiology Office Note:    Date:  09/06/2022   ID:  Alejandro Santos, DOB [birth year], MRN [id number]  PCP:  Tamsen Roers, PA-C (Inactive)  Cardiologist:  Thomasene Ripple, DO  Electrophysiologist:  None   Referring MD: No ref. provider found   " I am doing fine"  History of Present Illness:    Alejandro Santos is a 42 y.o. male with a hx of hypertension, morbid obesity, recently diagnosed obstructive sleep apnea still waiting for CPAP, intermittent complete heart block however had not stop his carvedilol-he did see EP and plans for monitoring.  At his last visit he was still experiencing lightheadedness and dizziness I placed a monitor on the patient.  His monitor did show intermittent heart block.  There may have been a misunderstanding that the patient was still taking his carvedilol.  He did see EP at that time he was still on the carvedilol so the medication was stopped.  Since that visit he has not complained of any dizziness.  He did get his sleep study still waiting on CPAP.  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  He did get this testing done which was normal.  I was able to fill out the form for the patient.  Unfortunately he was told that needed different for comprehensive physical.  He does not have a PCP.  And we will be referring him to a PCP to help for this comprehensive testing.  He is very happy with the CPAP.  He offers no other complaints at this time.   Past Medical History:  Diagnosis Date   History of surgery on upper extremity    Multiple surgeries on right upper arm after an industrial accident   HTN (hypertension)     Past Surgical History:  Procedure Laterality Date   HAND SURGERY Right    right arm  x 6   KNEE SURGERY Left     Current Medications: Current Meds  Medication Sig   amLODipine (NORVASC) 10 MG tablet Take 1 tablet  (10 mg total) by mouth daily.   atorvastatin (LIPITOR) 20 MG tablet Take 1 tablet (20 mg total) by mouth daily.   hydrALAZINE (APRESOLINE) 25 MG tablet Take 1 tablet (25 mg total) by mouth in the morning and at bedtime.   valsartan (DIOVAN) 160 MG tablet Take 1 tablet (160 mg total) by mouth daily.     Allergies:   Patient has no known allergies.   Social History   Socioeconomic History   Marital status: Married    Spouse name: Not on file   Number of children: Not on file   Years of education: Not on file   Highest education level: Not on file  Occupational History   Not on file  Tobacco Use   Smoking status: Never   Smokeless tobacco: Never  Vaping Use   Vaping Use: Never used  Substance and Sexual Activity   Alcohol use: No   Drug use: No   Sexual activity: Not on file  Other Topics Concern   Not on file  Social History Narrative   Not on file   Social Determinants of Health   Financial Resource Strain: Not on file  Food Insecurity: Not on file  Transportation Needs: Not on file  Physical Activity: Not on file  Stress: Not on file  Social Connections: Not on  file     Family History: The patient's family history includes Breast cancer in his mother; Healthy in his father; Hypertension in his father. There is no history of Kidney cancer, Bladder Cancer, or Prostate cancer.  ROS:   Review of Systems  Constitution: Negative for decreased appetite, fever and weight gain.  HENT: Negative for congestion, ear discharge, hoarse voice and sore throat.   Eyes: Negative for discharge, redness, vision loss in right eye and visual halos.  Cardiovascular: Negative for chest pain, dyspnea on exertion, leg swelling, orthopnea and palpitations.  Respiratory: Negative for cough, hemoptysis, shortness of breath and snoring.   Endocrine: Negative for heat intolerance and polyphagia.  Hematologic/Lymphatic: Negative for bleeding problem. Does not bruise/bleed easily.  Skin:  Negative for flushing, nail changes, rash and suspicious lesions.  Musculoskeletal: Negative for arthritis, joint pain, muscle cramps, myalgias, neck pain and stiffness.  Gastrointestinal: Negative for abdominal pain, bowel incontinence, diarrhea and excessive appetite.  Genitourinary: Negative for decreased libido, genital sores and incomplete emptying.  Neurological: Negative for brief paralysis, focal weakness, headaches and loss of balance.  Psychiatric/Behavioral: Negative for altered mental status, depression and suicidal ideas.  Allergic/Immunologic: Negative for HIV exposure and persistent infections.    EKGs/Labs/Other Studies Reviewed:    The following studies were reviewed today:   EKG: None today  ZIO monitor Aug 20, 2021 Patch Wear Time:  15 days and 5 hours (2023-04-06T05:59:45-0400 to 2023-04-22T08:04:55-0400)   Monitor 1 Patient had a min HR of 21 bpm, max HR of 149 bpm, and avg HR of 95 bpm. Predominant underlying rhythm was Sinus Rhythm. First Degree AV Block was present. 8 Pauses occurred, the longest lasting 6 secs (10 bpm). Pauses occurred due to High Grade AV  Block. 4 episode(s) of AV Block (High Grade) occurred, lasting a total of 14 secs. Second Degree AV Block-Mobitz I (Wenckebach) was present. Isolated SVEs were rare (<1.0%), and no SVE Couplets or SVE Triplets were present. Isolated VEs were rare  (<1.0%), and no VE Couplets or VE Triplets were present. MD notification criteria for High Grade AV Block (6.0 seconds of Ventricular Asystole) met - report posted prior to notification per account request (AK).   Monitor 2 Patient had a min HR of 21 bpm, max HR of 231 bpm, and avg HR of 96 bpm. Predominant underlying rhythm was Sinus Rhythm. First Degree AV Block was present. 1 run of Ventricular Tachycardia occurred lasting 12 beats with a max rate of 231 bpm (avg 202  bpm). 56 Pauses occurred, the longest lasting 7.7 secs (8 bpm). Pause(s) occurred due to Possible  High Grade AV Block. 47 episode(s) of AV Block (High Grade) occurred, lasting a total of 5 mins 13 secs. Second Degree AV Block-Mobitz I (Wenckebach) was  present. Wenckebach was detected within +/- 45 seconds of symptomatic patient event(s). Isolated SVEs were rare (<1.0%), SVE Couplets were rare (<1.0%), and no SVE Triplets were present. Isolated VEs were rare (<1.0%), and no VE Couplets or VE Triplets  were present. MD notification criteria for High Grade AV Block (6.0-7.7 seconds of Ventricular Asystole) met - report posted prior to notification per account request (ES).     Conclusion: Sinus pauses with high-grade AV block. He has been referred to electrophysiology for evaluation.    TTE 06/06/2021 IMPRESSIONS   1. Left ventricular ejection fraction, by estimation, is 55 to 60%. The  left ventricle has normal function. The left ventricle has no regional  wall motion abnormalities. There is severe concentric left  ventricular  hypertrophy. Left ventricular diastolic   parameters are consistent with Grade I diastolic dysfunction (impaired  relaxation). The average left ventricular global longitudinal strain is  -16.9 %. The global longitudinal strain is abnormal.   2. Right ventricular systolic function is normal. The right ventricular  size is normal. Tricuspid regurgitation signal is inadequate for assessing  PA pressure.   3. The mitral valve is normal in structure. No evidence of mitral valve  regurgitation. No evidence of mitral stenosis.   4. The aortic valve is tricuspid. Aortic valve regurgitation is not  visualized. No aortic stenosis is present.   5. Aortic dilatation noted. 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   6. The inferior vena cava is normal in size with greater than 50%  respiratory variability, suggesting right atrial pressure of 3 mmHg.   7. Negative bubble study, no evidence for PFO or ASD.   FINDINGS   Left Ventricle: Left ventricular  ejection fraction, by estimation, is 55  to 60%. The left ventricle has normal function. The left ventricle has no  regional wall motion abnormalities. The average left ventricular global  longitudinal strain is -16.9 %.  The global longitudinal strain is abnormal. The left ventricular internal  cavity size was normal in size. There is severe concentric left  ventricular hypertrophy. Left ventricular diastolic parameters are  consistent with Grade I diastolic dysfunction  (impaired relaxation).   Right Ventricle: The right ventricular size is normal. No increase in  right ventricular wall thickness. Right ventricular systolic function is  normal. Tricuspid regurgitation signal is inadequate for assessing PA  pressure.   Left Atrium: Left atrial size was normal in size.   Right Atrium: Right atrial size was normal in size.   Pericardium: There is no evidence of pericardial effusion.   Mitral Valve: The mitral valve is normal in structure. No evidence of  mitral valve regurgitation. No evidence of mitral valve stenosis.   Tricuspid Valve: The tricuspid valve is normal in structure. Tricuspid  valve regurgitation is not demonstrated.   Aortic Valve: The aortic valve is tricuspid. Aortic valve regurgitation is  not visualized. No aortic stenosis is present.   Pulmonic Valve: The pulmonic valve was normal in structure. Pulmonic valve  regurgitation is not visualized.   Aorta: The aortic root is normal in size and structure and aortic dilatation noted. There is mild dilatation of the ascending aorta,  measuring 37 mm.   Venous: The inferior vena cava is normal in size with greater than 50% respiratory variability, suggesting right atrial pressure of 3 mmHg.   IAS/Shunts: Negative bubble study, no evidence for PFO or ASD. Agitated saline contrast was given intravenously to evaluate for intracardiac  shunting.    Other imaging study 2/14>> CTA head/neck: Patent vasculature of the  head and neck 2/14>> CTA chest/abdomen/pelvis: No dissection-no acute findings in the chest/abdomen/pelvis. 2/15>> MRI brain: No acute CVA 2/15>> MRI C-spine: Mild degenerative changes 2/15>> Echo: EF 55-60%, no regional wall motion abnormalities, grade 1 diastolic dysfunction    Recent Labs: No results found for requested labs within last 365 days.  Recent Lipid Panel    Component Value Date/Time   CHOL 149 02/25/2022 1457   TRIG 115 02/25/2022 1457   HDL 47 02/25/2022 1457   CHOLHDL 3.2 02/25/2022 1457   CHOLHDL 5.0 06/06/2021 0403   VLDL 27 06/06/2021 0403   LDLCALC 81 02/25/2022 1457    Physical Exam:    VS:  BP Marland Kitchen)  142/88 (BP Location: Left Arm, Patient Position: Sitting, Cuff Size: Large)   Pulse 98   Ht 5\' 8"  (1.727 m)   Wt 289 lb 9.6 oz (131.4 kg)   SpO2 96%   BMI 44.03 kg/m     Wt Readings from Last 3 Encounters:  09/06/22 289 lb 9.6 oz (131.4 kg)  07/15/22 292 lb (132.5 kg)  02/25/22 292 lb (132.5 kg)     GEN: Well nourished, well developed in no acute distress HEENT: Normal NECK: No JVD; No carotid bruits LYMPHATICS: No lymphadenopathy CARDIAC: S1S2 noted,RRR, no murmurs, rubs, gallops RESPIRATORY:  Clear to auscultation without rales, wheezing or rhonchi  ABDOMEN: Soft, non-tender, non-distended, +bowel sounds, no guarding. EXTREMITIES: No edema, No cyanosis, no clubbing MUSCULOSKELETAL:  No deformity  SKIN: Warm and dry NEUROLOGIC:  Alert and oriented x 3, non-focal PSYCHIATRIC:  Normal affect, good insight  ASSESSMENT:    1. Encounter to establish care with new doctor   2. Essential hypertension   3. Morbid obesity (HCC)   4. 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  From a cardiovascular perspective he can drive (I have previously approve his DOT physical) but unfortunately part of the recommendations is for him to get a full physical exam with primary provider.  [facility] to there.  Intermittent AV block-plan for repeat monitor.  Has improved since off his beta-blocker.  He follows with EP  OSA-uses his CPAP and tells me is very beneficial.  Blood pressure in office is at target we will keep him on his current medication regimen which includes hydralazine 25 mg twice a day, amlodipine 10 mg daily and valsartan 160 mg daily.   Spoke with the patient he is agreeable to participate in African-American heart study.  The patient is in agreement with the above plan. The patient left the office in stable condition.  The patient will follow up in 1 year    Medication Adjustments/Labs and Tests Ordered: Current medicines are reviewed at length with the patient today.  Concerns regarding medicines are outlined above.  Orders Placed This Encounter  Procedures   Ambulatory referral to [facility]   EKG 12-Lead   No orders of the defined types were placed in this encounter.   Patient Instructions  Medication Instructions:  Your physician recommends that you continue on your current medications as directed. Please refer to the Current Medication list given to you today.  *If you need a refill on your cardiac medications before your next appointment, please call your pharmacy*   Lab Work: None   Testing/Procedures: None   Follow-Up: At [facility], you and your health needs are our priority.  As part of our continuing mission to provide you with exceptional heart care, we have created designated Provider Care Teams.  These Care Teams include your primary Cardiologist (physician) and Advanced Practice Providers (APPs -  Physician Assistants and Nurse Practitioners) who all work together to provide you with the care you need, when you need it.   Your next appointment:   1 year(s)  Provider:   Thomasene Ripple, DO      Adopting a Healthy Lifestyle.  Know what a healthy weight is for you (roughly BMI <25) and aim to  maintain this   Aim for 7+ servings of fruits and vegetables daily   65-80+ fluid ounces of water or  unsweet tea for healthy kidneys   Limit to max 1 drink of alcohol per day; avoid smoking/tobacco   Limit animal fats in diet for cholesterol and heart health - choose grass fed whenever available   Avoid highly processed foods, and foods high in saturated/trans fats   Aim for low stress - take time to unwind and care for your mental health   Aim for 150 min of moderate intensity exercise weekly for heart health, and weights twice weekly for bone health   Aim for 7-9 hours of sleep daily   When it comes to diets, agreement about the perfect plan isnt easy to find, even among the experts. Experts at the [facility] [employer] developed an idea known as the Healthy Eating Plate. Just imagine a plate divided into logical, healthy portions.   The emphasis is on diet quality:   Load up on vegetables and fruits - one-half of your plate: Aim for color and variety, and remember that potatoes dont count.   Go for whole grains - one-quarter of your plate: Whole wheat, barley, wheat berries, quinoa, oats, brown rice, and foods made with them. If you want pasta, go with whole wheat pasta.   Protein power - one-quarter of your plate: Fish, chicken, beans, and nuts are all healthy, versatile protein sources. Limit red meat.   The diet, however, does go beyond the plate, offering a few other suggestions.   Use healthy plant oils, such as olive, canola, soy, corn, sunflower and peanut. Check the labels, and avoid partially hydrogenated oil, which have unhealthy trans fats.   If youre thirsty, drink water. Coffee and tea are good in moderation, but skip sugary drinks and limit milk and dairy products to one or two daily servings.   The type of carbohydrate in the diet is more important than the amount. Some sources of carbohydrates, such as vegetables, fruits, whole grains, and beans-are  healthier than others.   Finally, stay active  Signed, Thomasene Ripple, DO  09/06/2022 12:06 PM    [facility]

## 2022-09-08 ENCOUNTER — Other Ambulatory Visit: Payer: Self-pay | Admitting: Cardiology

## 2022-09-12 ENCOUNTER — Encounter (HOSPITAL_BASED_OUTPATIENT_CLINIC_OR_DEPARTMENT_OTHER): Payer: Self-pay | Admitting: Family Medicine

## 2022-09-12 ENCOUNTER — Ambulatory Visit (HOSPITAL_BASED_OUTPATIENT_CLINIC_OR_DEPARTMENT_OTHER): Payer: Commercial Managed Care - PPO | Admitting: Family Medicine

## 2022-09-12 VITALS — BP 151/97 | HR 92 | Ht 68.0 in | Wt 284.7 lb

## 2022-09-12 DIAGNOSIS — I10 Essential (primary) hypertension: Secondary | ICD-10-CM

## 2022-09-12 DIAGNOSIS — Z7689 Persons encountering health services in other specified circumstances: Secondary | ICD-10-CM

## 2022-09-12 DIAGNOSIS — G4733 Obstructive sleep apnea (adult) (pediatric): Secondary | ICD-10-CM

## 2022-09-12 DIAGNOSIS — I442 Atrioventricular block, complete: Secondary | ICD-10-CM | POA: Diagnosis not present

## 2022-09-12 DIAGNOSIS — E782 Mixed hyperlipidemia: Secondary | ICD-10-CM | POA: Diagnosis not present

## 2022-09-12 HISTORY — DX: Persons encountering health services in other specified circumstances: Z76.89

## 2022-09-12 NOTE — Progress Notes (Signed)
New Patient Office Visit  Subjective    Patient ID: Alejandro Santos, male    DOB: 29-Dec-1980  Age: 42 y.o. MRN: [id number]  HPI Alejandro Santos is a 42 yo male who presents to establish care. Past medical history includes intermittent complete heart block, HTN, morbid obesity, and OSA with CPAP.   Former PCP: Dortha Kern, PA-C, saw provider about 5 years ago   HTN: amlodipine 10mg  QD, valsartan 160mg  QD, hydralazine 25mg  BID Reports he was hospitalized in Feb 2023 for sinus pause and, since, has been diagnosed with intermittent complete heart block. He reports also having a stroke at this time. No residual effects He was taking carvedilol and was having more issues with dizziness and lightheadedness. He reports he still feels this, along with palpitations and increased HR, with exertion but it is "under better control now."  "He was hospitalized 06/03/2021. He presented with nausea, shortness of breath, 7 out of 10 squeezing chest pain. Tingling in his fingers. Heart rate was 160. Troponins were negative. He was found to be hypertensive. Head CT and MRI without abnormality. He was noted to have pauses on telemetry. He had been out of his blood pressure medications for several weeks. He was started on losartan, amlodipine, carvedilol. He wore a cardiac monitor that showed sinus bradycardia as well as pauses. He stopped his carvedilol and felt much improved."  06/06/21: TTE with echo. 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 Trying to switch occupations (looking to have DOT physical exam completed) since he may need a permanent pacemaker.   Outpatient Encounter Medications as of 09/12/2022  Medication Sig   amLODipine (NORVASC) 10 MG tablet Take 1 tablet (10 mg total) by mouth daily.   atorvastatin (LIPITOR) 20 MG tablet Take 1 tablet (20 mg total) by mouth daily.   hydrALAZINE  (APRESOLINE) 25 MG tablet Take 1 tablet (25 mg total) by mouth in the morning and at bedtime.   valsartan (DIOVAN) 160 MG tablet TAKE 1 TABLET BY MOUTH EVERY DAY   No facility-administered encounter medications on file as of 09/12/2022.   Past Medical History:  Diagnosis Date   CVA (cerebral vascular accident) (HCC)    Encounter to establish care 09/12/2022   History of surgery on upper extremity    Multiple surgeries on right upper arm after an industrial accident   HTN (hypertension)    Intermittent complete heart block (HCC)    OSA on CPAP    Sleep apnea February 2024   Past Surgical History:  Procedure Laterality Date   FRACTURE SURGERY  05/2008   Metal plates in right forearm   HAND SURGERY Right    right arm  x 6   KNEE SURGERY Left    Family History  Problem Relation Age of Onset   Breast cancer Mother    Cancer Mother    Healthy Father    Hypertension Father    Kidney cancer Neg Hx    Bladder Cancer Neg Hx    Prostate cancer Neg Hx    Social History   Socioeconomic History   Marital status: Married    Spouse name: Not on file   Number of children: Not on file   Years of education: Not on file   Highest education level: Not on file  Occupational History   Not on file  Tobacco Use   Smoking status: Never   Smokeless  tobacco: Never  Vaping Use   Vaping Use: Never used  Substance and Sexual Activity   Alcohol use: No   Drug use: No   Sexual activity: Yes    Birth control/protection: Condom  Other Topics Concern   Not on file  Social History Narrative   Not on file   Social Determinants of Health   Financial Resource Strain: Not on file  Food Insecurity: Not on file  Transportation Needs: Not on file  Physical Activity: Not on file  Stress: Not on file  Social Connections: Not on file  Intimate Partner Violence: Not on file    Review of Systems  Constitutional:  Negative for malaise/fatigue.  Eyes:  Negative for blurred vision and double  vision.  Respiratory:  Negative for cough and shortness of breath.   Cardiovascular:  Negative for chest pain, palpitations (not at this time), orthopnea, leg swelling and PND.  Gastrointestinal:  Negative for abdominal pain, heartburn, nausea and vomiting.  Musculoskeletal:  Negative for myalgias.  Neurological:  Negative for dizziness, speech change, weakness and headaches.  Psychiatric/Behavioral:  Negative for depression and suicidal ideas. The patient is not nervous/anxious and does not have insomnia.    Objective    BP (!) 151/97 (BP Location: Left Arm, Patient Position: Sitting)   Pulse 92   Ht 5\' 8"  (1.727 m)   Wt 284 lb 11.2 oz (129.1 kg)   SpO2 100%   BMI 43.29 kg/m   Physical Exam Constitutional:      Appearance: Normal appearance.  Cardiovascular:     Rate and Rhythm: Normal rate and regular rhythm.     Pulses: Normal pulses.     Heart sounds: Normal heart sounds.  Pulmonary:     Effort: Pulmonary effort is normal.     Breath sounds: Normal breath sounds.  Neurological:     Mental Status: He is alert.  Psychiatric:        Mood and Affect: Mood normal.        Behavior: Behavior normal.        Thought Content: Thought content normal.        Judgment: Judgment normal.    Assessment & Plan:  1. Encounter to establish care Patient presents today to establish care with a new primary care provider. I reviewed the past medical history, family history, social history, surgical history, and allergies today. Patient does not have concerns today. 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 Provided him with number and location of [place] facility where he can have this completed.  - CBC with Differential/Platelet - Comprehensive metabolic panel - TSH Rfx on Abnormal to Free T4 - Hemoglobin A1c - Lipid panel  2. Essential hypertension Patient presents today with elevated blood pressure with initial reading and recheck. BP readings are within normal limits are home.  Patient in no acute distress and is well-appearing. Denies chest pain, shortness of breath, lower extremity edema, vision changes, headaches.  Cardiovascular exam with heart regular rate and rhythm. Normal heart sounds, no murmurs present. No lower extremity edema present. Lungs clear to auscultation bilaterally. Patient is currently taking amlodipine 10mg  daily, valsartan 160mg  daily, hydralazine 25mg  twice daily. No refills needed. Advised patient to continue to monitor blood pressure at home.   3. Morbid obesity (HCC)  Denies polydipsia, polyphagia, polyuria. Will check hemoglobin A1c today. Lifestyle modifications encouraged. May benefit from GLP-1 medications due to associated protection against cardiovascular disease.   4. Mixed hyperlipidemia Patient currently taking Lipitor 20mg  daily. Will check  lipid panel today.   5. OSA on CPAP Patient recently had sleep study performed and uses his CPAP nightly. Compliance encouraged.   6. Intermittent complete heart block [facility]) Patient followed by cardiology (Dr. Andee Poles), recently seen 09/06/2022- has annual follow-up appointment scheduled. He was seen by electrophysiologist (Dr. Andree Coss [facility]) on 07/15/2022 and instructed to follow-up as needed. He has felt better not taking carvedilol and has had no episodes of near syncope. Will continue current medication regimen of amlodipine 10mg  daily, valsartan 160mg  daily, hydralazine 25mg  twice daily.      Return in about 3 months (around 12/13/2022) for HTN follow-up.   Alyson Reedy, FNP

## 2022-09-13 LAB — CBC WITH DIFFERENTIAL/PLATELET
Basophils Absolute: 0 10*3/uL (ref 0.0–0.2)
Basos: 0 %
EOS (ABSOLUTE): 0.1 10*3/uL (ref 0.0–0.4)
Eos: 1 %
Hematocrit: 44.9 % (ref 37.5–51.0)
Hemoglobin: 14.7 g/dL (ref 13.0–17.7)
Immature Grans (Abs): 0 10*3/uL (ref 0.0–0.1)
Immature Granulocytes: 0 %
Lymphocytes Absolute: 2.4 10*3/uL (ref 0.7–3.1)
Lymphs: 31 %
MCH: 28.1 pg (ref 26.6–33.0)
MCHC: 32.7 g/dL (ref 31.5–35.7)
MCV: 86 fL (ref 79–97)
Monocytes Absolute: 0.8 10*3/uL (ref 0.1–0.9)
Monocytes: 11 %
Neutrophils Absolute: 4.3 10*3/uL (ref 1.4–7.0)
Neutrophils: 57 %
Platelets: 435 10*3/uL (ref 150–450)
RBC: 5.23 x10E6/uL (ref 4.14–5.80)
RDW: 13.4 % (ref 11.6–15.4)
WBC: 7.5 10*3/uL (ref 3.4–10.8)

## 2022-09-13 LAB — COMPREHENSIVE METABOLIC PANEL
ALT: 25 IU/L (ref 0–44)
AST: 24 IU/L (ref 0–40)
Albumin/Globulin Ratio: 1.3 (ref 1.2–2.2)
Albumin: 4.5 g/dL (ref 4.1–5.1)
Alkaline Phosphatase: 86 IU/L (ref 44–121)
BUN/Creatinine Ratio: 14 (ref 9–20)
BUN: 21 mg/dL (ref 6–24)
Bilirubin Total: 0.8 mg/dL (ref 0.0–1.2)
CO2: 22 mmol/L (ref 20–29)
Calcium: 9.5 mg/dL (ref 8.7–10.2)
Chloride: 104 mmol/L (ref 96–106)
Creatinine, Ser: 1.53 mg/dL — ABNORMAL HIGH (ref 0.76–1.27)
Globulin, Total: 3.5 g/dL (ref 1.5–4.5)
Glucose: 108 mg/dL — ABNORMAL HIGH (ref 70–99)
Potassium: 4.5 mmol/L (ref 3.5–5.2)
Sodium: 142 mmol/L (ref 134–144)
Total Protein: 8 g/dL (ref 6.0–8.5)
eGFR: 58 mL/min/{1.73_m2} — ABNORMAL LOW (ref >59)

## 2022-09-13 LAB — LIPID PANEL
Chol/HDL Ratio: 2.9 ratio (ref 0.0–5.0)
Cholesterol, Total: 121 mg/dL (ref 100–199)
HDL: 42 mg/dL (ref >39)
LDL Chol Calc (NIH): 64 mg/dL (ref 0–99)
Triglycerides: 72 mg/dL (ref 0–149)
VLDL Cholesterol Cal: 15 mg/dL (ref 5–40)

## 2022-09-13 LAB — TSH RFX ON ABNORMAL TO FREE T4: TSH: 1.15 u[IU]/mL (ref 0.450–4.500)

## 2022-09-13 LAB — HEMOGLOBIN A1C
Est. average glucose Bld gHb Est-mCnc: 134 mg/dL
Hgb A1c MFr Bld: 6.3 % — ABNORMAL HIGH (ref 4.8–5.6)

## 2022-10-05 ENCOUNTER — Other Ambulatory Visit: Payer: Self-pay | Admitting: Cardiology

## 2022-10-07 ENCOUNTER — Encounter: Payer: Self-pay | Admitting: Cardiology

## 2022-10-07 MED ORDER — ATORVASTATIN CALCIUM 20 MG PO TABS: 20.0000 mg | ORAL_TABLET | Freq: Every day | ORAL | 3 refills | Status: DC

## 2022-10-07 MED ORDER — AMLODIPINE BESYLATE 10 MG PO TABS: 10.0000 mg | ORAL_TABLET | Freq: Every day | ORAL | 3 refills | Status: DC

## 2022-10-10 ENCOUNTER — Ambulatory Visit (HOSPITAL_BASED_OUTPATIENT_CLINIC_OR_DEPARTMENT_OTHER): Payer: Commercial Managed Care - PPO | Admitting: Family Medicine

## 2022-10-10 VITALS — BP 147/95 | HR 93 | Ht 68.0 in | Wt 283.3 lb

## 2022-10-10 DIAGNOSIS — K219 Gastro-esophageal reflux disease without esophagitis: Secondary | ICD-10-CM | POA: Diagnosis not present

## 2022-10-10 DIAGNOSIS — R051 Acute cough: Secondary | ICD-10-CM

## 2022-10-10 MED ORDER — PROMETHAZINE-DM 6.25-15 MG/5ML PO SYRP
5.0000 mL | ORAL_SOLUTION | Freq: Four times a day (QID) | ORAL | 0 refills | Status: DC | PRN
Start: 2022-10-10 — End: 2023-02-14

## 2022-10-10 MED ORDER — FAMOTIDINE 10 MG PO TABS
10.0000 mg | ORAL_TABLET | Freq: Two times a day (BID) | ORAL | 1 refills | Status: DC
Start: 2022-10-10 — End: 2022-12-13

## 2022-10-10 NOTE — Progress Notes (Signed)
Acute Office Visit  Subjective:     Patient ID: Alejandro Santos, male    DOB: 09-Jan-1981, 42 y.o.   MRN: [id number]  Chief Complaint  Patient presents with   Cough    Ongoing for about a week, mucus producing, yellow mucus. Has been using OTC cough syrup, alka seltzer night & day   Patient reports that he started with a cough on 6/7, along with chest congestion, sinus congestion, and headache. Patient states that he developed a fever and had body aches on 6/8. He reports having a productive initial cough and now he is experiencing a dry cough at this time. He reports he had one episode of emesis. Denies abdominal pain, nausea, weakness, headaches, diarrhea, fever.    Review of Systems  Constitutional:  Negative for chills, fever and malaise/fatigue.  Respiratory:  Negative for cough and shortness of breath.   Cardiovascular:  Negative for chest pain and palpitations.  Gastrointestinal:  Negative for abdominal pain, nausea and vomiting.  Neurological:  Negative for dizziness, weakness and headaches.  Psychiatric/Behavioral:  Negative for depression and suicidal ideas. The patient does not have insomnia.        Objective:    BP (!) 147/95   Pulse 93   Ht 5\' 8"  (1.727 m)   Wt 283 lb 4.8 oz (128.5 kg)   SpO2 100%   BMI 43.08 kg/m   Physical Exam Constitutional:      Appearance: Normal appearance.  Cardiovascular:     Rate and Rhythm: Normal rate and regular rhythm.     Pulses: Normal pulses.     Heart sounds: Normal heart sounds.  Pulmonary:     Effort: Pulmonary effort is normal.     Breath sounds: Normal breath sounds.  Abdominal:     General: Bowel sounds are normal.     Palpations: Abdomen is soft.  Neurological:     Mental Status: He is alert.  Psychiatric:        Mood and Affect: Mood normal.        Behavior: Behavior normal.        Thought Content: Thought content normal.        Judgment: Judgment normal.      Assessment & Plan:   1. Acute cough Patient  presents today for cough that he has had for the past 10 days. Denies current fever/chills, body aches, nausea/vomiting, abdominal pain, shortness of breath. Patient reports he is feeling better than he was last week and thinks he is improving. He has been using OTC Alka Seltzer and Robitussin DM as needed. Patient in no acute distress and is well-appearing. Cardiovascular exam with heart regular rate and rhythm. Normal heart sounds, no murmurs present. No lower extremity edema present. Lungs clear to auscultation bilaterally. No adventitious lung sounds present. Patient would like cough medication to use as needed in the evening. Prescription sent to pharmacy on file.  - promethazine-dextromethorphan (PROMETHAZINE-DM) 6.25-15 MG/5ML syrup; Take 5 mLs by mouth 4 (four) times daily as needed for cough (Maximum dose: 30mL in 24 hours).  Dispense: 180 mL; Refill: 0  2. Gastroesophageal reflux disease, unspecified whether esophagitis present Patient reports experiencing a burning sensation in his epigastric region. He reports occasional relief with over-the-counter Tums. Advised patient that cough can occur with uncontrolled acid reflux. Will trial H2 blocker at this time. Patient agreeable to plan of care. Advised patient to return to office if no improvement of acid reflux with famotidine 10mg  twice daily.   -  famotidine (PEPCID) 10 MG tablet; Take 1 tablet (10 mg total) by mouth 2 (two) times daily.  Dispense: 90 tablet; Refill: 1  Return if symptoms worsen or fail to improve.  Alyson Reedy, FNP

## 2022-12-13 ENCOUNTER — Encounter (HOSPITAL_BASED_OUTPATIENT_CLINIC_OR_DEPARTMENT_OTHER): Payer: Self-pay | Admitting: Family Medicine

## 2022-12-13 ENCOUNTER — Ambulatory Visit (HOSPITAL_BASED_OUTPATIENT_CLINIC_OR_DEPARTMENT_OTHER): Payer: Commercial Managed Care - PPO | Admitting: Family Medicine

## 2022-12-13 VITALS — BP 129/89 | HR 82 | Ht 68.0 in | Wt 283.0 lb

## 2022-12-13 DIAGNOSIS — K219 Gastro-esophageal reflux disease without esophagitis: Secondary | ICD-10-CM

## 2022-12-13 DIAGNOSIS — I442 Atrioventricular block, complete: Secondary | ICD-10-CM | POA: Diagnosis not present

## 2022-12-13 DIAGNOSIS — I10 Essential (primary) hypertension: Secondary | ICD-10-CM | POA: Diagnosis not present

## 2022-12-13 DIAGNOSIS — Z8042 Family history of malignant neoplasm of prostate: Secondary | ICD-10-CM | POA: Diagnosis not present

## 2022-12-13 HISTORY — DX: Gastro-esophageal reflux disease without esophagitis: K21.9

## 2022-12-13 MED ORDER — PANTOPRAZOLE SODIUM 20 MG PO TBEC
20.0000 mg | DELAYED_RELEASE_TABLET | Freq: Every day | ORAL | 2 refills | Status: DC
Start: 2022-12-13 — End: 2023-03-06

## 2022-12-13 NOTE — Assessment & Plan Note (Addendum)
Patient reports that he dad was recently diagnosed with prostate cancer at age 42. Routine HCM labs ordered/Labs reviewed/discussed today. Will obtain labs today and update patient with results. Discussed appropriate age of screening would be at age 18. Counseled patient about the ongoing controversy regarding screening and potential treatment outcomes of prostate cancer. The meaning of a false positive PSA and a false negative PSA has been discussed. He indicates understanding of the limitations of this screening test and wishes to proceed with screening PSA testing at age 77, as recommended.

## 2022-12-13 NOTE — Assessment & Plan Note (Signed)
Patient presents today with initial elevated blood pressure, repeat blood pressure well controlled. Patient in no acute distress and is well-appearing. Denies chest pain, shortness of breath, lower extremity edema, vision changes, headaches. Cardiovascular exam with heart regular rate and rhythm. Distant heart sounds, no murmurs present. No lower extremity edema present. Lungs clear to auscultation bilaterally. Patient is currently taking amlodipine 10mg  daily, hydralazine 10mg  twice daily, & valsartan 160mg  daily. Denies adverse side effects. His blood pressure is well controlled at home. No refills needed. Advised patient to continue to monitor blood pressure.

## 2022-12-13 NOTE — Assessment & Plan Note (Signed)
Patient reports he is still experiencing acid reflux symptoms, despite taking famotidine as prescribed. No red flags. Denies N/V, abdominal pain, dysphagia, odynophagia, unintentional weight loss. Will trial pantoprazole for 2 months and f/u regarding symptoms.

## 2022-12-13 NOTE — Assessment & Plan Note (Addendum)
Patient reports that he will occasionally experience a decrease in his heart rate (noted by his smart watch) to 50 beats-per-minute and become symptomatic. He reports he begins to feel sluggish, weak, and lightheaded at random times. Denies it is associated with activity and does not experience chest pain or shortness of breath. He is currently followed by cardiology but usually has visits every 6-12 months. Will reach out to cardiologist for recommendation regarding decrease in HR and symptoms. EKG in office NSR at 75, with no ST abnormalities or elongated PR interval.

## 2022-12-13 NOTE — Progress Notes (Signed)
Established Patient Office Visit  Subjective   Patient ID: Alejandro Santos, male    DOB: 04-26-1980  Age: 42 y.o. MRN: [id number]  HYPERTENSION: Leston Holthus is a 42 year-old male patient who presents for the medical management of hypertension.  Patient's current hypertension medication regimen is: amlodipine 10mg  daily, hydralazine 10mg  twice daily, & valsartan 160mg  daily  Continues to be followed by cards for intermittent CHB  Patient is currently taking prescribed medications for HTN.  Patient is regularly keeping a check on BP at home. This morning: 120/88, HR 80. Highest reading last week was: 129/75 Adhering to low sodium diet: yes Reports that he notices his HR drops to 50 bpm Feel sluggish, weak, and slightly lightheadedness and dizziness when it happens. Takes about 10-15 mins for HR recover. Notices this occurs randomly, denies with activity but does notice it with an increase in stress.   Dad- prostate cancer, diagnosed at age 42 Patient has concerns about prostate cancer   BP Readings from Last 3 Encounters:  12/13/22 129/89  10/10/22 (!) 147/95  09/12/22 (!) 151/97    Review of Systems  Constitutional:  Negative for malaise/fatigue.  Eyes:  Negative for blurred vision and double vision.  Respiratory:  Negative for cough, shortness of breath and wheezing.   Cardiovascular:  Positive for palpitations (somtimes). Negative for chest pain and leg swelling.  Gastrointestinal:  Negative for abdominal pain, constipation, diarrhea, nausea and vomiting.  Genitourinary:  Negative for frequency and urgency.  Musculoskeletal:  Negative for myalgias.  Neurological:  Negative for dizziness, weakness and headaches.  Psychiatric/Behavioral:  Negative for depression and suicidal ideas. The patient is not nervous/anxious and does not have insomnia.       Objective:    BP 129/89 Comment: Repeat BP  Pulse 82   Ht 5\' 8"  (1.727 m)   Wt 283 lb (128.4 kg)   BMI 43.03 kg/m  BP  Readings from Last 3 Encounters:  12/13/22 129/89  10/10/22 (!) 147/95  09/12/22 (!) 151/97     Physical Exam Constitutional:      Appearance: Normal appearance.  Cardiovascular:     Rate and Rhythm: Normal rate and regular rhythm.     Pulses: Normal pulses.     Heart sounds: Normal heart sounds.  Pulmonary:     Effort: Pulmonary effort is normal.     Breath sounds: Normal breath sounds.  Neurological:     Mental Status: He is alert.  Psychiatric:        Mood and Affect: Mood normal.        Behavior: Behavior normal.        Thought Content: Thought content normal.        Judgment: Judgment normal.     Assessment & Plan:  Essential hypertension Assessment & Plan: Patient presents today with initial elevated blood pressure, repeat blood pressure well controlled. Patient in no acute distress and is well-appearing. Denies chest pain, shortness of breath, lower extremity edema, vision changes, headaches. Cardiovascular exam with heart regular rate and rhythm. Distant heart sounds, no murmurs present. No lower extremity edema present. Lungs clear to auscultation bilaterally. Patient is currently taking amlodipine 10mg  daily, hydralazine 10mg  twice daily, & valsartan 160mg  daily. Denies adverse side effects. His blood pressure is well controlled at home. No refills needed. Advised patient to continue to monitor blood pressure.         Intermittent complete heart block [facility]) Assessment & Plan: Patient reports that he will occasionally experience a decrease in  his heart rate (noted by his smart watch) to 50 beats-per-minute and become symptomatic. He reports he begins to feel sluggish, weak, and lightheaded at random times. Denies it is associated with activity and does not experience chest pain or shortness of breath. He is currently followed by cardiology but usually has visits every 6-12 months. Will reach out to cardiologist for recommendation regarding decrease in HR and symptoms. EKG  in office NSR at 75, with no ST abnormalities or elongated PR interval.   Orders: -     EKG 12-Lead -     EKG 12-Lead  Gastroesophageal reflux disease, unspecified whether esophagitis present Assessment & Plan: Patient reports he is still experiencing acid reflux symptoms, despite taking famotidine as prescribed. No red flags. Denies N/V, abdominal pain, dysphagia, odynophagia, unintentional weight loss. Will trial pantoprazole for 2 months and f/u regarding symptoms.   Orders: -     Pantoprazole Sodium; Take 1 tablet (20 mg total) by mouth daily.  Dispense: 30 tablet; Refill: 2  Family history of prostate cancer in father Assessment & Plan: Patient reports that he dad was recently diagnosed with prostate cancer at age 50. Routine HCM labs ordered/Labs reviewed/discussed today. Will obtain labs today and update patient with results. Discussed appropriate age of screening would be at age 42. Counseled patient about the ongoing controversy regarding screening and potential treatment outcomes of prostate cancer. The meaning of a false positive PSA and a false negative PSA has been discussed. He indicates understanding of the limitations of this screening test and wishes to proceed with screening PSA testing at age 42, as recommended.      Return in about 2 months (around 02/12/2023) for reflux symptoms .    Alyson Reedy, FNP

## 2022-12-14 ENCOUNTER — Other Ambulatory Visit (HOSPITAL_BASED_OUTPATIENT_CLINIC_OR_DEPARTMENT_OTHER): Payer: Self-pay | Admitting: Family Medicine

## 2022-12-14 DIAGNOSIS — K219 Gastro-esophageal reflux disease without esophagitis: Secondary | ICD-10-CM

## 2023-01-04 ENCOUNTER — Other Ambulatory Visit: Payer: Self-pay | Admitting: Cardiology

## 2023-01-04 ENCOUNTER — Other Ambulatory Visit (HOSPITAL_BASED_OUTPATIENT_CLINIC_OR_DEPARTMENT_OTHER): Payer: Self-pay | Admitting: Family Medicine

## 2023-01-04 DIAGNOSIS — K219 Gastro-esophageal reflux disease without esophagitis: Secondary | ICD-10-CM

## 2023-02-14 ENCOUNTER — Ambulatory Visit (HOSPITAL_BASED_OUTPATIENT_CLINIC_OR_DEPARTMENT_OTHER): Payer: Commercial Managed Care - PPO | Admitting: Family Medicine

## 2023-02-14 ENCOUNTER — Ambulatory Visit (INDEPENDENT_AMBULATORY_CARE_PROVIDER_SITE_OTHER): Payer: Commercial Managed Care - PPO

## 2023-02-14 ENCOUNTER — Ambulatory Visit: Payer: Commercial Managed Care - PPO | Attending: Cardiology | Admitting: Cardiology

## 2023-02-14 ENCOUNTER — Encounter: Payer: Self-pay | Admitting: Cardiology

## 2023-02-14 VITALS — BP 140/96 | HR 84 | Ht 68.0 in | Wt 289.0 lb

## 2023-02-14 VITALS — BP 122/86 | HR 97 | Ht 68.0 in | Wt 297.6 lb

## 2023-02-14 DIAGNOSIS — I442 Atrioventricular block, complete: Secondary | ICD-10-CM | POA: Diagnosis not present

## 2023-02-14 DIAGNOSIS — R002 Palpitations: Secondary | ICD-10-CM

## 2023-02-14 DIAGNOSIS — I10 Essential (primary) hypertension: Secondary | ICD-10-CM

## 2023-02-14 DIAGNOSIS — K219 Gastro-esophageal reflux disease without esophagitis: Secondary | ICD-10-CM | POA: Diagnosis not present

## 2023-02-14 DIAGNOSIS — R0981 Nasal congestion: Secondary | ICD-10-CM

## 2023-02-14 DIAGNOSIS — Z79899 Other long term (current) drug therapy: Secondary | ICD-10-CM | POA: Diagnosis not present

## 2023-02-14 LAB — COMPREHENSIVE METABOLIC PANEL
ALT: 39 [IU]/L (ref 0–44)
AST: 52 [IU]/L — ABNORMAL HIGH (ref 0–40)
Albumin: 4.4 g/dL (ref 4.1–5.1)
Alkaline Phosphatase: 81 [IU]/L (ref 44–121)
BUN/Creatinine Ratio: 12 (ref 9–20)
BUN: 15 mg/dL (ref 6–24)
Bilirubin Total: 0.4 mg/dL (ref 0.0–1.2)
CO2: 27 mmol/L (ref 20–29)
Calcium: 9.2 mg/dL (ref 8.7–10.2)
Chloride: 101 mmol/L (ref 96–106)
Creatinine, Ser: 1.28 mg/dL — ABNORMAL HIGH (ref 0.76–1.27)
Globulin, Total: 3.2 g/dL (ref 1.5–4.5)
Glucose: 107 mg/dL — ABNORMAL HIGH (ref 70–99)
Potassium: 4.1 mmol/L (ref 3.5–5.2)
Sodium: 139 mmol/L (ref 134–144)
Total Protein: 7.6 g/dL (ref 6.0–8.5)
eGFR: 72 mL/min/{1.73_m2} (ref >59)

## 2023-02-14 LAB — TSH+T4F+T3FREE
Free T4: 1.47 ng/dL (ref 0.82–1.77)
T3, Free: 3.5 pg/mL (ref 2.0–4.4)
TSH: 1.25 u[IU]/mL (ref 0.450–4.500)

## 2023-02-14 LAB — MAGNESIUM: Magnesium: 2 mg/dL (ref 1.6–2.3)
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
## 2023-02-14 NOTE — Progress Notes (Unsigned)
Established Patient Office Visit  Subjective   Patient ID: Alejandro Santos, male    DOB: 1980-05-03  Age: 42 y.o. MRN: [id number]  Chief Complaint  Patient presents with   Gastroesophageal Reflux    Feels better on the medication   Sinus Problem    Mainly at night, affects using CPAP    When he lays down at night, notices congestion on one side of his nasal cavity   Daily  Home readings around: 110-120s/70-80s HR will go from 60 to 115 in 5 mins  hydralazine 25 mg twice a day, amlodipine 10 mg daily and valsartan 160 mg daily  ROS    Objective:     BP (!) 140/96 (BP Location: Left Arm, Patient Position: Sitting) Comment: Repeat BP  Pulse 84   Ht 5\' 8"  (1.727 m)   Wt 289 lb (131.1 kg)   SpO2 100%   BMI 43.94 kg/m  BP Readings from Last 3 Encounters:  02/14/23 (!) 140/96  12/13/22 129/89  10/10/22 (!) 147/95     Physical Exam    Assessment & Plan:  Gastroesophageal reflux disease, unspecified whether esophagitis present  Nasal sinus congestion     Return in about 3 months (around 05/17/2023) for HTN follow-up.    Alyson Reedy, FNP

## 2023-02-14 NOTE — Patient Instructions (Addendum)
Nasacort- nasal spray for congestion

## 2023-02-14 NOTE — Patient Instructions (Signed)
Medication Instructions:  Your physician recommends that you continue on your current medications as directed. Please refer to the Current Medication list given to you today.  *If you need a refill on your cardiac medications before your next appointment, please call your pharmacy*   Lab Work: CMET, Mag, TSH If you have labs (blood work) drawn today and your tests are completely normal, you will receive your results only by: MyChart Message (if you have MyChart) OR A paper copy in the mail If you have any lab test that is abnormal or we need to change your treatment, we will call you to review the results.   Testing/Procedures: Alejandro Santos- Long Term Monitor Instructions  Your physician has requested you wear a ZIO patch monitor for 14 days.  This is a single patch monitor. [employer] supplies one patch monitor per enrollment. Additional stickers are not available. Please do not apply patch if you will be having a Nuclear Stress Test,  Echocardiogram, Cardiac CT, MRI, or Chest Xray during the period you would be wearing the  monitor. The patch cannot be worn during these tests. You cannot remove and re-apply the  ZIO XT patch monitor.  Your ZIO patch monitor will be mailed 3 day USPS to your address on file. It may take 3-5 days  to receive your monitor after you have been enrolled.  Once you have received your monitor, please review the enclosed instructions. Your monitor  has already been registered assigning a specific monitor serial # to you.  Billing and Patient Assistance Program Information  We have supplied [employer] with any of your insurance information on file for billing purposes. [employer] offers a sliding scale Patient Assistance Program for patients that do not have  insurance, or whose insurance does not completely cover the cost of the ZIO monitor.  You must apply for the Patient Assistance Program to qualify for this discounted rate.  To apply, please call [employer] at  [phone], select option 4, select option 2, ask to apply for  Patient Assistance Program. Meredeth Ide will ask your household income, and how many people  are in your household. They will quote your out-of-pocket cost based on that information.  [employer] will also be able to set up a 68-month, interest-free payment plan if needed.  Applying the monitor   Shave hair from upper left chest.  Hold abrader disc by orange tab. Rub abrader in 40 strokes over the upper left chest as  indicated in your monitor instructions.  Clean area with 4 enclosed alcohol pads. Let dry.  Apply patch as indicated in monitor instructions. Patch will be placed under collarbone on left  side of chest with arrow pointing upward.  Rub patch adhesive wings for 2 minutes. Remove white label marked "1". Remove the white  label marked "2". Rub patch adhesive wings for 2 additional minutes.  While looking in a mirror, press and release button in center of patch. A small green light will  flash 3-4 times. This will be your only indicator that the monitor has been turned on.  Do not shower for the first 24 hours. You may shower after the first 24 hours.  Press the button if you feel a symptom. You will hear a small click. Record Date, Time and  Symptom in the Patient Logbook.  When you are ready to remove the patch, follow instructions on the last 2 pages of Patient  Logbook. Stick patch monitor onto the last page of Patient Logbook.  Place Patient  Logbook in the blue and white box. Use locking tab on box and tape box closed  securely. The blue and white box has prepaid postage on it. Please place it in the mailbox as  soon as possible. Your physician should have your test results approximately 7 days after the  monitor has been mailed back to [facility].  Call [facility] Customer Care at [phone] if you have questions regarding  your ZIO XT patch monitor. Call them immediately if you see an orange light  blinking on your  monitor.  If your monitor falls off in less than 4 days, contact our Monitor department at [phone].  If your monitor becomes loose or falls off after 4 days call [employer] at [phone] for  suggestions on securing your monitor    Follow-Up: At [facility], you and your health needs are our priority.  As part of our continuing mission to provide you with exceptional heart care, we have created designated Provider Care Teams.  These Care Teams include your primary Cardiologist (physician) and Advanced Practice Providers (APPs -  Physician Assistants and Nurse Practitioners) who all work together to provide you with the care you need, when you need it.    Your next appointment:   16 week(s)  Provider:   Thomasene Ripple, DO

## 2023-02-14 NOTE — Progress Notes (Unsigned)
Enrolled patient for a 14 day Zio XT  monitor to be mailed to patients home  °

## 2023-02-14 NOTE — Telephone Encounter (Addendum)
Spoke to patient who report chest pain, fatigue, dizziness, racing heart that comes and goes. He report HR from 135-140 then drops back down to the 50's. He stated his last episode was Tuesday. Patient scheduled for an appointment today with Dr Christen Bame at 11 am.

## 2023-02-16 DIAGNOSIS — R0981 Nasal congestion: Secondary | ICD-10-CM | POA: Insufficient documentation

## 2023-02-16 NOTE — Assessment & Plan Note (Signed)
Patient presents today with slightly elevated blood pressure, recheck without improvement. Patient in no acute distress and is well-appearing. Denies chest pain, shortness of breath, lower extremity edema, vision changes, headaches. Cardiovascular exam with heart regular rate and rhythm. Normal heart sounds, no murmurs present. No lower extremity edema present. Lungs clear to auscultation bilaterally. Patient is currently taking hydralazine 25 mg twice a day, amlodipine 10 mg daily and valsartan 160 mg daily. No refills needed. Will not change BP medication regimen since BP is well controlled at home. Advised patient to schedule follow-up with cardiology.

## 2023-02-16 NOTE — Assessment & Plan Note (Signed)
Patient reports feeling heart palpitations occasionally and notices his heart rate will increase from 60bpm to 115bpm. Advised patient to schedule follow-up with cardiology since he has a history of intermittent CHB.

## 2023-02-16 NOTE — Assessment & Plan Note (Signed)
Patient reports improved GERD symptoms on pantoprazole 20mg  daily. Patient would like to continue medication treatment. Discussed lifestyle modifications- including weight loss and avoiding fried & spicy foods. If reflux symptoms continue, would be reasonable to refer to GI for endoscopy.

## 2023-02-16 NOTE — Assessment & Plan Note (Signed)
Patient presents today with sinus congestion at night when he lays down. He reports being compliant with his CPAP every night. Denies sinus pain/pressure, headache, ear fullness, ear discharge. Discussed nasal rinses but patient reports he would not likely tolerate this. Counseled patient about possible benefits of steroid nasal spray to help relieve nasal congestion. Discussed short-term use of Afrin. Advised patient to ensure CPAP equipment is cleaned frequently. Advised him to follow up if he continues to notice nasal congestion.

## 2023-02-19 DIAGNOSIS — R002 Palpitations: Secondary | ICD-10-CM | POA: Diagnosis not present

## 2023-03-05 ENCOUNTER — Encounter (HOSPITAL_BASED_OUTPATIENT_CLINIC_OR_DEPARTMENT_OTHER): Payer: Self-pay | Admitting: Family Medicine

## 2023-03-06 ENCOUNTER — Other Ambulatory Visit (HOSPITAL_BASED_OUTPATIENT_CLINIC_OR_DEPARTMENT_OTHER): Payer: Self-pay | Admitting: *Deleted

## 2023-03-06 DIAGNOSIS — K219 Gastro-esophageal reflux disease without esophagitis: Secondary | ICD-10-CM

## 2023-03-06 MED ORDER — PANTOPRAZOLE SODIUM 20 MG PO TBEC: 20.0000 mg | DELAYED_RELEASE_TABLET | Freq: Every day | ORAL | 2 refills | Status: DC

## 2023-03-13 ENCOUNTER — Encounter (HOSPITAL_BASED_OUTPATIENT_CLINIC_OR_DEPARTMENT_OTHER): Payer: Self-pay | Admitting: Family Medicine

## 2023-03-17 ENCOUNTER — Telehealth: Payer: Self-pay | Admitting: Cardiology

## 2023-03-17 NOTE — Telephone Encounter (Signed)
Received call from [facility] with [employer] calling to report monitor revealed 1 episode of ventricular asystole lasting 6.8 sec. found on page 32 strip 5 .Monitor results posted in chart.Spoke to patient he stated he had 2 episodes while wearing monitor he felt tired and sob.Advised Dr.Tobb is out of office.I will send message to her.

## 2023-03-17 NOTE — Telephone Encounter (Signed)
Spoke to patient Dr.Tobb's advice given.Appointment scheduled with Dr.Camnitz 12/3 at 1:45 pm.

## 2023-03-17 NOTE — Telephone Encounter (Signed)
Olivia with [employer] is calling to report abnormal Zio monitor results.

## 2023-03-25 ENCOUNTER — Ambulatory Visit: Payer: Commercial Managed Care - PPO | Attending: Cardiology | Admitting: Cardiology

## 2023-03-25 ENCOUNTER — Encounter: Payer: Self-pay | Admitting: Cardiology

## 2023-03-25 VITALS — BP 140/90 | HR 104 | Ht 68.0 in | Wt 295.4 lb

## 2023-03-25 DIAGNOSIS — R002 Palpitations: Secondary | ICD-10-CM

## 2023-03-25 DIAGNOSIS — I10 Essential (primary) hypertension: Secondary | ICD-10-CM

## 2023-03-25 MED ORDER — DILTIAZEM HCL ER COATED BEADS 120 MG PO CP24: 120.0000 mg | ORAL_CAPSULE | Freq: Every day | ORAL | 6 refills | Status: DC

## 2023-03-25 NOTE — Progress Notes (Signed)
  Electrophysiology Office Note:   Date:  03/25/2023  ID:  Alejandro Santos, DOB [birth year], MRN [id number]  Primary Cardiologist: Thomasene Ripple, DO Electrophysiologist: None      History of Present Illness:   Alejandro Santos is a 42 y.o. male with h/o palpitations, hypertension, obesity, sleep apnea seen today for routine electrophysiology followup.   Since last being seen in our clinic the patient reports continued palpitations, dizziness, lightheadedness.  He usually feels well.  He does have episodes once a week where he feels lightheaded and dizzy.  He states that his heart rates are in the 120s to 140s and then quickly go down into the 60s.  He has potentially some chest discomfort associated with these episodes.  He has not had any syncope.  He did have pauses on a recent cardiac monitor, but all pauses were nocturnal.  he denies chest pain, palpitations, dyspnea, PND, orthopnea, nausea, vomiting, dizziness, syncope, edema, weight gain, or early satiety.   Review of systems complete and found to be negative unless listed in HPI.   EP Information / Studies Reviewed:    EKG is not ordered today. EKG from 02/14/23 reviewed which showed sinus rhythm        Risk Assessment/Calculations:             Physical Exam:   VS:  BP (!) 140/90 (BP Location: Left Arm, Patient Position: Sitting, Cuff Size: Large)   Pulse (!) 104   Ht 5\' 8"  (1.727 m)   Wt 295 lb 6.4 oz (134 kg)   SpO2 99%   BMI 44.92 kg/m    Wt Readings from Last 3 Encounters:  03/25/23 295 lb 6.4 oz (134 kg)  02/14/23 297 lb 9.6 oz (135 kg)  02/14/23 289 lb (131.1 kg)     GEN: Well nourished, well developed in no acute distress NECK: No JVD; No carotid bruits CARDIAC: Regular rate and rhythm, no murmurs, rubs, gallops RESPIRATORY:  Clear to auscultation without rales, wheezing or rhonchi  ABDOMEN: Soft, non-tender, non-distended EXTREMITIES:  No edema; No deformity   ASSESSMENT AND PLAN:    1.  Palpitations: Has  had sinus tachycardia.  He feels quite symptomatic with rapid rise and fall and his heart rate.  Eloise Picone start diltiazem 120 mg daily.  If he has any further episodes of dizziness associated with bradycardia, would likely need to stop diltiazem.  If he is stable at follow-up, can follow-up with general cardiology.  2.  Intermittent heart block: Had bradycardia while in the hospital.  Carvedilol was held.  Hypertension: Blood pressure elevated.  Plan per primary cardiology.  Case discussed with primary cardiology  Follow up with EP APP in 3 months  Signed, Brandee Markin Jorja Loa, MD

## 2023-03-25 NOTE — Patient Instructions (Addendum)
Medication Instructions:  Your physician has recommended you make the following change in your medication: START Diltiazem 120 mg once daily  *If you need a refill on your cardiac medications before your next appointment, please call your pharmacy*   Lab Work: None ordered   Testing/Procedures: None ordered   Follow-Up: At [facility], you and your health needs are our priority.  As part of our continuing mission to provide you with exceptional heart care, we have created designated Provider Care Teams.  These Care Teams include your primary Cardiologist (physician) and Advanced Practice Providers (APPs -  Physician Assistants and Nurse Practitioners) who all work together to provide you with the care you need, when you need it.  Your next appointment:   6 month(s)  The format for your next appointment:   In Person  Provider:   You will see one of the following Advanced Practice Providers on your designated Care Team:   Francis Dowse, [place] "Mardelle Matte" [place], [place] Canary Brim, NP      Thank you for choosing [facility]!!   Dory Horn, RN [phone]  Other Instructions  Diltiazem Tablets What is this medication? DILTIAZEM (dil TYE a zem) treats high blood pressure and prevents chest pain (angina). It works by relaxing the blood vessels, which helps decrease the amount of work your heart has to do. It belongs to a group of medications called calcium channel blockers. This medicine may be used for other purposes; ask your health care provider or pharmacist if you have questions. COMMON BRAND NAME(S): Cardizem What should I tell my care team before I take this medication? They need to know if you have any of these conditions: Heart attack Heart disease Irregular heartbeat or rhythm Low blood pressure An unusual or allergic reaction to diltiazem, other medications, foods, dyes, or preservatives Pregnant or trying to get pregnant Breast-feeding How  should I use this medication? Take this medication by mouth. Take it as directed on the prescription label at the same time every day. Keep taking it unless your care team tells you to stop. Talk to your care team about the use of this medication in children. Special care may be needed. Overdosage: If you think you have taken too much of this medicine contact a poison control center or emergency room at once. NOTE: This medicine is only for you. Do not share this medicine with others. What if I miss a dose? If you miss a dose, take it as soon as you can. If it is almost time for your next dose, take only that dose. Do not take double or extra doses. What may interact with this medication? Do not take this medication with any of the following: Cisapride [place] Pimozide Ranolazine Red yeast rice This medication may also interact with the following: Buspirone Carbamazepine Cimetidine Cyclosporine Digoxin Local anesthetics or general anesthetics Lovastatin Medications for anxiety or difficulty sleeping like midazolam and triazolam Medications for high blood pressure or heart problems Quinidine Rifampin, rifabutin, or rifapentine This list may not describe all possible interactions. Give your health care provider a list of all the medicines, herbs, non-prescription drugs, or dietary supplements you use. Also tell them if you smoke, drink alcohol, or use illegal drugs. Some items may interact with your medicine. What should I watch for while using this medication? Visit your care team for regular checks on your progress. Check your blood pressure as directed. Know what your blood pressure should be and when to contact your care team.  Do not treat yourself for coughs, colds, or pain while you are using this medication without asking your care team for advice. Some medications may increase your blood pressure. This medication may cause serious skin reactions. They can happen weeks to months  after starting the medication. Contact your care team right away if you notice fevers or flu-like symptoms with a rash. The rash may be red or purple and then turn into blisters or peeling of the skin. You may also notice a red rash with swelling of the face, lips, or lymph nodes in your neck or under your arms. This medication may affect your coordination, reaction time, or judgment. Do not drive or operate machinery until you know how this medication affects you. Sit up or stand slowly to reduce the risk of dizzy or fainting spells. Drinking alcohol with this medication can increase the risk of these side effects. What side effects may I notice from receiving this medication? Side effects that you should report to your care team as soon as possible: Allergic reactions--skin rash, itching, hives, swelling of the face, lips, tongue, or throat Heart failure--shortness of breath, swelling of the ankles, feet, or hands, sudden weight gain, unusual weakness or fatigue Slow heart beat--dizziness, feeling faint or lightheaded, trouble breathing, unusual weakness or fatigue Liver injury--right upper belly pain, loss of appetite, nausea, light-colored stool, dark yellow or brown urine, yellowing skin or eyes, unusual weakness or fatigue Low blood pressure--dizziness, feeling faint or lightheaded, blurry vision Redness, blistering, peeling, or loosening of the skin, including inside the mouth Side effects that usually do not require medical attention (report to your care team if they continue or are bothersome): Constipation Facial flushing, redness Headache This list may not describe all possible side effects. Call your doctor for medical advice about side effects. You may report side effects to FDA at 1-800-FDA-1088. Where should I keep my medication? Keep out of the reach of children and pets. Store at room temperature between 15 and 30 degrees C (59 and 86 degrees F). Protect from moisture. Keep the  container tightly closed. Throw away any unused medication after the expiration date. NOTE: This sheet is a summary. It may not cover all possible information. If you have questions about this medicine, talk to your doctor, pharmacist, or health care provider.  2024 Elsevier/Gold Standard (2022-01-03 00:00:00)

## 2023-05-16 ENCOUNTER — Ambulatory Visit (HOSPITAL_BASED_OUTPATIENT_CLINIC_OR_DEPARTMENT_OTHER): Payer: Commercial Managed Care - PPO | Admitting: Family Medicine

## 2023-05-16 VITALS — BP 128/86 | HR 84 | Temp 98.2°F | Ht 68.0 in | Wt 289.4 lb

## 2023-05-16 DIAGNOSIS — R7303 Prediabetes: Secondary | ICD-10-CM | POA: Diagnosis not present

## 2023-05-16 DIAGNOSIS — Z1159 Encounter for screening for other viral diseases: Secondary | ICD-10-CM

## 2023-05-16 DIAGNOSIS — E782 Mixed hyperlipidemia: Secondary | ICD-10-CM

## 2023-05-16 DIAGNOSIS — I10 Essential (primary) hypertension: Secondary | ICD-10-CM
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
## 2023-05-16 NOTE — Patient Instructions (Addendum)
Xyzal- nighttime for post-nasal drip  Flonase or Nasacort- safe to take daily

## 2023-05-17 LAB — BASIC METABOLIC PANEL
BUN/Creatinine Ratio: 16 (ref 9–20)
BUN: 21 mg/dL (ref 6–24)
CO2: 21 mmol/L (ref 20–29)
Calcium: 9.3 mg/dL (ref 8.7–10.2)
Chloride: 104 mmol/L (ref 96–106)
Creatinine, Ser: 1.35 mg/dL — ABNORMAL HIGH (ref 0.76–1.27)
Glucose: 105 mg/dL — ABNORMAL HIGH (ref 70–99)
Potassium: 3.9 mmol/L (ref 3.5–5.2)
Sodium: 143 mmol/L (ref 134–144)
eGFR: 67 mL/min/{1.73_m2} (ref >59)

## 2023-05-17 LAB — LIPID PANEL
Chol/HDL Ratio: 3.2 {ratio} (ref 0.0–5.0)
Cholesterol, Total: 138 mg/dL (ref 100–199)
HDL: 43 mg/dL (ref >39)
LDL Chol Calc (NIH): 82 mg/dL (ref 0–99)
Triglycerides: 60 mg/dL (ref 0–149)
VLDL Cholesterol Cal: 13 mg/dL (ref 5–40)

## 2023-05-17 LAB — HEPATITIS C ANTIBODY: Hep C Virus Ab: NONREACTIVE

## 2023-05-17 LAB — HEMOGLOBIN A1C
Est. average glucose Bld gHb Est-mCnc: 137 mg/dL
Hgb A1c MFr Bld: 6.4 % — ABNORMAL HIGH (ref 4.8–5.6)

## 2023-05-18 ENCOUNTER — Encounter (HOSPITAL_BASED_OUTPATIENT_CLINIC_OR_DEPARTMENT_OTHER): Payer: Self-pay | Admitting: Family Medicine

## 2023-05-18 NOTE — Progress Notes (Signed)
Hi Alejandro Santos,  Your kidney function is stable and electrolytes were normal. Your A1c has slightly increased to 6.4. If it increases over 6.4 we will consider you to have Type 2 Diabetes and no longer prediabetes. We could consider starting medication such as Metformin if you would want to try this to help lower your glucose. Diet and Exercise , monitoring sugar, carbohydrates is the best way to overall improve your health and reduce your risk of diabetes progression. We will plan to recheck this at the next appt. Please let me know if you would like to start medication.

## 2023-05-28 NOTE — Progress Notes (Signed)
 Dr. Sheena and Jasmine,  Nayshawn has been identified as a patient that may benefit from health coaching to improve healthy eating habits and increasing physical activity. Discuss with patient their interest in participating and refer to REF 2201 or search care navigation.    Greig Ruth, MS, ERHD, [facility] Care Guide, Health & Wellness Coach [address], [address] [place] [address] Telephone: [phone] Email: [email or web address]

## 2023-05-31 ENCOUNTER — Other Ambulatory Visit (HOSPITAL_BASED_OUTPATIENT_CLINIC_OR_DEPARTMENT_OTHER): Payer: Self-pay | Admitting: Family Medicine

## 2023-05-31 DIAGNOSIS — K219 Gastro-esophageal reflux disease without esophagitis: Secondary | ICD-10-CM

## 2023-06-13 ENCOUNTER — Ambulatory Visit: Payer: Commercial Managed Care - PPO | Admitting: Cardiology

## 2023-06-18 ENCOUNTER — Encounter: Payer: Self-pay | Admitting: Cardiology

## 2023-06-18 ENCOUNTER — Ambulatory Visit: Payer: Commercial Managed Care - PPO | Attending: Cardiology | Admitting: Cardiology

## 2023-06-18 VITALS — BP 130/88 | HR 93 | Ht 68.0 in | Wt 294.4 lb

## 2023-06-18 DIAGNOSIS — I442 Atrioventricular block, complete: Secondary | ICD-10-CM

## 2023-06-18 DIAGNOSIS — G4733 Obstructive sleep apnea (adult) (pediatric): Secondary | ICD-10-CM

## 2023-06-18 DIAGNOSIS — I10 Essential (primary) hypertension: Secondary | ICD-10-CM | POA: Diagnosis not present
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
## 2023-06-18 NOTE — Patient Instructions (Addendum)
 Medication Instructions:  Your physician recommends that you continue on your current medications as directed. Please refer to the Current Medication list given to you today.  *If you need a refill on your cardiac medications before your next appointment, please call your pharmacy*  Follow-Up: At [facility], you and your health needs are our priority.  As part of our continuing mission to provide you with exceptional heart care, we have created designated Provider Care Teams.  These Care Teams include your primary Cardiologist (physician) and Advanced Practice Providers (APPs -  Physician Assistants and Nurse Practitioners) who all work together to provide you with the care you need, when you need it.   Your next appointment:   1 year  Provider:   Thomasene Ripple, DO

## 2023-06-29 ENCOUNTER — Other Ambulatory Visit: Payer: Self-pay | Admitting: Cardiology

## 2023-06-30 NOTE — Telephone Encounter (Signed)
 Rx refill sent to pharmacy.

## 2023-07-23 ENCOUNTER — Other Ambulatory Visit (HOSPITAL_BASED_OUTPATIENT_CLINIC_OR_DEPARTMENT_OTHER): Payer: Self-pay

## 2023-09-26 ENCOUNTER — Other Ambulatory Visit: Payer: Self-pay | Admitting: Cardiology

## 2023-10-03 ENCOUNTER — Encounter (HOSPITAL_BASED_OUTPATIENT_CLINIC_OR_DEPARTMENT_OTHER): Payer: Self-pay | Admitting: Family Medicine

## 2023-10-03 MED ORDER — AMLODIPINE BESYLATE 10 MG PO TABS: 10.0000 mg | ORAL_TABLET | Freq: Every day | ORAL | 3 refills | Status: DC

## 2023-10-03 MED ORDER — ATORVASTATIN CALCIUM 20 MG PO TABS: 20.0000 mg | ORAL_TABLET | Freq: Every day | ORAL | 3 refills | Status: DC

## 2023-10-09 NOTE — Progress Notes (Signed)
 Cardiology Office Note:  .   Date:  10/09/2023  ID:  Alejandro Santos, DOB 31-May-1980, MRN [id number] PCP: Knute Thersia Bitters, FNP  [facility] Cardiologist:  Kardie Tobb, DO {  History of Present Illness: Alejandro   Vi Santos is a 43 y.o. male w/PMHx of  HTN, obesity, sleep apnea  He saw Dr. Inocencio 03/25/23, c/o palpitations, discussed monitor that noted some pauses, though were nocturnal, dx w/sinus tachycardia Reports of Intermittent heart block: Had bradycardia while in the hospital. Carvedilol  was held.  Plan: If he has any further episodes of dizziness associated with bradycardia, would likely need to stop diltiazem . If he is stable at follow-up, can follow-up with general cardiology   Planned for EP APP in 3 mo  He saw Dr. Sheena 06/18/23 Reported at this visit he never stopped the coreg  (?) Doesnot sound like he had any c/o, symptoms Coreg  > stopped Dilt continued    Today's visit is scheduled as a 6 mo visit ROS:   He feels quite well confirms that he has been off the coreg  since PRIOR to his monitor in Nov 2024. 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 Sheena and EP team   Studies Reviewed: Alejandro    EKG not done today   Nov 2024: monitor Conclusion: The study showed the following:                       1. Rare Supraventricular tachycardia                       2. Sinus pauses - 12 episodes                       3. High grade AV block Recommendation: will have the patient see EF again. This was monitor was worn while off AV nodal blockers.  03/19/22: stress echo  1. Negative, adequate stress test.   2. This is a negative stress echocardiogram for ischemia.   3. This is a low risk study.  2D  Echo Findings: The baseline ejection fraction was 65%. The peak  ejection fraction at stress was 80%. Baseline regional wall motion  abnormalities were not present. There were no stress-induced wall motion  abnormalities. This is a negative stress  echocardiogram for ischemia.   Risk Assessment/Calculations:    Physical Exam:   VS:  There were no vitals taken for this visit.   Wt Readings from Last 3 Encounters:  06/18/23 294 lb 6.4 oz (133.5 kg)  05/16/23 289 lb 6.4 oz (131.3 kg)  03/25/23 295 lb 6.4 oz (134 kg)    GEN: Well nourished, well developed in no acute distress NECK: No JVD; No carotid bruits CARDIAC: RRR, no murmurs, rubs, gallops RESPIRATORY:  CTA b/l without rales, wheezing or rhonchi  ABDOMEN: Soft, non-tender, non-distended EXTREMITIES: No edema; No deformity   ASSESSMENT AND PLAN: .    Sinus tachycardia Intermittent CHB on two nodal blocking agents Nocturnal pauses Off coreg  with continued marked improvement with no symptoms  He likes to check in about Q64mo and would like to keep both Dr. Sheena and EP Will have him see her in Dec  and us  in a year Sooner if needed     Dispo: as above  Signed, Charlies Macario Arthur, PA-C

## 2023-10-10 ENCOUNTER — Ambulatory Visit: Attending: Physician Assistant | Admitting: Physician Assistant

## 2023-10-10 VITALS — BP 124/72 | HR 93 | Ht 68.0 in | Wt 300.0 lb

## 2023-10-10 DIAGNOSIS — R000 Tachycardia, unspecified: Secondary | ICD-10-CM

## 2023-10-10 DIAGNOSIS — I442 Atrioventricular block, complete: Secondary | ICD-10-CM | POA: Diagnosis not present

## 2023-10-10 NOTE — Patient Instructions (Addendum)
 Medication Instructions:   Your physician recommends that you continue on your current medications as directed. Please refer to the Current Medication list given to you today.   *If you need a refill on your cardiac medications before your next appointment, please call your pharmacy*    Lab Work: NONE ORDERED  TODAY   If you have labs (blood work) drawn today and your tests are completely normal, you will receive your results only by: MyChart Message (if you have MyChart) OR A paper copy in the mail If you have any lab test that is abnormal or we need to change your treatment, we will call you to review the results.   Testing/Procedures: NONE ORDERED  TODAY     Follow-Up: At [facility], you and your health needs are our priority.  As part of our continuing mission to provide you with exceptional heart care, our providers are all part of one team.  This team includes your primary Cardiologist (physician) and Advanced Practice Providers or APPs (Physician Assistants and Nurse Practitioners) who all work together to provide you with the care you need, when you need it.  Your next appointment:    1 year(s)   Provider:    You may see Dr. Lawana Pray  or one of the following Advanced Practice Providers on your designated Care Team:   Mertha Abrahams, [place]    We recommend signing up for the patient portal called MyChart.  Sign up information is provided on this After Visit Summary.  MyChart is used to connect with patients for Virtual Visits (Telemedicine).  Patients are able to view lab/test results, encounter notes, upcoming appointments, etc.  Non-urgent messages can be sent to your provider as well.   To learn more about what you can do with MyChart, go to [email or web address].   Other Instructions

## 2023-10-21 ENCOUNTER — Other Ambulatory Visit: Payer: Self-pay | Admitting: Cardiology

## 2023-11-13 ENCOUNTER — Encounter (HOSPITAL_BASED_OUTPATIENT_CLINIC_OR_DEPARTMENT_OTHER): Payer: Self-pay | Admitting: Family Medicine

## 2023-11-13 ENCOUNTER — Ambulatory Visit (INDEPENDENT_AMBULATORY_CARE_PROVIDER_SITE_OTHER): Payer: Commercial Managed Care - PPO | Admitting: Family Medicine

## 2023-11-13 ENCOUNTER — Other Ambulatory Visit (HOSPITAL_COMMUNITY)
Admission: RE | Admit: 2023-11-13 | Discharge: 2023-11-13 | Disposition: A | Discharge status: Home / Self Care | Source: Ambulatory Visit | Attending: Family Medicine | Admitting: Family Medicine

## 2023-11-13 VITALS — BP 132/80 | HR 97 | Ht 68.0 in | Wt 310.7 lb

## 2023-11-13 DIAGNOSIS — Z202 Contact with and (suspected) exposure to infections with a predominantly sexual mode of transmission: Secondary | ICD-10-CM | POA: Insufficient documentation

## 2023-11-13 DIAGNOSIS — Z23 Encounter for immunization: Secondary | ICD-10-CM | POA: Diagnosis not present

## 2023-11-13 DIAGNOSIS — R7303 Prediabetes: Secondary | ICD-10-CM | POA: Diagnosis not present

## 2023-11-13 DIAGNOSIS — Z1159 Encounter for screening for other viral diseases: Secondary | ICD-10-CM

## 2023-11-13 DIAGNOSIS — Z0000 Encounter for general adult medical examination without abnormal findings: Secondary | ICD-10-CM | POA: Diagnosis not present

## 2023-11-13 DIAGNOSIS — I10 Essential (primary) hypertension: Secondary | ICD-10-CM

## 2023-11-13 DIAGNOSIS — Z114 Encounter for screening for human immunodeficiency virus [HIV]: Secondary | ICD-10-CM
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
## 2023-11-13 NOTE — Patient Instructions (Signed)

## 2023-11-14 LAB — URINE CYTOLOGY ANCILLARY ONLY
Chlamydia: NEGATIVE
Comment: NEGATIVE
Comment: NEGATIVE
Comment: NORMAL
Neisseria Gonorrhea: NEGATIVE
Trichomonas: NEGATIVE

## 2023-11-14 LAB — CBC WITH DIFFERENTIAL/PLATELET
Basophils Absolute: 0 x10E3/uL (ref 0.0–0.2)
Basos: 1 %
EOS (ABSOLUTE): 0.1 x10E3/uL (ref 0.0–0.4)
Eos: 2 %
Hematocrit: 45.4 % (ref 37.5–51.0)
Hemoglobin: 14.8 g/dL (ref 13.0–17.7)
Immature Grans (Abs): 0 x10E3/uL (ref 0.0–0.1)
Immature Granulocytes: 0 %
Lymphocytes Absolute: 2.7 x10E3/uL (ref 0.7–3.1)
Lymphs: 35 %
MCH: 28.4 pg (ref 26.6–33.0)
MCHC: 32.6 g/dL (ref 31.5–35.7)
MCV: 87 fL (ref 79–97)
Monocytes Absolute: 0.7 x10E3/uL (ref 0.1–0.9)
Monocytes: 9 %
Neutrophils Absolute: 4.2 x10E3/uL (ref 1.4–7.0)
Neutrophils: 53 %
Platelets: 442 x10E3/uL (ref 150–450)
RBC: 5.21 x10E6/uL (ref 4.14–5.80)
RDW: 13.8 % (ref 11.6–15.4)
WBC: 7.8 x10E3/uL (ref 3.4–10.8)

## 2023-11-14 LAB — COMPREHENSIVE METABOLIC PANEL WITH GFR
ALT: 34 IU/L (ref 0–44)
AST: 23 IU/L (ref 0–40)
Albumin: 4.6 g/dL (ref 4.1–5.1)
Alkaline Phosphatase: 80 IU/L (ref 44–121)
BUN/Creatinine Ratio: 12 (ref 9–20)
BUN: 17 mg/dL (ref 6–24)
Bilirubin Total: 0.4 mg/dL (ref 0.0–1.2)
CO2: 22 mmol/L (ref 20–29)
Calcium: 9.4 mg/dL (ref 8.7–10.2)
Chloride: 101 mmol/L (ref 96–106)
Creatinine, Ser: 1.39 mg/dL — ABNORMAL HIGH (ref 0.76–1.27)
Globulin, Total: 3.4 g/dL (ref 1.5–4.5)
Glucose: 111 mg/dL — ABNORMAL HIGH (ref 70–99)
Potassium: 4.2 mmol/L (ref 3.5–5.2)
Sodium: 141 mmol/L (ref 134–144)
Total Protein: 8 g/dL (ref 6.0–8.5)
eGFR: 65 mL/min/1.73 (ref >59)

## 2023-11-14 LAB — HEMOGLOBIN A1C
Est. average glucose Bld gHb Est-mCnc: 140 mg/dL
Hgb A1c MFr Bld: 6.5 % — ABNORMAL HIGH (ref 4.8–5.6)

## 2023-11-14 LAB — HIV ANTIBODY (ROUTINE TESTING W REFLEX): HIV Screen 4th Generation wRfx: NONREACTIVE

## 2023-11-14 LAB — HEPATITIS C ANTIBODY: Hep C Virus Ab: NONREACTIVE

## 2023-11-14 LAB — RPR: RPR Ser Ql: NONREACTIVE

## 2023-11-17 ENCOUNTER — Encounter (HOSPITAL_BASED_OUTPATIENT_CLINIC_OR_DEPARTMENT_OTHER): Payer: Self-pay | Admitting: Family Medicine

## 2023-11-17 ENCOUNTER — Ambulatory Visit (HOSPITAL_BASED_OUTPATIENT_CLINIC_OR_DEPARTMENT_OTHER): Payer: Self-pay | Admitting: Family Medicine

## 2023-11-17 DIAGNOSIS — N182 Chronic kidney disease, stage 2 (mild): Secondary | ICD-10-CM | POA: Insufficient documentation

## 2023-11-17 DIAGNOSIS — E1122 Type 2 diabetes mellitus with diabetic chronic kidney disease: Secondary | ICD-10-CM | POA: Insufficient documentation

## 2023-11-17 NOTE — Progress Notes (Signed)
 Hi Alejandro Santos,  Your kidney function is stable, please continue clear hydration, limit animal protein intake and avoid NSAIDs (such as Ibuprofen - Advil , or Aleve- Naproxen) to support chronic kidney disease function. Your A1C is now in the Type 2 diabetes range. I would recommend starting medication such as Ozempic or Mounjaro to help with progression of diabetes and your kidney function. This is a once weekly injectable medication. If you are interested in this, please let me know. Your STD testing was negative.

## 2023-12-25 ENCOUNTER — Other Ambulatory Visit: Payer: Self-pay | Admitting: Cardiology

## 2024-01-05 ENCOUNTER — Encounter (HOSPITAL_BASED_OUTPATIENT_CLINIC_OR_DEPARTMENT_OTHER): Payer: Self-pay | Admitting: Family Medicine

## 2024-01-06 MED ORDER — ATORVASTATIN CALCIUM 20 MG PO TABS: 20.0000 mg | ORAL_TABLET | Freq: Every day | ORAL | 3 refills | Status: AC

## 2024-01-06 MED ORDER — AMLODIPINE BESYLATE 10 MG PO TABS: 10.0000 mg | ORAL_TABLET | Freq: Every day | ORAL | 3 refills | Status: AC

## 2024-01-29 ENCOUNTER — Ambulatory Visit (INDEPENDENT_AMBULATORY_CARE_PROVIDER_SITE_OTHER)

## 2024-01-29 ENCOUNTER — Encounter (HOSPITAL_BASED_OUTPATIENT_CLINIC_OR_DEPARTMENT_OTHER): Payer: Self-pay | Admitting: Family Medicine

## 2024-01-29 ENCOUNTER — Other Ambulatory Visit (HOSPITAL_COMMUNITY)
Admission: RE | Admit: 2024-01-29 | Discharge: 2024-01-29 | Disposition: A | Discharge status: Home / Self Care | Source: Ambulatory Visit | Attending: Family Medicine | Admitting: Family Medicine

## 2024-01-29 ENCOUNTER — Ambulatory Visit (HOSPITAL_BASED_OUTPATIENT_CLINIC_OR_DEPARTMENT_OTHER): Admitting: Family Medicine

## 2024-01-29 VITALS — BP 134/88 | HR 89 | Ht 68.0 in | Wt 311.0 lb

## 2024-01-29 DIAGNOSIS — Z1159 Encounter for screening for other viral diseases: Secondary | ICD-10-CM

## 2024-01-29 DIAGNOSIS — E1122 Type 2 diabetes mellitus with diabetic chronic kidney disease: Secondary | ICD-10-CM | POA: Diagnosis not present

## 2024-01-29 DIAGNOSIS — J3089 Other allergic rhinitis: Secondary | ICD-10-CM | POA: Diagnosis not present

## 2024-01-29 DIAGNOSIS — M25561 Pain in right knee: Secondary | ICD-10-CM | POA: Diagnosis not present

## 2024-01-29 DIAGNOSIS — J309 Allergic rhinitis, unspecified: Secondary | ICD-10-CM | POA: Insufficient documentation

## 2024-01-29 DIAGNOSIS — Z114 Encounter for screening for human immunodeficiency virus [HIV]: Secondary | ICD-10-CM

## 2024-01-29 DIAGNOSIS — Z202 Contact with and (suspected) exposure to infections with a predominantly sexual mode of transmission: Secondary | ICD-10-CM | POA: Insufficient documentation

## 2024-01-29 DIAGNOSIS — N182 Chronic kidney disease, stage 2 (mild): Secondary | ICD-10-CM | POA: Diagnosis not present

## 2024-01-29 MED ORDER — FLUTICASONE PROPIONATE 50 MCG/ACT NA SUSP: 2.0000 | Freq: Every day | NASAL | 5 refills | Status: AC
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
## 2024-01-29 NOTE — Patient Instructions (Signed)
-   Please go to 2nd floor for x ray

## 2024-01-30 ENCOUNTER — Ambulatory Visit (HOSPITAL_BASED_OUTPATIENT_CLINIC_OR_DEPARTMENT_OTHER): Payer: Self-pay | Admitting: Family Medicine

## 2024-01-30 LAB — HEPATITIS C ANTIBODY: Hep C Virus Ab: NONREACTIVE

## 2024-01-30 LAB — RPR: RPR Ser Ql: NONREACTIVE

## 2024-01-30 LAB — URINE CYTOLOGY ANCILLARY ONLY
Chlamydia: NEGATIVE
Comment: NEGATIVE
Comment: NEGATIVE
Comment: NORMAL
Neisseria Gonorrhea: NEGATIVE
Trichomonas: NEGATIVE

## 2024-01-30 LAB — HIV ANTIBODY (ROUTINE TESTING W REFLEX): HIV Screen 4th Generation wRfx: NONREACTIVE

## 2024-01-30 NOTE — Progress Notes (Signed)
 STI testing negative with blood work.

## 2024-02-04 ENCOUNTER — Other Ambulatory Visit (HOSPITAL_BASED_OUTPATIENT_CLINIC_OR_DEPARTMENT_OTHER): Payer: Self-pay | Admitting: Family Medicine

## 2024-02-04 DIAGNOSIS — M25561 Pain in right knee: Secondary | ICD-10-CM

## 2024-02-04 NOTE — Telephone Encounter (Signed)
 Please see mychart sent by pt and advise.

## 2024-02-04 NOTE — Progress Notes (Signed)
 Hi Alejandro Santos, The joint spaces of your right knee are well-preserved.  No evidence of any bony abnormalities or fractures.  This may be as we thought related to a ligament strain or injury.  I would recommend following up with orthopedics at this time.

## 2024-02-13 ENCOUNTER — Ambulatory Visit (INDEPENDENT_AMBULATORY_CARE_PROVIDER_SITE_OTHER): Admitting: Student

## 2024-02-13 DIAGNOSIS — M25561 Pain in right knee: Secondary | ICD-10-CM | POA: Diagnosis not present

## 2024-02-13 MED ORDER — MELOXICAM 15 MG PO TABS: 15.0000 mg | ORAL_TABLET | Freq: Every day | ORAL | 0 refills | Status: AC
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
## 2024-02-23 ENCOUNTER — Encounter: Payer: Self-pay | Admitting: Radiology

## 2024-03-22 ENCOUNTER — Encounter: Payer: Self-pay | Admitting: Cardiology

## 2024-03-22 MED ORDER — HYDRALAZINE HCL 25 MG PO TABS: 25.0000 mg | ORAL_TABLET | Freq: Two times a day (BID) | ORAL | 1 refills | Status: AC

## 2024-05-01 ENCOUNTER — Other Ambulatory Visit (HOSPITAL_BASED_OUTPATIENT_CLINIC_OR_DEPARTMENT_OTHER): Payer: Self-pay | Admitting: Family Medicine

## 2024-05-01 DIAGNOSIS — K219 Gastro-esophageal reflux disease without esophagitis: Secondary | ICD-10-CM

## 2024-06-22 ENCOUNTER — Ambulatory Visit: Admitting: Cardiology
